# Patient Record
Sex: Male | Born: 1950 | Race: Black or African American | Hispanic: No | Marital: Single | Smoking: Current every day smoker
Health system: Southern US, Community
[De-identification: ages and names within clinical notes are randomized; demographics above are authoritative.]

## PROBLEM LIST (undated history)

## (undated) DIAGNOSIS — Z992 Dependence on renal dialysis: Secondary | ICD-10-CM

## (undated) DIAGNOSIS — C649 Malignant neoplasm of unspecified kidney, except renal pelvis: Secondary | ICD-10-CM

## (undated) DIAGNOSIS — D638 Anemia in other chronic diseases classified elsewhere: Secondary | ICD-10-CM

## (undated) DIAGNOSIS — N2581 Secondary hyperparathyroidism of renal origin: Secondary | ICD-10-CM

## (undated) DIAGNOSIS — N289 Disorder of kidney and ureter, unspecified: Secondary | ICD-10-CM

## (undated) DIAGNOSIS — C7951 Secondary malignant neoplasm of bone: Secondary | ICD-10-CM

## (undated) DIAGNOSIS — Z923 Personal history of irradiation: Secondary | ICD-10-CM

## (undated) DIAGNOSIS — S32599A Other specified fracture of unspecified pubis, initial encounter for closed fracture: Secondary | ICD-10-CM

## (undated) DIAGNOSIS — I10 Essential (primary) hypertension: Secondary | ICD-10-CM

## (undated) HISTORY — DX: Other specified fracture of unspecified pubis, initial encounter for closed fracture: S32.599A

## (undated) HISTORY — DX: Malignant neoplasm of unspecified kidney, except renal pelvis: C64.9

## (undated) HISTORY — PX: AV FISTULA PLACEMENT: SHX1204

## (undated) HISTORY — DX: Secondary malignant neoplasm of bone: C79.51

---

## 1998-01-01 ENCOUNTER — Other Ambulatory Visit: Admission: RE | Admit: 1998-01-01 | Discharge: 1998-01-01 | Payer: Self-pay | Admitting: *Deleted

## 1998-01-08 ENCOUNTER — Other Ambulatory Visit: Admission: RE | Admit: 1998-01-08 | Discharge: 1998-01-08 | Payer: Self-pay | Admitting: *Deleted

## 1998-01-15 ENCOUNTER — Other Ambulatory Visit: Admission: RE | Admit: 1998-01-15 | Discharge: 1998-01-15 | Payer: Self-pay | Admitting: *Deleted

## 1998-02-25 ENCOUNTER — Ambulatory Visit (HOSPITAL_COMMUNITY): Admission: RE | Admit: 1998-02-25 | Discharge: 1998-02-25 | Payer: Self-pay | Admitting: Nephrology

## 1998-03-24 ENCOUNTER — Other Ambulatory Visit: Admission: RE | Admit: 1998-03-24 | Discharge: 1998-03-24 | Payer: Self-pay | Admitting: *Deleted

## 1998-05-23 ENCOUNTER — Observation Stay (HOSPITAL_COMMUNITY): Admission: AD | Admit: 1998-05-23 | Discharge: 1998-05-23 | Payer: Self-pay | Admitting: *Deleted

## 1998-10-04 ENCOUNTER — Encounter: Payer: Self-pay | Admitting: *Deleted

## 1998-10-04 ENCOUNTER — Observation Stay (HOSPITAL_COMMUNITY): Admission: EM | Admit: 1998-10-04 | Discharge: 1998-10-04 | Payer: Self-pay | Admitting: Emergency Medicine

## 2002-04-13 ENCOUNTER — Emergency Department (HOSPITAL_COMMUNITY): Admission: EM | Admit: 2002-04-13 | Discharge: 2002-04-13 | Payer: Self-pay | Admitting: Emergency Medicine

## 2002-06-17 ENCOUNTER — Inpatient Hospital Stay (HOSPITAL_COMMUNITY): Admission: EM | Admit: 2002-06-17 | Discharge: 2002-06-22 | Payer: Self-pay | Admitting: Emergency Medicine

## 2002-06-17 ENCOUNTER — Encounter: Payer: Self-pay | Admitting: Emergency Medicine

## 2002-06-18 ENCOUNTER — Encounter: Payer: Self-pay | Admitting: Nephrology

## 2002-06-19 ENCOUNTER — Encounter: Payer: Self-pay | Admitting: Cardiology

## 2002-06-20 ENCOUNTER — Encounter: Payer: Self-pay | Admitting: Nephrology

## 2002-07-29 ENCOUNTER — Inpatient Hospital Stay (HOSPITAL_COMMUNITY): Admission: EM | Admit: 2002-07-29 | Discharge: 2002-08-01 | Payer: Self-pay | Admitting: *Deleted

## 2002-07-29 ENCOUNTER — Encounter: Payer: Self-pay | Admitting: *Deleted

## 2002-07-30 ENCOUNTER — Encounter: Payer: Self-pay | Admitting: Nephrology

## 2002-08-06 ENCOUNTER — Emergency Department (HOSPITAL_COMMUNITY): Admission: EM | Admit: 2002-08-06 | Discharge: 2002-08-06 | Payer: Self-pay | Admitting: *Deleted

## 2002-08-29 ENCOUNTER — Encounter: Payer: Self-pay | Admitting: Emergency Medicine

## 2002-08-29 ENCOUNTER — Emergency Department (HOSPITAL_COMMUNITY): Admission: EM | Admit: 2002-08-29 | Discharge: 2002-08-29 | Payer: Self-pay | Admitting: Emergency Medicine

## 2002-09-27 ENCOUNTER — Encounter: Admission: RE | Admit: 2002-09-27 | Discharge: 2002-09-27 | Payer: Self-pay | Admitting: Nephrology

## 2002-09-27 ENCOUNTER — Encounter: Payer: Self-pay | Admitting: Nephrology

## 2002-09-28 ENCOUNTER — Emergency Department (HOSPITAL_COMMUNITY): Admission: EM | Admit: 2002-09-28 | Discharge: 2002-09-28 | Payer: Self-pay | Admitting: Emergency Medicine

## 2002-09-28 ENCOUNTER — Encounter: Payer: Self-pay | Admitting: Emergency Medicine

## 2003-05-02 ENCOUNTER — Ambulatory Visit (HOSPITAL_COMMUNITY): Admission: RE | Admit: 2003-05-02 | Discharge: 2003-05-02 | Payer: Self-pay | Admitting: Vascular Surgery

## 2004-02-18 ENCOUNTER — Encounter (INDEPENDENT_AMBULATORY_CARE_PROVIDER_SITE_OTHER): Payer: Self-pay | Admitting: Specialist

## 2004-02-18 ENCOUNTER — Inpatient Hospital Stay (HOSPITAL_COMMUNITY): Admission: AD | Admit: 2004-02-18 | Discharge: 2004-02-22 | Payer: Self-pay | Admitting: General Surgery

## 2004-04-04 ENCOUNTER — Emergency Department (HOSPITAL_COMMUNITY): Admission: EM | Admit: 2004-04-04 | Discharge: 2004-04-05 | Payer: Self-pay | Admitting: Emergency Medicine

## 2004-07-19 ENCOUNTER — Inpatient Hospital Stay (HOSPITAL_COMMUNITY): Admission: EM | Admit: 2004-07-19 | Discharge: 2004-08-01 | Payer: Self-pay | Admitting: Emergency Medicine

## 2004-07-20 ENCOUNTER — Encounter: Payer: Self-pay | Admitting: Cardiology

## 2004-07-30 ENCOUNTER — Encounter (INDEPENDENT_AMBULATORY_CARE_PROVIDER_SITE_OTHER): Payer: Self-pay | Admitting: *Deleted

## 2004-08-23 ENCOUNTER — Encounter: Admission: RE | Admit: 2004-08-23 | Discharge: 2004-08-23 | Payer: Self-pay | Admitting: General Surgery

## 2005-01-24 IMAGING — CT CT ABDOMEN W/ CM
1 of 2 series · 14 of 32 positions shown, 18 images · IV contrast (GASTROGRAFIN & [ID] OMNI 300)
Comparison: none

CLINICAL DATA: Post cholecystectomy.  Episode of pancreatitis.  Evaluate for possible abdominal mass. 
 CT ABDOMEN WITH CONTRAST:
TECHNIQUE: Multidetector helical scans through the abdomen were performed after oral and IV contrast media were given.  100 cc of Omnipaque 300 were given as the contrast media.

[Series 2: a&p w/ · axial · 0.59mm/px · z∈[-405,-45]mm · 14 of 80 slices shown, 18 images]
[im 4/80  soft-tissue]
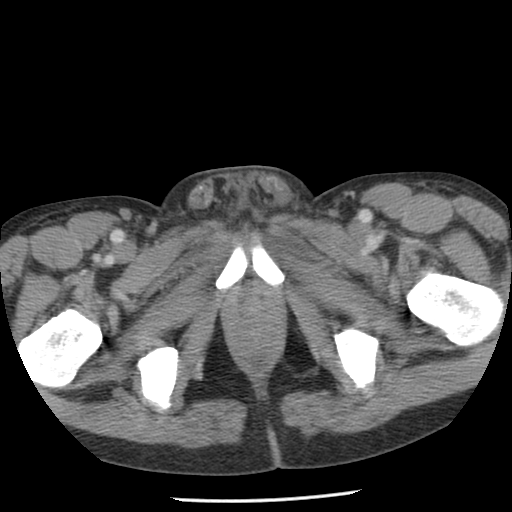
[im 4/80  bone]
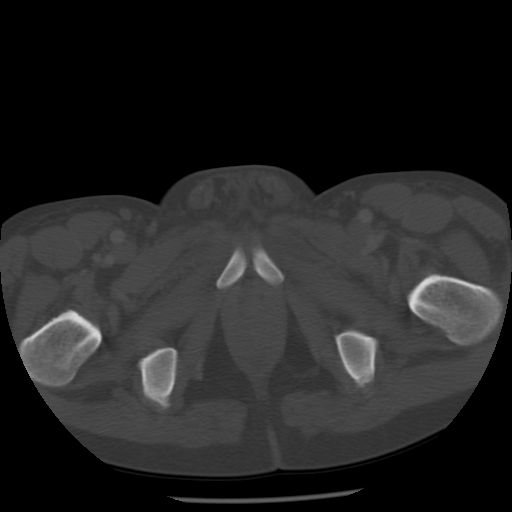
[im 11/80  soft-tissue]
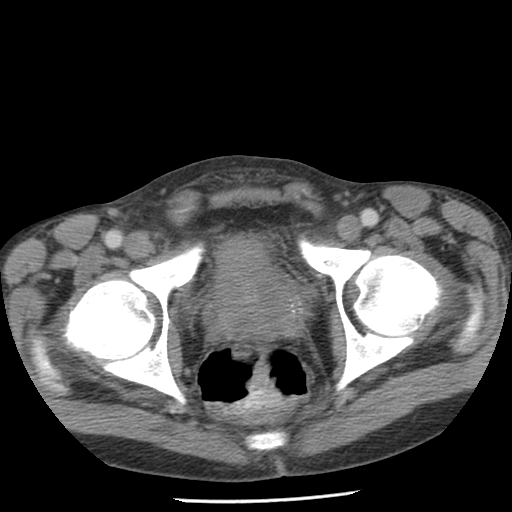
[im 18/80  soft-tissue]
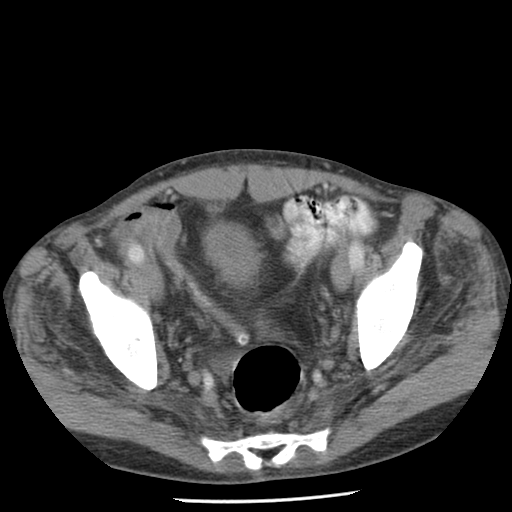
[im 25/80  soft-tissue]
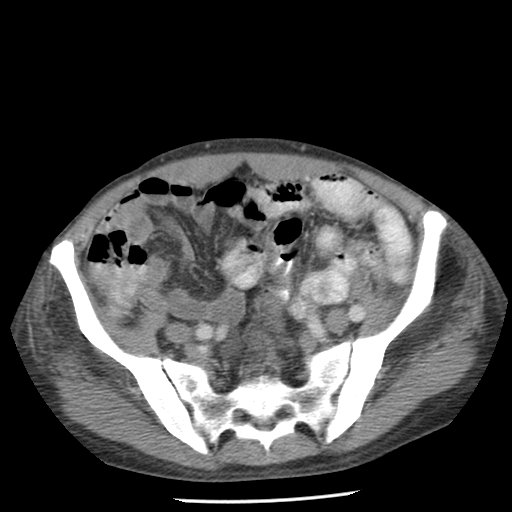
[im 31/80  soft-tissue]
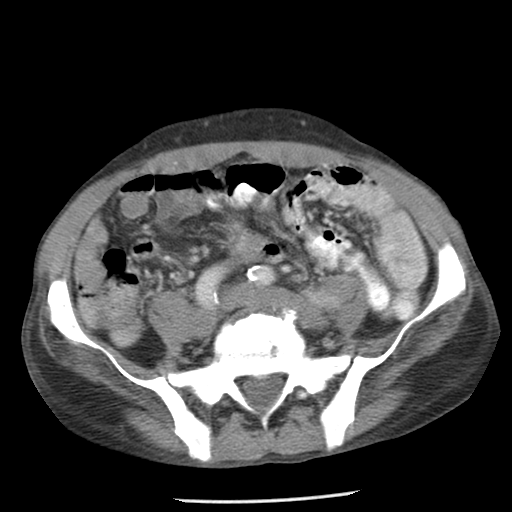
[im 38/80  soft-tissue]
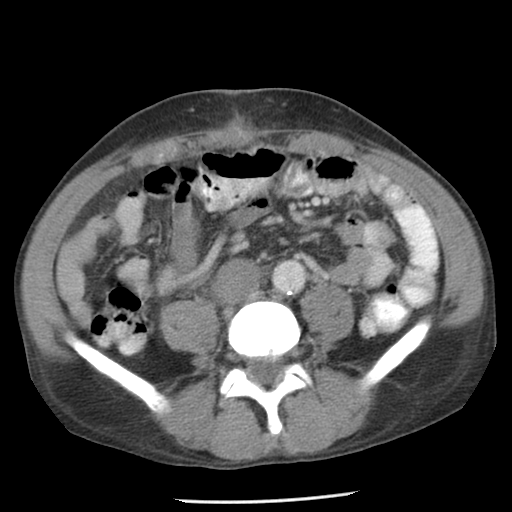
[im 42/80  soft-tissue]
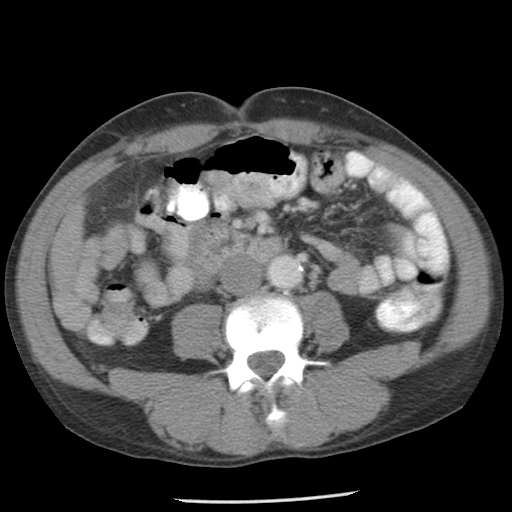
[im 49/80  soft-tissue]
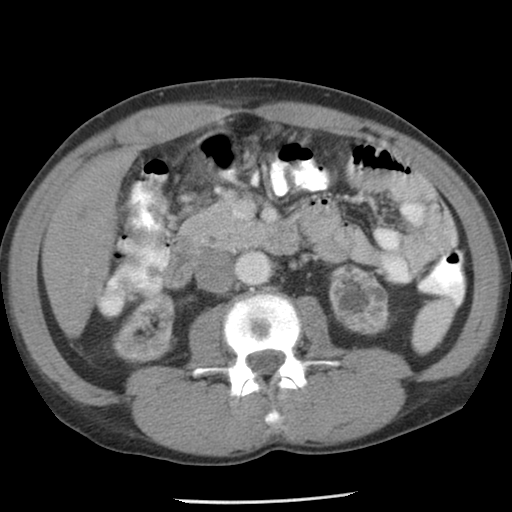
[im 55/80  soft-tissue]
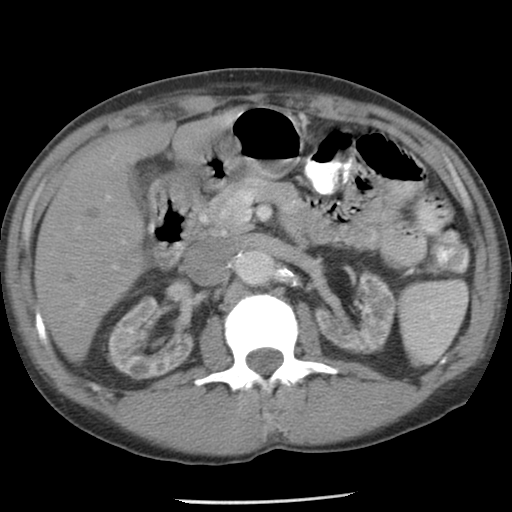
[im 55/80  bone]
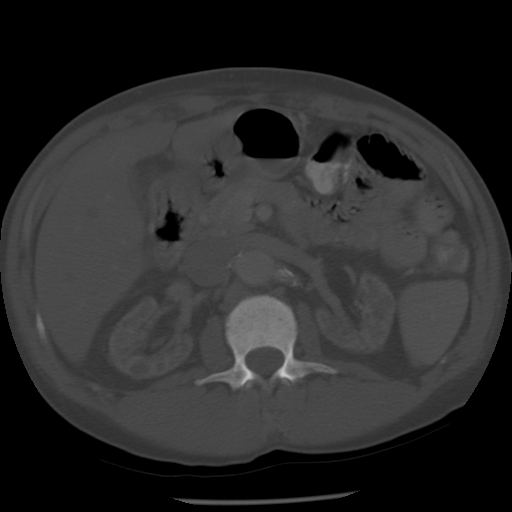
[im 62/80  soft-tissue]
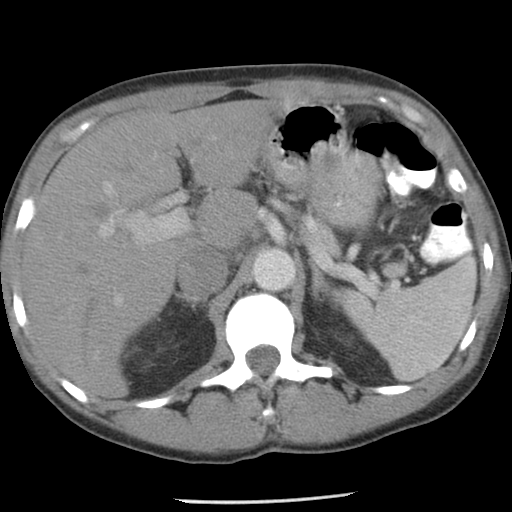
[im 66/80  lung]
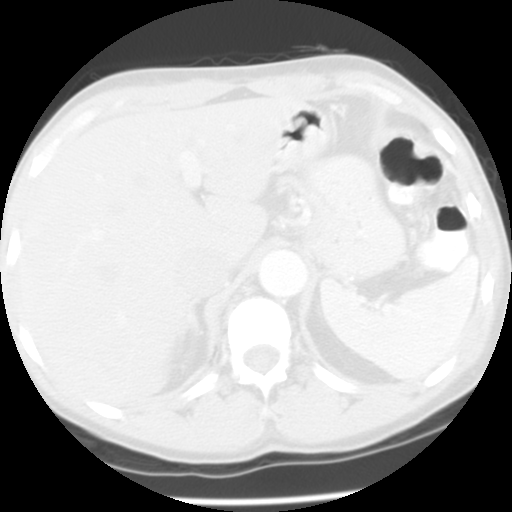
[im 69/80  soft-tissue]
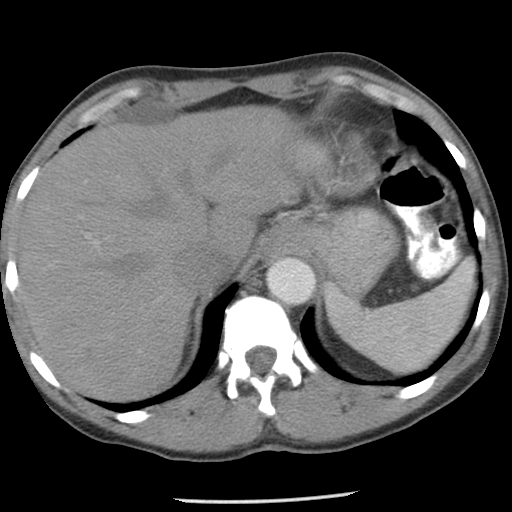
[im 69/80  lung]
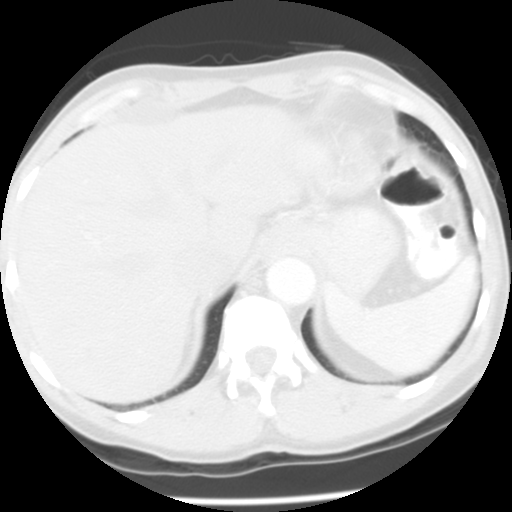
[im 73/80  lung]
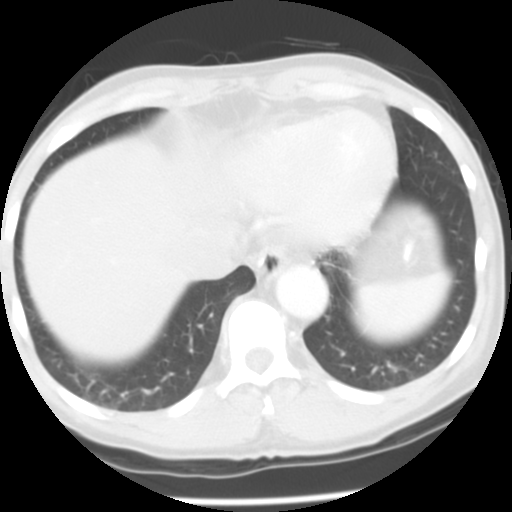
[im 76/80  soft-tissue]
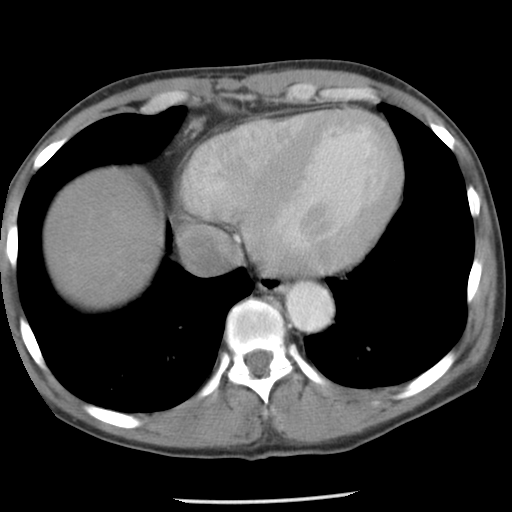
[im 76/80  lung]
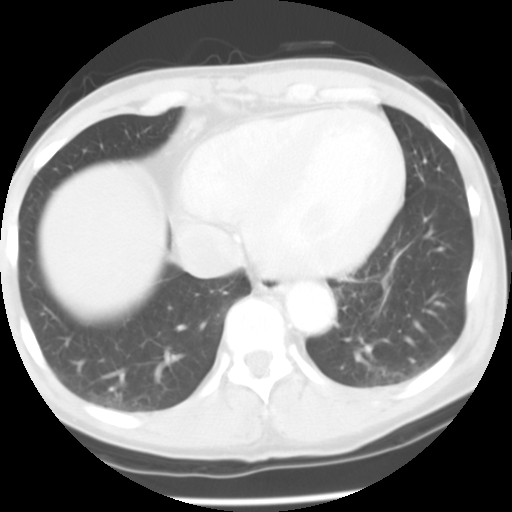

[14 of 32 positions shown; findings below may reference images not displayed]

The lung bases are clear.  There is a ununited old fracture involving a posterior left lower rib.  The heart is within the upper limits of normal.  The liver enhances normally with no focal abnormality and no ductal dilatation is seen.  There is a small fluid collection just anterior to the right lobe of liver of questionable significance.   No air is seen within this small amount of fluid which may be free within the peritoneal cavity. Surgical clips are present from prior cholecystectomy which was by history performed one month ago.  The pancreas is normal in size, and the pancreatic duct is not dilated.  The peripancreatic fat planes appear normal.  The adrenal glands are normal in size as is the spleen. The kidneys are small with parenchymal loss bilaterally as well as multiple renal cysts bilaterally.  No hydronephrosis is seen.  The abdominal aorta is normal in caliber.   There is a rounded area of higher attenuation within the right rectus abdominal muscle most consistent with a hematoma.  Clinical correlation is recommended.  No other evidence of abdominal mass is seen.
IMPRESSION: 1.  Rounded area of higher attenuation within the right rectus muscle may represent hematoma.  Correlate clinically.  No intra-abdominal mass is seen.
 2.    Small amount of fluid anterior to the liver of questionable significance.
 3.  Ununited old left posterior rib fracture.
 4.  Small kidneys with parenchymal loss and multiple cysts bilaterally.  

 CT PELVIS WITH CONTRAST:
 Scans were continued through the pelvis after oral and IV contrast media were given.  The iliac arteries are very tortuous.  There is a small amount of fluid within the pelvis.  The urinary bladder is nondistended in this renal failure patient, but does appear somewhat thick-walled.  A bladder lesion cannot be excluded with this exam.  There is some higher attenuation material within the bladder, and further evaluation of the urinary bladder may be warranted.
IMPRESSION: 1.  Nondistended and thick-walled urinary bladder.  I cannot exclude a bladder lesion.  With higher attenuation within the bladder, further evaluation may be warranted.
 2.  Small amount of fluid layering in the pelvis.

## 2005-01-25 ENCOUNTER — Inpatient Hospital Stay (HOSPITAL_COMMUNITY): Admission: EM | Admit: 2005-01-25 | Discharge: 2005-01-26 | Payer: Self-pay | Admitting: Emergency Medicine

## 2005-01-25 ENCOUNTER — Ambulatory Visit: Payer: Self-pay | Admitting: Internal Medicine

## 2005-06-13 ENCOUNTER — Inpatient Hospital Stay (HOSPITAL_COMMUNITY): Admission: EM | Admit: 2005-06-13 | Discharge: 2005-06-15 | Payer: Self-pay | Admitting: Emergency Medicine

## 2005-06-28 ENCOUNTER — Inpatient Hospital Stay (HOSPITAL_COMMUNITY): Admission: AD | Admit: 2005-06-28 | Discharge: 2005-07-01 | Payer: Self-pay | Admitting: Nephrology

## 2005-07-02 ENCOUNTER — Emergency Department (HOSPITAL_COMMUNITY): Admission: EM | Admit: 2005-07-02 | Discharge: 2005-07-02 | Payer: Self-pay | Admitting: Emergency Medicine

## 2005-07-04 ENCOUNTER — Ambulatory Visit (HOSPITAL_COMMUNITY): Admission: RE | Admit: 2005-07-04 | Discharge: 2005-07-04 | Payer: Self-pay | Admitting: Vascular Surgery

## 2005-07-11 ENCOUNTER — Encounter: Payer: Self-pay | Admitting: Emergency Medicine

## 2005-07-11 ENCOUNTER — Inpatient Hospital Stay (HOSPITAL_COMMUNITY): Admission: EM | Admit: 2005-07-11 | Discharge: 2005-07-14 | Payer: Self-pay | Admitting: Emergency Medicine

## 2005-08-02 ENCOUNTER — Inpatient Hospital Stay (HOSPITAL_COMMUNITY): Admission: EM | Admit: 2005-08-02 | Discharge: 2005-08-04 | Payer: Self-pay | Admitting: Emergency Medicine

## 2005-09-19 ENCOUNTER — Ambulatory Visit (HOSPITAL_COMMUNITY): Admission: RE | Admit: 2005-09-19 | Discharge: 2005-09-19 | Payer: Self-pay | Admitting: Vascular Surgery

## 2005-12-02 ENCOUNTER — Ambulatory Visit (HOSPITAL_COMMUNITY): Admission: RE | Admit: 2005-12-02 | Discharge: 2005-12-02 | Payer: Self-pay | Admitting: Nephrology

## 2006-03-01 ENCOUNTER — Ambulatory Visit (HOSPITAL_COMMUNITY): Admission: RE | Admit: 2006-03-01 | Discharge: 2006-03-01 | Payer: Self-pay | Admitting: Nephrology

## 2006-06-03 ENCOUNTER — Inpatient Hospital Stay (HOSPITAL_COMMUNITY): Admission: EM | Admit: 2006-06-03 | Discharge: 2006-06-04 | Payer: Self-pay | Admitting: Emergency Medicine

## 2006-06-03 ENCOUNTER — Ambulatory Visit: Payer: Self-pay | Admitting: *Deleted

## 2006-06-04 ENCOUNTER — Encounter: Payer: Self-pay | Admitting: Internal Medicine

## 2006-06-04 ENCOUNTER — Emergency Department (HOSPITAL_COMMUNITY): Admission: EM | Admit: 2006-06-04 | Discharge: 2006-06-04 | Payer: Self-pay | Admitting: Emergency Medicine

## 2006-06-14 ENCOUNTER — Ambulatory Visit: Payer: Self-pay

## 2006-06-26 ENCOUNTER — Emergency Department (HOSPITAL_COMMUNITY): Admission: EM | Admit: 2006-06-26 | Discharge: 2006-06-26 | Payer: Self-pay | Admitting: Emergency Medicine

## 2006-12-04 ENCOUNTER — Emergency Department (HOSPITAL_COMMUNITY): Admission: EM | Admit: 2006-12-04 | Discharge: 2006-12-04 | Payer: Self-pay | Admitting: Emergency Medicine

## 2008-08-11 ENCOUNTER — Ambulatory Visit: Payer: Self-pay | Admitting: Surgery

## 2008-08-14 ENCOUNTER — Ambulatory Visit: Payer: Self-pay | Admitting: Surgery

## 2008-08-14 ENCOUNTER — Ambulatory Visit (HOSPITAL_COMMUNITY): Admission: RE | Admit: 2008-08-14 | Discharge: 2008-08-14 | Payer: Self-pay | Admitting: Surgery

## 2008-08-22 ENCOUNTER — Ambulatory Visit (HOSPITAL_COMMUNITY): Admission: RE | Admit: 2008-08-22 | Discharge: 2008-08-22 | Payer: Self-pay | Admitting: Surgery

## 2008-11-11 ENCOUNTER — Ambulatory Visit (HOSPITAL_COMMUNITY): Admission: RE | Admit: 2008-11-11 | Discharge: 2008-11-11 | Payer: Self-pay | Admitting: Nephrology

## 2009-11-19 ENCOUNTER — Emergency Department (HOSPITAL_COMMUNITY): Admission: EM | Admit: 2009-11-19 | Discharge: 2009-11-19 | Payer: Self-pay | Admitting: Emergency Medicine

## 2009-11-19 ENCOUNTER — Ambulatory Visit: Payer: Self-pay | Admitting: Vascular Surgery

## 2009-12-02 ENCOUNTER — Encounter: Admission: RE | Admit: 2009-12-02 | Discharge: 2009-12-02 | Payer: Self-pay | Admitting: Nephrology

## 2010-10-20 ENCOUNTER — Ambulatory Visit
Admission: RE | Admit: 2010-10-20 | Discharge: 2010-10-20 | Payer: Self-pay | Source: Home / Self Care | Attending: Vascular Surgery | Admitting: Vascular Surgery

## 2010-10-22 ENCOUNTER — Ambulatory Visit (HOSPITAL_COMMUNITY)
Admission: RE | Admit: 2010-10-22 | Discharge: 2010-10-22 | Payer: Self-pay | Source: Home / Self Care | Attending: Vascular Surgery | Admitting: Vascular Surgery

## 2010-10-24 ENCOUNTER — Encounter: Payer: Self-pay | Admitting: Nephrology

## 2010-10-25 LAB — POCT I-STAT 4, (NA,K, GLUC, HGB,HCT)
Glucose, Bld: 95 mg/dL (ref 70–99)
Hemoglobin: 18 g/dL — ABNORMAL HIGH (ref 13.0–17.0)
Sodium: 138 mEq/L (ref 135–145)

## 2010-10-26 NOTE — Op Note (Signed)
NAME:  Lance, Waller               ACCOUNT NO.:  [id number]  MEDICAL RECORD NO.:  [id number]          PATIENT TYPE:  AMB  LOCATION:  SDS                          FACILITY:  [facility]  PHYSICIAN:  Di Kindle. 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  PROCEDURE:  Revision of left upper arm AV fistula.  SURGEON:  Di Kindle. Edilia Bo, MD  ASSISTANT:  Nurse.  ANESTHESIA:  Local with sedation.  INDICATIONS:  This is a 60 year old gentleman who had a left upper arm AV fistula for several years.  He had revision of the fistula with an interposition segment of PTFE graft approximately a year ago for an aneurysm.  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  This is a large aneurysm with an eschar with significant risk of bleeding.  He is therefore brought in for revision of the fistula with the plan to save enough fistula above the revision and enough access below the revision to allow continued access with this graft.  TECHNIQUE:  The patient was taken to the operating room and was sedated by anesthesia.  Left upper extremity was prepped and draped in the usual sterile fashion.  After the skin was infiltrated with 1% lidocaine, a transverse incision was made below the aneurysm where the previously revised graft was dissected free.  Again, after the skin was anesthetized with 1% lidocaine above the aneurysm, the AV fistula was dissected free.  Tunnel was then created between these two incisions and a 7-mm PTFE graft was tunneled between the two incisions and the patient was then heparinized.  The graft distally was clamped and divided.  It was slightly spatulated.  The 7-mm graft was slightly  spatulated the two were sewn end-to-end with continuous 6-0 Prolene suture.  Next, the Fogarty catheter was passed proximally multiple times and there was no clot retrieved.  This was then flushed with heparinized saline and clamped.  Next, at the venous end, the vein was patent.  I passed a Fogarty catheter multiple times, and there was no problem distally.  The new segment graft was cut to the appropriate length and sewn end-to-end to the vein using continuous 6-0 Prolene suture.  At the completion, there was a good thrill in the graft.  There was enough room to access the graft above the revision and also below the revision.  I then excised the large aneurysm with the eschar using an elliptical incision. This incision was closed with interrupted 3-0 nylons.  The incision for the revision were closed with a deep layer of 3-0 Vicryl and the skin closed with 4-0 Vicryl.  Sterile dressing was applied.  The patient tolerated the procedure well, was transferred to the recovery room in stable condition.  All needle and sponge counts were correct.     Di Kindle. Edilia Bo, M.D.     CSD/MEDQ  D:  10/22/2010  T:  10/23/2010  Job:  [id number]  Electronically Signed by Waverly Ferrari M.D. on 10/26/2010 03:26:05 PM

## 2010-11-07 ENCOUNTER — Inpatient Hospital Stay (INDEPENDENT_AMBULATORY_CARE_PROVIDER_SITE_OTHER)
Admission: RE | Admit: 2010-11-07 | Discharge: 2010-11-07 | Disposition: A | Discharge status: Home / Self Care | Payer: Medicare Other | Source: Ambulatory Visit | Attending: Family Medicine | Admitting: Family Medicine

## 2010-11-07 DIAGNOSIS — T82898A Other specified complication of vascular prosthetic devices, implants and grafts, initial encounter: Secondary | ICD-10-CM

## 2010-11-10 ENCOUNTER — Ambulatory Visit (INDEPENDENT_AMBULATORY_CARE_PROVIDER_SITE_OTHER): Payer: Medicare Other | Admitting: Vascular Surgery

## 2010-11-10 DIAGNOSIS — T82898A Other specified complication of vascular prosthetic devices, implants and grafts, initial encounter: Secondary | ICD-10-CM

## 2010-11-10 DIAGNOSIS — N186 End stage renal disease: Secondary | ICD-10-CM

## 2010-11-12 ENCOUNTER — Ambulatory Visit (INDEPENDENT_AMBULATORY_CARE_PROVIDER_SITE_OTHER): Payer: Medicare Other

## 2010-11-12 DIAGNOSIS — N186 End stage renal disease: Secondary | ICD-10-CM

## 2010-11-12 DIAGNOSIS — T82898A Other specified complication of vascular prosthetic devices, implants and grafts, initial encounter: Secondary | ICD-10-CM

## 2010-11-12 NOTE — Assessment & Plan Note (Signed)
OFFICE VISIT  SAJJAD, HONEA DOB:  11-13-1950                                       11/12/2010 EAVWU#:[id number]  The date of surgery was 10/22/2010.  The patient is a 59 year old African American male who was recently seen on 11/10/2010 by Dr. Edilia Bo in the office for evaluation of an open wound in the left upper arm. The patient has a left upper arm AV fistula that has been in place for several years.  He had previously undergone revision of the fistula with graft approximately a year ago.  He developed an aneurysm central to the previous revision with an eschar with significant risk for bleeding.  On January 20 Dr. Edilia Bo took him to the OR and bypassed around the aneurysm and excised the aneurysm eschar.  It is this area that the wound had opened and Dr. Edilia Bo evaluated in the office on 11/10/2010. The wound was irrigated and debrided and he was instructed to pack it daily with wet-to-dry dressings.  There was no evidence of infection and therefore the patient was instructed to follow up in one week.  The home health nursing was set up and the patient's wound has been packed according to home health.  The home health nurse did call the office today with concerns of possible infection and "foul odor" from the wound.  PHYSICAL EXAMINATION:  Vital signs:  Temperature 97.9, blood pressure 112/77, heart rate 75.  The wound is inspected and there is some granulation present.  The wound edges are clean.  There is no evidence of erythema or significant drainage.  There is tunneling present at the 10 o'clock and 12 o'clock positions in the wound.  There is no foul odor.  Of note, when the nurse took the dressing down to expose the wound she stated that it was packed with cotton balls.  We are not certain who did that.  The patient states when the home health is not there on the weekends that his niece packs the wound for him.  He is still going to dialysis  on Tuesday, Thursday, Saturday and the fistula is being cannulated on those days.  Secondary to the fistula being utilized on a regular basis and being cannulated on a regular basis I placed the patient on a 7 day course of Keflex.  I also changed the dressing to quarter inch iodoform gauze to be packed once a day with a dry dressing over the top.  This was changed secondary to the tunneling present and the ease of being able to pack the iodoform gauze into the tunnels.  The patient is to call with any new questions, issues or problems.  Otherwise he will keep his scheduled followup with Dr. Edilia Bo.  Pecola Leisure, PA  Di Kindle. Edilia Bo, M.D. Electronically Signed  AY/MEDQ  D:  11/12/2010  T:  11/12/2010  Job:  [id number]

## 2010-11-12 NOTE — Assessment & Plan Note (Signed)
OFFICE VISIT  GLENNON, KOPKO DOB:  12/09/1950                                       11/10/2010 EAVWU#:[id number]  I saw the patient in the office today with a wound in his left upper arm.  This is a 60 year old gentleman who had a left upper arm AV fistula for several years.  He had previously had a revision of the fistula with an interposition segment PTFE graft approximately a year ago.  He developed another aneurysm central to the previous revision with an eschar over this with significant risk for bleeding.  On 10/22/2010, I took him to the OR and bypassed around this large aneurysm with the eschar and then excised the aneurysm and the area with the eschar.  He comes in today because he has no open wound where the aneurysm was resected.  On examination, temperature is 98.2, blood pressure 127/80, heart rate is 81.  The fistula has an excellent bruit and thrill and is functioning nicely.  The area over the aneurysm excised, has separated some and this was washed out and then packed wet-to-dry.  He will need wet-to-dry dressing changes daily and I will plan on seeing him back in 1 week.  The graft is lateral to this area of the wound so hopefully we can get this area to heal and still salvage this graft.  Infected aneurysm.    Di Kindle. Edilia Bo, M.D. Electronically Signed  CSD/MEDQ  D:  11/10/2010  T:  11/11/2010  Job:  [id number]  cc:   [facility]

## 2010-11-17 ENCOUNTER — Ambulatory Visit: Payer: Medicare Other | Admitting: Vascular Surgery

## 2010-12-01 ENCOUNTER — Ambulatory Visit (INDEPENDENT_AMBULATORY_CARE_PROVIDER_SITE_OTHER): Payer: Medicare Other | Admitting: Vascular Surgery

## 2010-12-01 DIAGNOSIS — N186 End stage renal disease: Secondary | ICD-10-CM

## 2010-12-02 NOTE — Assessment & Plan Note (Signed)
OFFICE VISIT  DAMARIEN, NYMAN DOB:  September 09, 1951                                       12/01/2010 [id number]#:[id number]  I saw the patient for continued follow-up of his left arm wound.  He had a left upper arm fistula revised on 10/22/2010.  He had an aneurysm excised, then developed a wound at the site of his old aneurysm.  This has been packed and he comes in for a routine wound check.  This has continued to granulate in nicely.  There has been no drainage or evidence of infection.  The graft has been working well and currently it takes minimal packing.  We will have the home health nurse continue his dressing changes and I will see him back in 3 weeks and at that point I think it will have completely healed.    Di Kindle. Edilia Bo, M.D. Electronically Signed  CSD/MEDQ  D:  12/01/2010  T:  12/02/2010  Job:  [id number]

## 2010-12-06 ENCOUNTER — Ambulatory Visit (HOSPITAL_COMMUNITY)
Admission: RE | Admit: 2010-12-06 | Discharge: 2010-12-06 | Disposition: A | Discharge status: Home / Self Care | Payer: Medicare Other | Source: Ambulatory Visit | Attending: Nephrology | Admitting: Nephrology

## 2010-12-06 ENCOUNTER — Other Ambulatory Visit (HOSPITAL_COMMUNITY): Payer: Self-pay | Admitting: Nephrology

## 2010-12-06 DIAGNOSIS — N186 End stage renal disease: Secondary | ICD-10-CM

## 2010-12-06 DIAGNOSIS — Z452 Encounter for adjustment and management of vascular access device: Secondary | ICD-10-CM | POA: Insufficient documentation

## 2010-12-06 LAB — BASIC METABOLIC PANEL
BUN: 77 mg/dL — ABNORMAL HIGH (ref 6–23)
Creatinine, Ser: 14.61 mg/dL — ABNORMAL HIGH (ref 0.4–1.5)
GFR calc Af Amer: 4 mL/min — ABNORMAL LOW (ref >60)
GFR calc non Af Amer: 3 mL/min — ABNORMAL LOW (ref >60)
Potassium: 5.2 mEq/L — ABNORMAL HIGH (ref 3.5–5.1)

## 2010-12-07 ENCOUNTER — Ambulatory Visit: Payer: Medicare Other | Admitting: Vascular Surgery

## 2010-12-22 ENCOUNTER — Ambulatory Visit (INDEPENDENT_AMBULATORY_CARE_PROVIDER_SITE_OTHER): Payer: Medicare Other | Admitting: Vascular Surgery

## 2010-12-22 ENCOUNTER — Encounter (INDEPENDENT_AMBULATORY_CARE_PROVIDER_SITE_OTHER): Payer: Medicare Other

## 2010-12-22 DIAGNOSIS — N186 End stage renal disease: Secondary | ICD-10-CM

## 2010-12-22 DIAGNOSIS — Z01810 Encounter for preprocedural cardiovascular examination: Secondary | ICD-10-CM

## 2010-12-22 LAB — BASIC METABOLIC PANEL
Calcium: 8.5 mg/dL (ref 8.4–10.5)
Creatinine, Ser: 5.32 mg/dL — ABNORMAL HIGH (ref 0.4–1.5)
GFR calc Af Amer: 14 mL/min — ABNORMAL LOW (ref >60)
Sodium: 143 mEq/L (ref 135–145)

## 2010-12-22 LAB — DIFFERENTIAL
Lymphocytes Relative: 24 % (ref 12–46)
Lymphs Abs: 2 10*3/uL (ref 0.7–4.0)
Monocytes Relative: 12 % (ref 3–12)
Neutro Abs: 5.2 10*3/uL (ref 1.7–7.7)
Neutrophils Relative %: 61 % (ref 43–77)

## 2010-12-22 LAB — CBC
Hemoglobin: 11.9 g/dL — ABNORMAL LOW (ref 13.0–17.0)
RBC: 4.24 MIL/uL (ref 4.22–5.81)
WBC: 8.4 10*3/uL (ref 4.0–10.5)

## 2010-12-22 LAB — APTT: aPTT: 30 seconds (ref 24–37)

## 2010-12-22 LAB — PROTIME-INR: INR: 0.99 (ref 0.00–1.49)

## 2010-12-23 NOTE — Assessment & Plan Note (Signed)
OFFICE VISIT  GIBBS, NAUGLE DOB:  [birth year]                                       12/22/2010 [id number]#:[id number]  I saw the patient in the office today to follow up his left arm wound. He had a left upper arm AV fistula for several years.  He had undergone a revision previously for aneurysm with a segment interposition PTFE. He developed a subsequent aneurysm which eroded through the skin and required another revision with PTFE.  He developed a wound which had been gradually healing.  He returned to have his wound checked and tells me that his graft clotted, and he had a catheter placed by Interventional Radiology.  It was felt that there were no further options to salvage the left upper arm access.  He has had no fever or chills.  On physical examination, this is a pleasant 60 year old gentleman who appears his stated age.  Temperature is 98.2.  Blood pressure 116/85, heart rate is 80.  His upper arm access now is occluded.  He has a palpable brachial and radial pulse bilaterally.  We mapped his remaining veins in both arms.  The upper arm cephalic vein on the right is quite small with diameters ranging from 0.12 cm to 0.24 cm.  The right basilic vein has sizes ranging from 0.27 to 0.55 cm.  The upper arm cephalic vein is occluded.  The basilic vein on the left has diameters ranging from 0.3 to 0.4 cm.  It appears that his best option for a fistula would be a basilic vein transposition on the right if this vein was found be adequate.  If this is not adequate, we could also examine at least the upper arm cephalic vein on the right.  If neither are adequate, he would have to have an AV graft placement in the right arm.  He is a Tuesday, Thursday, Saturday dialysis and we would schedule this for December 24, 2010.    Di Kindle. Edilia Bo, M.D. Electronically Signed  CSD/MEDQ  D:  12/22/2010  T:  12/23/2010  Job:  [id number]  cc:   [facility]

## 2010-12-24 ENCOUNTER — Ambulatory Visit (HOSPITAL_COMMUNITY)
Admission: RE | Admit: 2010-12-24 | Discharge: 2010-12-24 | Disposition: A | Discharge status: Home / Self Care | Payer: Medicare Other | Source: Ambulatory Visit | Attending: Vascular Surgery | Admitting: Vascular Surgery

## 2010-12-24 ENCOUNTER — Ambulatory Visit (HOSPITAL_COMMUNITY): Payer: Medicare Other

## 2010-12-24 DIAGNOSIS — Z992 Dependence on renal dialysis: Secondary | ICD-10-CM | POA: Insufficient documentation

## 2010-12-24 DIAGNOSIS — N186 End stage renal disease: Secondary | ICD-10-CM

## 2010-12-24 DIAGNOSIS — F17200 Nicotine dependence, unspecified, uncomplicated: Secondary | ICD-10-CM | POA: Insufficient documentation

## 2010-12-24 DIAGNOSIS — Z01812 Encounter for preprocedural laboratory examination: Secondary | ICD-10-CM | POA: Insufficient documentation

## 2010-12-24 DIAGNOSIS — I120 Hypertensive chronic kidney disease with stage 5 chronic kidney disease or end stage renal disease: Secondary | ICD-10-CM | POA: Insufficient documentation

## 2010-12-24 DIAGNOSIS — Z01818 Encounter for other preprocedural examination: Secondary | ICD-10-CM | POA: Insufficient documentation

## 2010-12-24 LAB — SURGICAL PCR SCREEN
MRSA, PCR: NEGATIVE
Staphylococcus aureus: NEGATIVE

## 2010-12-24 LAB — POCT I-STAT 4, (NA,K, GLUC, HGB,HCT)
Hemoglobin: 13.6 g/dL (ref 13.0–17.0)
Sodium: 141 mEq/L (ref 135–145)

## 2011-01-01 NOTE — Procedures (Unsigned)
CEPHALIC VEIN MAPPING  INDICATION:  End-stage renal disease, dialysis access.  HISTORY: Current smoker, hypertension.  EXAM: The right cephalic vein is not compressible from proximal forearm to wrist.  The right cephalic vein is compressible from antecubital fossa to proximal upper arm.  Diameter measurements range from 0.12 to 0.29 cm.  The right basilic vein is compressible.  Diameter measurements range from 0.27 to 0.75 cm.  The left cephalic vein is not compressible.  No diameter measurements obtained.  The left basilic vein is compressible.  Diameter measurements range from 0.15 to 0.40 cm.  See attached worksheet for all measurements.  IMPRESSION: 1. Patent bilateral basilic veins with diameter measurements as     described above. 2. Right cephalic vein thrombosed from proximal forearm to wrist. 3. The left cephalic vein is thrombosed from proximal to distal upper     arm segment.  ___________________________________________ Di Kindle. Edilia Bo, M.D.  SH/MEDQ  D:  12/22/2010  T:  12/22/2010  Job:  [id number]

## 2011-01-04 NOTE — Op Note (Signed)
  NAME:  [name], [place]FF[place] NO.:  [id number]  MEDICAL RECORD NO.:  [id number]           PATIENT TYPE:  O  LOCATION:  SDSC                         FACILITY:  [facility]  PHYSICIAN:  Di Kindle. Edilia Bo, M.D.DATE OF BIRTH:  1951-09-20  DATE OF PROCEDURE:  12/24/2010 DATE OF DISCHARGE:  12/24/2010                              OPERATIVE REPORT   PREOPERATIVE DIAGNOSIS:  Chronic kidney disease.  POSTOPERATIVE DIAGNOSIS:  Chronic kidney disease.  PROCEDURE:  Right basilic vein transposition.  SURGEON:  Di Kindle. Edilia Bo, MD  ASSISTANT:  Pecola Leisure, PA  ANESTHESIA:  General.  TECHNIQUE:  The patient was taken to the operating room and the right upper extremity was prepped and draped in usual sterile fashion.  The vein had been marked with the duplex scanner.  Using four incisions along the medial aspect of the right upper arm, the basilic vein was harvested from beyond the antecubital space up to the axilla.  Branches were brought between clips and 3-0 silk ties.  The vein was fully mobilized.  Through the distal incision, the brachial artery was exposed and had a good pulse.  A tunnel was then created from the distal incision to the axillary incision and the vein was tunneled between the two incisions.  The patient was then heparinized.  The brachial artery was clamped proximally and distally and longitudinal arteriotomy was made.  The vein was spatulated, cut to appropriate length, and sewn end- to-side to the artery using continuous 6-0 Prolene suture.  At completion, there was an excellent thrill in the fistula and a radial signal with a Doppler.  Hemostasis was obtained in the wounds.  The wounds were closed with deep layer of 3-0 Vicryl and skin closed with 4- 0 Vicryl.  Sterile dressing was applied.  The patient tolerated the procedure well, was transferred to the recovery room in stable condition.  All needle and sponge counts were  correct.     Di Kindle. Edilia Bo, M.D.     CSD/MEDQ  D:  12/24/2010  T:  12/25/2010  Job:  [id number]  Electronically Signed by Waverly Ferrari M.D. on 01/04/2011 07:42:04 AM

## 2011-01-06 ENCOUNTER — Ambulatory Visit: Payer: Self-pay

## 2011-02-04 ENCOUNTER — Ambulatory Visit: Payer: Medicare Other

## 2011-02-04 ENCOUNTER — Ambulatory Visit (INDEPENDENT_AMBULATORY_CARE_PROVIDER_SITE_OTHER): Payer: Medicare Other

## 2011-02-04 DIAGNOSIS — N186 End stage renal disease: Secondary | ICD-10-CM

## 2011-02-04 NOTE — Assessment & Plan Note (Signed)
OFFICE VISIT  JAKOLBY, SEDIVY DOB:  1951/05/12                                       02/04/2011 ZOXWR#:[id number]  Dr. Imogene Burn is the attending physician in the office today on 02/04/2011.  CHIEF COMPLAINT:  Postop check right basilic vein transposition done on 12/24/2010.  The patient is a 60 year old gentleman who has been on hemodialysis and has multiple accesses in the left and right arms.  He had a right basilic vein transposition 12/24/2010.  He has been doing well with no signs of steal.  He denies any numbness, tingling or inability to use his right hand.  He states he has a good grip on that side.  PHYSICAL EXAM:  This is a well-developed, well-nourished gentleman in no acute distress.  His O2 sats were 100%.  His heart rate was 71.  His respiratory rate was 15.  He had a good thrill and bruit in the right upper arm basilic vein transposition.  It was easily palpable and he had good and equal strength in bilateral upper extremities.  The patient has a nicely maturing basilic vein transposition which is easily palpable with a good thrill and bruit and no signs of steal. They would be able to access this fistula at the end of June at the 3 month mark and he will follow up with [place] as needed.  Della Goo, PA-C  Fransisco Hertz, MD Electronically Signed  RR/MEDQ  D:  02/04/2011  T:  02/04/2011  Job:  [id number]

## 2011-02-15 NOTE — Op Note (Signed)
NAME:  [name], [name]               ACCOUNT NO.:  [id number]   MEDICAL RECORD NO.:  [id number]          PATIENT TYPE:  AMB   LOCATION:  SDS                          FACILITY:  [facility]   PHYSICIAN:  Juleen China IV, MDDATE OF BIRTH:  Mar 12, 1951   DATE OF PROCEDURE:  08/14/2008  DATE OF DISCHARGE:                               OPERATIVE REPORT   PREOPERATIVE DIAGNOSIS:  Ulceration, left arm fistula.   POSTOPERATIVE DIAGNOSIS:  Ulceration, left arm fistula.   PROCEDURE PERFORMED:  1. Ultrasound-guided access, left cephalic vein.  2. Fistulogram.   COMPLICATIONS:  None.   FINDINGS:  No central stenosis.   PROCEDURE:  The patient was identified in the holding area and taken to  room 8.  He was placed supine on the table.  The left arm was prepped  and draped in standard sterile fashion.  A time-out was called.  Lidocaine 1% was used for local anesthesia.  The left cephalic fistula  was evaluated with ultrasound and found to be easily compressible and  widely patent.  It was accessed under ultrasound guidance with a  micropuncture needle.  An 0.018 mandrel wire was advanced into the  fistula under fluoroscopic visualization.  A 5-French stiff  micropuncture sheath was placed.  Contrast was then injected through the  sheath.   FINDINGS:  The cephalic vein fistula was widely patent.  There was no  evidence of central stenosis.   With these findings, decision was made to terminate the procedure.  Sheath was pulled.  The patient was taken to holding area in stable  condition.   IMPRESSION:  No evidence of central venous stenosis.           ______________________________  V. Charlena Cross, MD  Electronically Signed     VWB/MEDQ  D:  08/14/2008  T:  08/14/2008  Job:  [id number]

## 2011-02-15 NOTE — Assessment & Plan Note (Signed)
OFFICE VISIT   [name], [name]  DOB:  03-Mar-1951                                       08/11/2008  JWJXB#:[id number]   REASON FOR VISIT:  Evaluate pseudoaneurysm.   HISTORY:  This is a 60 year old gentleman whom I am seeing for  evaluation of left upper arm pseudoaneurysm with ulceration, the patient  had a left upper arm AV fistula placed by Dr. Arbie Cookey several years ago.  He now states that he has had a sore with scabbing over a dilated  portion of the graft for several weeks.  He denies having any fevers or  chills.  He is not having significant problems with bleeding at  dialysis.   PHYSICAL EXAMINATION:  Blood pressure 148/78 temperature is 97.  He is  in no acute distress.  The left upper arm fistula has several dilated  areas, the medial one has a ulcerated area of scabbing.  He has a good  thrill within the fistula and a palpable radial pulse.   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   PLAN:  The patient will be scheduled to undergo left upper arm  fistulogram to evaluate for central stenosis as the etiology of his  pseudoaneurysm and ulceration.  If at that time I find a lesion, it will  be treated with balloon venoplasty.  Again, this has been scheduled for  Thursday, November 12.  We will do this as the first case so that he can  make dialysis later that afternoon.   Once we have evaluated and possibly treated his central stenosis, he  will need to have his graft revised.  I am scheduling him for a revision  of his left arm fistula on Friday, November 20.  I plan on potentially  evaluating and putting in an interposition vein graft.  However, most  likely this will require an interposition Gore-Tex graft.  Details of  the procedure discussed with the patient.   Jorge Ny, MD  Electronically Signed   VWB/MEDQ  D:  08/11/2008  T:  08/12/2008  Job:  [id number]   cc:   [facility]

## 2011-02-15 NOTE — Op Note (Signed)
NAME:  [name], [name]               ACCOUNT NO.:  [id number]   MEDICAL RECORD NO.:  [id number]          PATIENT TYPE:  AMB   LOCATION:  SDS                          FACILITY:  [facility]   PHYSICIAN:  Juleen China IV, MDDATE OF BIRTH:  1951/10/03   DATE OF PROCEDURE:  08/22/2008  DATE OF DISCHARGE:  08/22/2008                               OPERATIVE REPORT   PREOPERATIVE DIAGNOSIS:  End-stage renal disease.   POSTOPERATIVE DIAGNOSIS:  End-stage renal disease.   PROCEDURE PERFORMED:  Revision of left upper arm AV fistula with 6-mm  interposition Gore-Tex graft.   ANESTHESIA:  General.   BLOOD LOSS:  Minimal.   FINDINGS:  Ulcerated pseudoaneurysm.   COMPLICATIONS:  None.   INDICATIONS:  This is a 60 year old gentleman who has undergone left  upper arm fistula that has been working well; however, he has developed  ulceration on top of a pseudoaneurysm.  He has undergone a fistulogram,  which shows no evidence of central stenosis.  He comes in today for  resection of this portion of his fistula and placement of interposition  graft.   PROCEDURE:  The patient was identified in the holding area and taken to  room eight.  He was placed supine on the table.  The left arm was  prepped and draped in standard sterile fashion.  A time-out was called,  antibiotics were given.  Transverse incisions were made above the  fistula proximal and distal to the ulcerated pseudoaneurysm.  The vein  was dissected free within each incision and then mobilized proximally  and distally.  A Kelly clamp was used to create a tunnel between the 2  incisions.  The patient was given systemic heparinization.  A 6-mm Gore-  Tex graft was brought through the tunnel.  The fistula was occluded.  The distal anastomosis was done first.  The fistula was  transected.  The excluded portion of the fistula was oversewn with a 5-0 Prolene.  This was done proximally and distally.  Next, an end-to-end anastomosis  was  created between the fistula and the Gore-Tex graft with a 6-0  Prolene.  After completion, 1 repair stitch was required.  It was  hemostatic.  Next, the proximal anastomosis was done.  Again, the  excluded portion of the fistula was oversewn with 5-0 Prolene.  An end-  to-end anastomosis was created between the proximal fistula and the  graft.  Prior to completion, the vein was flushed in antegrade and  retrograde.  The anastomosis was then completed.  There was an excellent  thrill within the fistula as well as within  the interposition graft.  A 25 mg of protamine was given.  Hemostasis  was achieved.  Skin was closed in 2 layers with 3-0 and 4-0 Vicryl.  Dermabond was placed.  The patient tolerated the procedure well.  There  were no complications.           ______________________________  V. Charlena Cross, MD  Electronically Signed     VWB/MEDQ  D:  08/22/2008  T:  08/22/2008  Job:  [id number]

## 2011-02-15 NOTE — Assessment & Plan Note (Signed)
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  He had a left upper arm AV fistula  placed in October 2006.  He later developed an ulceration over the  fistula.  He underwent a fistulogram by Dr. Myra Gianotti on 08/14/2008  which did not demonstrate any central venous stenosis.  In November  2009, he had revision of the fistula by bypassing around the aneurysmal  segment which was ulcerated with a 6 mm interposition Gore-Tex graft.  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  He was sent to have this  evaluated.   PHYSICAL EXAMINATION:  The fistula has a palpable thrill and audible  bruit.  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  He said he has had no problems with bleeding  from it at this point.   I have explained the options are either to place an entirely new graft,  ligate his current graft, and place a Diatek catheter versus trying to  bypass around the aneurysm and still preserved enough of the vein and  graft to allow it to be still cannulate for access.  He would like to  try this approach before electing to place new access.  The new segment  graft that was placed previously can be cannulated and apparently they  have not been cannulating this. I will try to save enough of that for  cannulation here and also possibly some vein centrally where he could be  cannulated.  Will arrange to have this done on Friday.  He dialyzes  Tuesdays, Thursdays and Saturdays.     Di Kindle. Edilia Bo, M.D.  Electronically Signed   CSD/MEDQ  D:  10/20/2010  T:  10/21/2010  Job:  [id number]   cc:   Cecille Aver, M.D.

## 2011-02-18 NOTE — Op Note (Signed)
NAME:  [name], [name] NO.:  [id number]   MEDICAL RECORD NO.:  [id number]                   PATIENT TYPE:  OIB   LOCATION:  2550                                 FACILITY:  [facility]   PHYSICIAN:  Anselm Pancoast. Zachery Dakins, M.D.          DATE OF BIRTH:  [birth year]   DATE OF PROCEDURE:  02/18/2004  DATE OF DISCHARGE:                                 OPERATIVE REPORT   PREOPERATIVE DIAGNOSIS:  Secondary hyperparathyroidism, chronic renal  failure on hemodialysis.   POSTOPERATIVE DIAGNOSIS:  Secondary hyperparathyroidism, chronic renal  failure on hemodialysis.   OPERATION PERFORMED:  Removal of four parathyroid glands and transplant,  left forearm.   SURGEON:  Anselm Pancoast. Zachery Dakins, M.D.   ASSISTANT:  Nurse; Lorne Skeens. Hoxworth, M.D. scrubbed in briefly.   HISTORY:  [name] is a 60 year old male referred to me by Britt Bottom C.  Lowell Guitar, M.D. for secondary hyperparathyroidism.  His parathyroid hormone has  got up to 1000 and he is dialyzed Tuesday, Thursday and Saturday and has  been on hemodialysis approximately I think two or three years.  The patient  is dialyzed through a fistula in the right forearm and we discussed about  removing the parathyroid glands from the neck and then transplanting them to  the left forearm.  He was given a gram of Kefzol.  His potassium was normal  this morning and he was hemodialyzed yesterday.  His calcium levels were not  elevated preoperatively.  It was 8.3.   DESCRIPTION OF PROCEDURE:  The patient was taken to the operative suite.  Induction of general anesthesia.  Endotracheal tube.  He was positioned with  a roll up under his back to protecting his arms so that the fistula is not  being compromised and then a roll up under the back and his neck prepped  with Betadine surgical solution and draped in a sterile manner.  A small  transverse incision about two fingerbreadths above the manubrium was made.  I had marked to it  was probably about a 6 cm incision total and then the  superior and inferior flaps were elevated along the platysma and then the  strap muscles were separated at the midline, identifying the anterior  surface of the left lobe of the thyroid.  I had placed a __________  retractor, retracting the skin and then working from the right side first,  examined the left side first.  Noticed a little area that I thought was a  little thyroid nodule anterior to the gland, not in the retrothyroid area at  all and dissected posterior looking could identify the groove between the  trachea and the esophagus.  Identified the recurrent laryngeal nerve, could  not actually identify anything that I was definitely sure was a parathyroid  gland on the left initially.  I then switched to the opposite side and did a  similar middle vein was divided and ligated with 4-0  Vicryl.  Did not divide  the superior or inferior thyroid arteries and then identified a kind of a  normal sized parathyroid gland that I thought was probably superior in the  posterior lobe area, with rotating it medially and excised this area, sent  about half for a pathology exam and this was a parathyroid gland.  The gland  itself measured about 6 x 6 x about 10 mm in greatest dimensions.  Next, I  switched back to the left side again and the little area that we had noted  all along but anterior and thought it was probably thyroid.  I went ahead  and excised that and sent the whole thing since I was concerned that this  was thought it was very unlikely to be parathyroid and it was parathyroid  gland.  Next, there was another little similar area on the left that looked  more like thyroid and it was in the position for an inferior parathyroid and  this was a little thyroid nodule and not parathyroid gland.  Next I  dissected up into the posterior area identifying a normal-sized superior  left parathyroid.  This was excised.  Little hemoclips were  used for  hemostasis and the gland about  half of it sent, half saved and it was the  superior left parathyroid.  Next, I switched back to the left side and this  area that was on the anterior surface or maybe lateral surface of the  thyroid that I thought was a little thyroid tongue all along and sent that  and it was definitely a parathyroid gland and it was probably the largest,  being probably about 12 or 13 x about 6 x 6 mm but still smaller than most  of the hyperplasia patients that I have worked with. Next of course we now  with these two coming back, did have four glands.  I was impressed that they  weren't any larger.  Dr. Johna Sheriff scrubbed in at this point and kind of  looked in all areas and could not see anything that we thought was a fifth  parathyroid gland and then when good hemostasis had been obtained and we  closed the strap muscles with 4-0 Vicryl, closed the platysma layer with 4-0  Vicryl and then 4-0 Monocryl subcuticular and a few skin staples plus Steri-  Strips in the neck.  Next, I prepped the left arm and in the brachioradialis  just below the flexor crease, made a little vertical incision.  I opened the  fascia and then the underlying muscle.  Put 10 or 11 little pieces each were  less than 1 mm in size that had been taken from the two of the more normal-  appearing parathyroid glands.  Placed them in a stab wound.  The little  piece held in place, then a little 4-0 Prolene suture to help in  identification if it needed to be removed later and then a little fascia  closed with 4-0  Vicryl and staples on the skin.  The patient tolerated the  procedure nicely.  Operating room time was about four hours and he was sent  to the recovery room extubated.  We will check calcium levels plus  electrolytes this evening.  Hopefully his calcium level will not bottom out  too far and whether he will be in two to three days to determine what his calcium levels are in the next  few days.  Anselm Pancoast. Zachery Dakins, M.D.    WJW/MEDQ  D:  02/18/2004  T:  02/19/2004  Job:  [id number]   cc:   Mindi Slicker. Lowell Guitar, M.D.  [address]  [place]  [place] [address]  Fax: [phone]

## 2011-02-18 NOTE — Consult Note (Signed)
NAMETAYO, MAUTE NO.:  [id number]   MEDICAL RECORD NO.:  [id number]          PATIENT TYPE:  INP   LOCATION:  2918                         FACILITY:  [facility]   PHYSICIAN:  Rod Holler, MD     DATE OF BIRTH:  02-27-1951   DATE OF CONSULTATION:  DATE OF DISCHARGE:  06/04/2006                                   CONSULTATION   REFERRING PHYSICIAN:  Hilario Quarry, M.D.   CHIEF COMPLAINT:  Shortness of breath.   HISTORY OF PRESENT ILLNESS:  Mr. Shatzer is a 60 year old male with a history  of end-stage renal disease on hemodialysis, hypertension, and medical  noncompliance.  He missed his hemodialysis today.  The patient presented to  the emergency department with complaints of shortness of breath that is been  going on all day long.  He also complains of a cough with productive sputum  that is clear.  He also complains of abdominal swelling.  He reported chest  pressure to the nephrologist, which he denied to me.  He has no complaints  of diaphoresis or nausea.  He has no complaints of syncope or presyncope, no  palpitations.  In the emergency department, the patient was given 250 mL of  normal saline along with Zofran.   PAST MEDICAL HISTORY:  1. Hypertension.  2. End-stage renal disease on hemodialysis.  3. Hepatitis C.  4. History of polysubstance use.  5. Anemia.  6. Left  7. Left ventricular ejection fraction of 35-40% in October 2005.  8. History of nonobstructive coronary artery disease by cardiac      catheterization in 2003 and 2005 according to the chart.   MEDICATIONS:  1. Norvasc.  2. Tums.  3. Hydrochlorothiazide.   ALLERGIES:  No known drug allergies.   SOCIAL HISTORY:  The patient smokes one pack per day and occasionally drinks  alcohol.   FAMILY HISTORY:  No known history of premature coronary artery disease.   REVIEW OF SYSTEMS:  All systems are reviewed in detail and are negative  except as noted in the history of present  illness.   PHYSICAL EXAMINATION:  VITAL SIGNS:  His blood pressure of 153/91, heart  rate 87, respiratory rate 20, oxygen saturation 96% on room air, temperature  97.4.  GENERAL:  Well-developed, well-nourished male, alert and oriented x3, in no  apparent distress, chronically ill-appearing.  HEENT:  Atraumatic, normocephalic, pupils equal, round and reactive to  light, extraocular movements intact, oropharynx clear.  NECK:  Supple.  No adenopathy.  No JVD.  No carotid bruits.  CHEST:  Lungs clear to auscultation bilaterally with equal bilateral breath  sounds.  CORONARY:  Regular rhythm, normal rate, normal S2, S2, no murmurs, rubs,  gallops, 2+ peripheral pulses, no femoral bruits.  ABDOMEN:  Soft, nontender, nondistended.  Active bowel sounds.  No  hepatosplenomegaly.  EXTREMITIES:  No clubbing, cyanosis or edema.  Left upper extremity AV  fistula.  NEUROLOGIC:  No focal deficits.   LABORATORY DATA:  Troponin less than 0.05, CK-MB of 18, myoglobin of greater  than 500.  Sodium 137, potassium 3.9,  chloride 105, bicarb 25, BUN 10,  creatinine 1, glucose 95.  Hematocrit 35.  D-dimer 0.33.  EKG initially  showed a normal sinus rhythm, LVH with repolarization abnormalities,  nonspecific ST changes, left axis deviation.  Repeat EKG shows LVH,  repolarization abnormalities, left axis deviation.   IMPRESSION AND PLAN:  A 60 year old male with end-stage renal disease on  hemodialysis, who missed his dialysis today and presented with shortness of  breath.  The patient has no chest pain at current and no EKG changes on his  recent EKG.   PLAN:  1. Agree with heparin until rules out for myocardial infarction.  2. Aspirin 325 mg p.o. daily.  3. Lipitor 40 mg p.o. daily.  4. Agree with nitroglycerin drip for improved blood pressure control.  5. Serial cardiac enzymes x3, add a BNP to the lab work.  6. Given prior EF of 35-40%, would repeat a transthoracic echocardiogram.      Rod Holler, MD  Electronically Signed     TRK/MEDQ  D:  06/03/2006  T:  06/05/2006  Job:  [id number]

## 2011-02-18 NOTE — H&P (Signed)
NAME:  BOLTON, CANUPP NO.:  [id number]   MEDICAL RECORD NO.:  [id number]          PATIENT TYPE:  INP   LOCATION:  1823                         FACILITY:  [facility]   PHYSICIAN:  Terrial Rhodes, M.D.DATE OF BIRTH:  Dec 08, 1950   DATE OF ADMISSION:  08/02/2005  DATE OF DISCHARGE:                                HISTORY & PHYSICAL   CHIEF COMPLAINT:  Shortness of breath, headache, chest pain, cough.   HISTORY OF PRESENT ILLNESS:  Mr. Hellstrom is a 60 year old African-American  male with multiple medical problems most notable for end-stage renal  disease, malignant hypertension, noncompliance, substance abuse, and  hepatitis C who presents with a one-day history of increasing shortness of  breath that has been associated with productive cough of a pink-yellow  sputum, headache, chest pain, generalized malaise.  Patient reports that he  did go to dialysis on Saturday, but has been out of his blood pressure  medications over the last week.  He also has a history of a non-ischemic  cardiomyopathy with an EF of 30% and has had some intermittent chest and  abdominal pain over the weekend.  He denies any nausea, vomiting,  hematochezia, melena, or bright red blood per rectum.   He has no known drug allergies.   PAST MEDICAL HISTORY:  1.  End-stage renal disease.  2.  Hypertension.  3.  Hepatitis C.  4.  Anemia.  5.  Secondary hypoparathyroidism.  6.  Cardiomyopathy with an EF of 30%.  7.  Non-occlusive coronary artery disease.  8.  History of cocaine abuse.  9.  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  10. Noncompliance.  11. Status post laparoscopic cholecystectomy.  12. Status post placement of left upper AV fistula July 04, 2005.   MEDICATIONS:  1.  Lisinopril 40 mg b.i.d.  2.  Labetalol 100 mg b.i.d.  3.  Sular 20 mg a day.  4.  Protonix 40 mg at bedtime.  5.  Colace 100 mg b.i.d.  6.  Renagel 800 mg two at each meal.   FAMILY HISTORY:   Noncontributory.  No family history of kidney disease.   SOCIAL HISTORY:  He has a caretaker that lives with him at home.  He also  lives with his brother.  He smokes one pack of cigarettes a day.  He also  has a history of crack cocaine use, but reports he has not had any since he  has moved.   REVIEW OF SYSTEMS:  As per HPI.  GENERAL:  He has had some malaise and  fatigue.  INFECTIOUS:  Has had no fever or chills, but does have a  productive cough.  PULMONARY:  Shortness of breath, dyspnea on exertion,  productive cough with pink-yellow sputum associated with chest tightness.  CARDIAC:  He has had some chest pain with cough.  No orthopnea or PND.  GI:  No nausea, vomiting, hematochezia, melena, or bright red blood per rectum.  GU:  No dysuria, pyuria, hematuria, urgency, frequency, or retention.  RHEUMATOLOGIC:  He denies any arthralgias, but does have generalized  myalgias.  All other systems  negative.   PHYSICAL EXAMINATION:  GENERAL:  Well-developed, well-nourished man in no  apparent distress.  VITAL SIGNS:  Temperature 98.7, pulse 89, blood pressure 194/126,  respiratory rate 20.  HEENT:  Head normocephalic, atraumatic.  Pupils are equal, round, and  reactive to light.  Extraocular muscles intact.  No icterus.  Oropharynx  without lesions.  LUNGS:  Has scattered rhonchi and rales.  CARDIAC:  Regular rate and rhythm.  No rub.  ABDOMEN:  Normoactive bowel sounds, soft, mildly tender to deep palpation.  EXTREMITIES:  No clubbing, cyanosis, edema.  NEUROLOGIC:  Intact.   LABORATORIES:  His B-type natriuretic peptide was greater than 3200.  Potassium of 5.7.  Hemoglobin 11.9.  Myoglobin was greater than 500, MB 6.9,  troponin I was less than 0.05.  Chest x-ray shows a new right lower lobe  infiltrate.   ASSESSMENT/PLAN:  1.  Shortness of breath, hypoxia.  The patient has evidence of pulmonary      edema and volume overload, but also has a new right lower lobe      infiltrate  questionable for pneumonia.  Plan to admit for urgent      hypertension for ultrafiltration and recheck chest x-ray after dialysis.  2.  Productive cough with a pinkish-yellow sputum.  Was started empirically      in azithromycin and follow chest x-ray.  3.  Urgent hypertension.  This is due to patient's noncompliance with      medications.  Will ultrafilter and start patient back on his blood      pressure regimen and follow.  4.  Hyperkalemia, as above.  5.  History of cocaine abuse.  We will check urine tox screen and given the      questionable history of cocaine with chest pain, will also check CPK-MBs      and rule out for myocardial infarction.  6.  Anemia.  Continue with EPO 2500 units.  7.  Secondary hypoparathyroidism.  Continue with Renagel.  8.  History of VRE bacteremia.  He was due to his last dose of vancomycin      today with gentamicin.  We will give it to him here in the dialysis      center.  He was due for surveillance blood cultures on November 11, can      follow up as an outpatient as he will hopefully be discharged prior to      that date.           ______________________________  Terrial Rhodes, M.D.     JC/MEDQ  D:  08/02/2005  T:  08/02/2005  Job:  [id number]

## 2011-02-18 NOTE — Consult Note (Signed)
NAME:  [name], [name] NO.:  [id number]   MEDICAL RECORD NO.:  [id number]          PATIENT TYPE:  INP   LOCATION:  5502                         FACILITY:  [facility]   PHYSICIAN:  Petra Kuba, M.D.    DATE OF BIRTH:  Jan 16, 1951   DATE OF CONSULTATION:  DATE OF DISCHARGE:                                   CONSULTATION   HISTORY:  The patient is seen at the request of Dr. Gerrit Friends for evaluation of  elevated liver tests, abdominal pain, and questionable pancreatitis.  The  patient has been in the hospital about 10 days, has known gallstones and  hepatitis C.  He has had intermittent increasing pain with a rare bout of  vomiting and amylase and lipase are elevated.  His liver tests did seem to  bump up acutely.  All seem to be decreasing significantly although the  amylase and lipase have not quite returned to normal.  Hepatobiliary scan  was negative for any duct obstruction.  MRI of the abdomen did not really  reveal any significant abnormalities.  Based on the report, I do not think  an MRCP was done.  A CT scan earlier in the hospitalization did not show any  pancreas or liver obvious problems, or any dilated duct, although there was  possibly a small amount of fluid in the wall of the gallbladder.  Currently,  the patient is not having any pain, sitting comfortably in the bed, watching  TV.   PAST MEDICAL HISTORY:  Pertinent for end-stage renal disease secondary to  high blood pressure.  He has anemia of chronic disease secondary to  hyperparathyroid, status post parathyroidectomy.  History of hepatitis C.  Ejection fraction of 30 to 35%.   SOCIAL HISTORY:  He does drink alcohol and smokes.  He had done drugs  earlier but supposedly has quit.   FAMILY HISTORY:  Negative for any obvious GI problems or gallbladder  problems, although his dad supposedly had cirrhosis.   REVIEW OF SYSTEMS:  Pertinent for currently feeling better.   ALLERGIES:  None.   CURRENT  MEDICATIONS:  1.  Lisinopril.  2.  Renal vitamins.  3.  Clonidine.  4.  Sular.  5.  Procrit.  6.  Protonix.  7.  Zosyn and metoprolol as an outpatient.   PHYSICAL EXAMINATION:  GENERAL:  In no acute distress.  VITAL SIGNS:  He did have a T max yesterday of 100.2, currently afebrile,  vital signs stable.  ABDOMEN:  Exam is pertinent for his abdomen being soft, nontender, good  bowel sounds.   LABORATORY DATA:  Extensive.  Please see hospital chart.  Last amylase was  383, lipase 66.  Bilirubin normal.  Alkaline phosphatase normal.  Last SGPT  265, SGOT 45 during the hospitalization.  His bilirubin has always been  normal.  His amylase was as high as 477 on the 24th with lipase as high as  158.  AST 1452, ALT 1564 with alkaline phosphatase only minimally elevated  at the highest 138.   ASSESSMENT:  1.  Multiple medical problems.  2.  Gallstones  episodic abdominal pain, nausea and vomiting, increased      amylase and lipase, questionably compatible with gallstone pancreatitis,      although sometimes in renal failure, amylase and lipase can be difficult      to ascertain.  3.  Increased liver tests questionably secondary to #2 and CBD stone.  Also      could be due to his hepatitis C.  Possibly a bout of hypotension or      medicines etc, could have caused his acute increase in liver tests which      now seem to decrease.   PLAN:  The patient seems to be okay right now, and as long as that does not  change, and laboratories do not significantly change, it is difficult to  guess whether there is CBD stones present or not.  Therefore, based on the  risk of ERCP, would prefer the surgeons to proceed with a laparoscopic  cholecystectomy with intraoperative cholangiogram, and then proceed with an  ERCP pending those findings.  I have discussed all of the above with him,  including the risks, benefits, methods of ERCP, but I do believe holding off  for now, is how we should proceed. We  will follow with you and get a  quantitative MRNA for hepatitic C just to see if it is active.   Thank you very much for the consultation.       MEM/MEDQ  D:  07/28/2004  T:  07/28/2004  Job:  [id number]   cc:   Mindi Slicker. Lowell Guitar, M.D.  [address]  [place]  [place] [address]  Fax: [phone]   Velora Heckler, MD  [phone] [address]  [place]  [place] [address]  Fax: [phone]   Olga Millers, M.D. [facility]

## 2011-02-18 NOTE — Consult Note (Signed)
NAME:  [name], Lance NO.:  Waller   MEDICAL RECORD NO.:  Waller                   PATIENT TYPE:  INP   LOCATION:  5511                                 FACILITY:  [facility]   PHYSICIAN:  Cecil Cranker, M.D. [facility]         DATE OF BIRTH:  1950/10/25   DATE OF CONSULTATION:  06/18/2002  DATE OF DISCHARGE:                                   CONSULTATION   HISTORY OF PRESENT ILLNESS:  This is a 60 year old male admitted to [facility] on 06/17/2002 with acute onset of pulmonary edema.  The patient  has a history of end-stage renal disease, hypertension, and history of  noncompliance.  He is also a smoker.   The patient initially denied any recent chest pain; however, later the  patient did remember having approximately 10 to 15 minutes of chest pain on  the day prior to admission.  He states he suddenly became short of breath  last night around 6 p.m. while walking around in his house.  He laid down on  his bed for awhile; however, the dyspnea got worse.  The patient became  concerned and called 911.  He was brought to the [facility]  Emergency Room at around 7 p.m.  On further questioning, the patient stated  he had been somewhat short of breath for approximately two weeks.  He  thought he had pneumonia.  He also reported hemoptysis.  He was dialyzed  after admission, and his symptoms improved.  Today his cardiac enzymes were  found to be elevated, and cardiology was consulted.   PAST MEDICAL HISTORY:  1. End-stage renal disease.  He has dialysis Tuesday, Thursday, and     Saturday.  2. History of hypertension, and his blood pressure has been markedly     elevated during this admission.  3. History of noncompliance.   ALLERGIES:  No known drug allergies   MEDICATIONS:  The patient does not remember what medication is he is on.  Currently he is on Norvasc 10 mg daily, Tums 2 tablets t.i.d., and Nephro-  Vite daily.   SOCIAL  HISTORY:  The patient lives in [place].  He is single.  He is  disabled.  He drinks alcohol occasionally.  He smokes one pack per day and  has done so for at least 30 years.  He denies IV drug abuse.  He does admit  to using cocaine in the past; however, he denies any recent cocaine use.   FAMILY HISTORY:  The patient's father died in his 72s from cirrhosis.  He  believes his father had some sort of heart problem as well.  His mother is  alive.  She has hypertension.  She works at [employer] in  housekeeping.  He has a brother and sister, both of whom have no coronary  artery disease.   REVIEW OF SYSTEMS:  Essentially negative except for  the following. He has  had recent sweats, recent headaches.  He states his vision seems to be  getting poor.  He recently had chest pain, shortness of breath, lower  extremity edema, palpitations, and cough with hemoptysis.  He has a history  of dysuria.  He denies any CVA or seizure history.  As noted, the remainder  of the Review of Systems is negative.   PHYSICAL EXAMINATION:  GENERAL:  A 60 year old black male in no acute  distress.  VITAL SIGNS:  Blood pressure 186/123, pulse in the 80s.  HEENT:  Unremarkable.  NECK:  No bruits, no obvious jugular venous distention.  HEART:  Regular rate and rhythm without murmur.  LUNGS:  Rales at the bases.  SKIN:  Warm and dry.  ABDOMEN:  Soft, nontender, with positive bowel sounds.  EXTREMITIES:  Lower extremities reveal pulses to be intact without edema.   LABORATORY DATA:  Chest x-ray at time of admission showed pulmonary edema by  report, have not seen the official reading.   EKG showed sinus tachycardia, rate 108, with LVH and nonspecific T wave  changes.   Chemistry profile revealed BUN 17, creatinine 5.7, potassium3.6.  CK-MB  enzymes initially revealed CK 433, MB 11.7, index 2.7.  Followup revealed CK  358, MB 10.6, with an index of 3.  Troponin I was 0.35 and then later 0.3.  CBC  revealed hemoglobin 10.9, hematocrit 34, WBC 9.7, platelets 364,000.   IMPRESSION:  1. Pulmonary edema.  2. Mildly elevated cardiac enzymes, question non-Q-wave myocardial     infarction.  3. Hypertension.  4. End-stage renal disease.  5. History of noncompliance.  6. Anemia.  7. Tobacco history.   PLAN:  The patient was seen by Dr. Graceann Congress.  We started aspirin as  well as heparin.  The EKG will be repeated.  We will continue to monitor his  CK-MB enzymes. We will check and echocardiogram today as well as repeat his  chest x-ray.  We will control his hypertension.  Lopressor and an ACE  inhibitor as well as IV nitroglycerin have been added.  Ultimately the  patient will need a cardiac catheterization within the next day or two.  He  may also need a chest CT to further evaluate his hemoptysis and dyspnea.     Delton See, P.A. [facility]                  E. Graceann Congress, M.D. [facility]    DR/MEDQ  D:  06/18/2002  T:  06/18/2002  Job:  [id number]   cc:   Garnetta Buddy, M.D.   Dyke Maes, M.D.  [address] [place]  [place] [address]  Fax: [phone]

## 2011-02-18 NOTE — Cardiovascular Report (Signed)
   NAME:  [name], [name] NO.:  [id number]   MEDICAL RECORD NO.:  [id number]                   PATIENT TYPE:  INP   LOCATION:  2927                                 FACILITY:  [facility]   PHYSICIAN:  Salvadore Farber, M.D. [facility]         DATE OF BIRTH:  01/19/1951   DATE OF PROCEDURE:  06/19/2002  DATE OF DISCHARGE:                              CARDIAC CATHETERIZATION   PROCEDURES:  Left heart catheterization, left ventriculogram, coronary  angiography.   INDICATIONS:  The patient is a 60 year old gentleman with end-stage renal  disease on hemodialysis. He presents with acute onset of shortness of breath  with severe hypertension.  CK-MB rose to 11.7.  He is referred for  diagnostic angiography.   COMPLICATIONS:  None.   DIAGNOSTIC TECHNIQUE:  Informed consent was obtained. Under 2% lidocaine  local anesthesia, a 6 French sheath was placed in the right femoral artery  using the modified Seldinger technique. The JL4 and JR4 catheters were used.  A 6 French pigtail catheter was advanced into the left ventricle.  Ventriculography was performed by power injection. The sheaths will be  removed in the holding room.   FINDINGS:  1. Left main:  Angiographically normal.  2. LAD:  The LAD is a large vessel giving rise to a single large diagonal     branch. It is angiographically normal.  3. Circumflex:  The circumflex is a large vessel giving rise to two obtuse     marginal branches. It is angiographically normal.  4. RCA:  The RCA is a moderate sized vessel which is dominant. It is     angiographically normal.   LEFT VENTRICULOGRAM:  Ejection fraction 45% without regional wall motion  abnormality.  LV pressures:  159/10/30.  No mitral regurgitation. No aortic stenosis on  pullback.   IMPRESSION/RECOMMENDATIONS:  The patient has angiographically normal  coronary arteries. His left ventricular end-diastolic pressure is markedly  elevated in the setting of  hypertension and mildly impaired left ventricular  systolic function. 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 Would continue aspirin and optimize his blood pressure  control.                                                 Salvadore Farber, M.D. [facility]    WED/MEDQ  D:  06/19/2002  T:  06/19/2002  Job:  [id number]

## 2011-02-18 NOTE — Op Note (Signed)
NAME:  [name], [name] NO.:  [id number]   MEDICAL RECORD NO.:  [id number]          PATIENT TYPE:  INP   LOCATION:  5504                         FACILITY:  [facility]   PHYSICIAN:  Janetta Hora. Fields, MD  DATE OF BIRTH:  07/10/1951   DATE OF PROCEDURE:  06/29/2005  DATE OF DISCHARGE:  07/01/2005                                 OPERATIVE REPORT   PROCEDURES:  1.  Neck ultrasound.  2.  Insertion of right internal jugular vein Diatek catheter.   PREOPERATIVE DIAGNOSIS:  End-stage renal disease.   POSTOPERATIVE DIAGNOSIS:  End-stage renal disease.   ANESTHESIA:  Local with IV sedation.   ASSISTANT:  Nurse.   OPERATIVE FINDINGS:  1.  A 23 cm Diatek catheter right internal jugular vein.   OPERATIVE DETAILS:  After obtaining informed consent, the patient was taken  to the operating.  The patient was placed in supine position on the  operating table.  After adequate sedation, the patient's right neck was  inspected with a Site-Rite ultrasound.  Right internal jugular vein was  found be widely patent with normal compressibility and respiratory aeration.  Next, the patient's entire neck and chest were prepped and draped in the  usual sterile fashion.  Local anesthesia was infiltrated over the right  internal jugular vein.  A small finder needle was used to localize the right  internal jugular vein.  A larger introducer needle was then used to  cannulate the right internal jugular vein and a 0.035 J-tip guide wire  threaded through the right internal jugular vein down the right atrium under  fluoroscopic guidance.  Next, sequential 12, 14 and 16-French dilators with  peel-away sheath were placed over the guidewire and the right atrium.  Guidewire and dilator was then removed and a 23 cm Diatek catheter threaded  through the peel-away sheath and the right atrium.  Peel-away sheath was  then removed.  Catheter was tunneled subcutaneously, cut to length and the  hub attached.   Catheter sutured to the skin with nylon sutures.  Neck  insertion site closed with Vicryl stitch.  Catheter was inspected under  fluoroscopy and found without kinks throughout its course and tip to be in  the right atrium.  The patient tolerated the procedure well and there were  no complications.  Sponge, needle and instrument counts were correct at the  end of the case.  The patient was taken to the recovery room in stable  condition.           ______________________________  Janetta Hora Fields, MD     CEF/MEDQ  D:  12/29/2005  T:  12/30/2005  Job:  [id number]

## 2011-02-18 NOTE — Discharge Summary (Signed)
NAME:  Lance, Waller NO.:  [id number]   MEDICAL RECORD NO.:  [id number]                   PATIENT TYPE:  INP   LOCATION:  4712                                 FACILITY:  [facility]   PHYSICIAN:  Anselm Pancoast. Zachery Dakins, M.D.          DATE OF BIRTH:  November 19, 1950   DATE OF ADMISSION:  02/18/2004  DATE OF DISCHARGE:  02/22/2004                                 DISCHARGE SUMMARY   DISCHARGE DIAGNOSES:  1. 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  2. Chronic renal failure.   OPERATION:  Total parathyroidectomy with transplant left forearm, general  anesthesia.   HISTORY:  [name] is a 60 year old male with end-stage renal disease,  who is followed by Dr. Lowell Guitar and associates, and has had a progressive  problem with elevated parathyroid hormone level.  He was referred to our  office, and his parathyroid hormone level was greater than 1000.  We  scheduled him for a total parathyroidectomy with transplant.   HOSPITAL COURSE:  On the patient's admission for this planned surgery, his  calcium was not elevated, 8.3 on the morning of admission, but we proceeded  on with surgery and identified all four glands, transplanted 10 little  fragments to the left forearm, and he was followed by the nephrologist after  surgery.   His first serum calcium did drop.  He was dialyzed the day after surgery,  and then the following day his serum calcium was actually down a little  further.  Then on the third day after his next hemodialysis, his calcium was  at the 5.2 level, and he was treated with 2 amps of sodium bicarbonate.  This is in addition to the full Os-Cal that he is taking three times a day  and at bedtime.   This morning, his calcium level is 8.  He is asymptomatic.  His incisions  are healing nicely, and I think that he can be discharged.  Dr. Briant Cedar  feels that, with the low serum calcium he had on admission, he is probably  not  taking the four Os-Cal, and we will discuss with him again the  importance of the Os-Cal plus his other medications before being discharged.   He will see me in the office for wound check towards the end of the week and  removal of staples in the left forearm.  He has Steri-Strips on his neck  incision, and it is healing without evidence of inflammation.  The gland  size at the time of surgery were not really as large as most of the patients  with secondary hyperparathyroidism, but four glands were identified and  removed, and then pieces of two transplanted to the left forearm.   His next hemodialysis will be Tuesday, and they will check a serum calcium  at that time.  He has Vicodin for pain plus his usual blood pressure and  dialysis medications plus, as instructed, the  importance of the four Os-Cal  with meals and at bedtime.                                                Anselm Pancoast. Zachery Dakins, M.D.    WJW/MEDQ  D:  02/22/2004  T:  02/23/2004  Job:  [id number]   cc:   Mindi Slicker. Lowell Guitar, M.D.  [address]  [place]  [place] [address]  Fax: [phone]

## 2011-02-18 NOTE — Discharge Summary (Signed)
NAME:  [name], [name] NO.:  [id number]   MEDICAL RECORD NO.:  [id number]                    PATIENT TYPE:  INP   LOCATION:                                       FACILITY:  [facility]   PHYSICIAN:  Garnetta Buddy, M.D.                DATE OF BIRTH:  1951-08-13   DATE OF ADMISSION:  06/17/2002  DATE OF DISCHARGE:  06/22/2002                                 DISCHARGE SUMMARY   ADMISSION DIAGNOSES:  1. Pulmonary edema.  2. Hemoptysis.  3. Hypertensive crisis.  4. End-stage renal disease on chronic hemodialysis.  5. Noncompliance.  6. Anemia of chronic disease.  7. Secondary hyperparathyroidism.   DISCHARGE DIAGNOSES:  1. Hypertensive crisis resolved.  2. Pulmonary edema corrected with hemodialysis.  3. Hemoptysis resolved probably secondary to #2.  4. End-stage renal disease on chronic hemodialysis.  5. Nonischemic cardiomyopathy ejection fraction 48%.  6. Nonsustained ventricular tachycardia.  7. Noncompliance.  8. Anemia of chronic disease.  9. Secondary hyperparathyroidism.   HISTORY OF PRESENT ILLNESS:  A 60 year old black male with end-stage renal  disease secondary to hypertension on chronic hemodialysis every Tuesday,  Thursday, Saturday at the [facility] who is severely  noncompliant, left hemodialysis 4 kg above his dry weight on his last  treatment.  He has been noncompliant with his medications.  He presents now  with hemoptysis and dyspnea with a chest x-ray showing pulmonary edema.   LABORATORY DATA ON ADMISSION:  White count 10,300, hemoglobin 10.1,  hematocrit 30.6, platelets 342,000.  Sodium 136, potassium 4.6, chloride  104, glucose 100, BUN 70, creatinine 12.6, albumin 2.7, calcium 8.5.  CK  433, MB 11.7, troponin 0.35.   HOSPITAL COURSE:  Initially the patient underwent emergent hemodialysis in  which 3.2 liters of ultrafiltrate were removed with lowest recorded blood  pressure at 172/113.  Post dialysis  weight was 59.9 kg.  The patient was  heparinized.  Serial cardiac enzymes were elevated consistent with a non-Q-  wave MI.  Cardiac enzymes were peaked on initial blood tests as were  mentioned previously. By the third round of enzymes, CK was down to 355, MB  to 9.0, troponin 0.23.  EKG was consistent with left ventricular  hypertrophy.  This was followed up with an echocardiogram  which showed left  ventricular ejection fraction estimated between 30 and 35% by echo.  The  wall thickness was moderately increased.  There was moderate to severe  mitral valve regurgitation, left atrial dilatation, right ventricular  systolic function mildly reduced, moderate right ventricular hypertrophy,  significant left ventricular dysfunction with apex moving better than the  other walls.  This was then followed up by cardiac catheterization findings  showing normal coronary arteries with the RCA being moderate size and  dominant.  The EF by cardiac catheterization was estimated at 45% with  regional wall motion abnormalities.  No mitral regurgitation was seen and no  aortic stenosis on pullback.  His left ventricular end diastolic pressure  was markedly elevated in the setting of hypertension.  This area was most  consistent with severe hypertensive heart disease and recommendations were  that to continue aspirin and optimize blood pressure control.  An ACE  inhibitor was resumed using Lisinopril at 20 mg at bedtime.  After  catheterization, heparin was stopped.  He dialyzed a total of three dialysis  treatments with the lowest obtained weight 57.5 kg.  Blood pressures were  still not well controlled at the time of discharge and we will continue to  follow this as an outpatient, adjusting his ACE inhibitor upward as  necessary.  Blood pressures at the time of discharge were approximately  160/100.   DISCHARGE MEDICATIONS:  1. Lisinopril 20 mg at bedtime.  2. Norvasc 10 mg at bedtime.  3. Lopressor  50 mg b.i.d.  4. Enteric coated aspirin 325 mg daily.  5. Nephro-Vite vitamin one daily.  6. Calcium 500 mg two with each meal.  7. EPO 5000 units IV in dialysis.  8. Calcijex 3.5 mcg IV each dialysis.   ESTIMATED DRY WEIGHT:  57.0 kg.     Zenovia Jordan, P.A.                      Garnetta Buddy, M.D.    RRK/MEDQ  D:  11/29/2002  T:  11/29/2002  Job:  [id number]   cc:   [facility]

## 2011-02-18 NOTE — H&P (Signed)
NAME:  [name], [name] NO.:  [id number]   MEDICAL RECORD NO.:  [id number]          PATIENT TYPE:  INP   LOCATION:  5506                         FACILITY:  [facility]   PHYSICIAN:  Cecille Aver, M.D.DATE OF BIRTH:  1950-11-25   DATE OF ADMISSION:  07/11/2005  DATE OF DISCHARGE:  07/14/2005                                HISTORY & PHYSICAL   DISCHARGE DIAGNOSES:  1.  Enterococcus bacteremia presumed secondary to hemodialysis catheter.  2.  End-stage renal disease with hyperkalemia, resolved.  3.  Anemia of chronic disease.   PROCEDURES:  1.  July 11, 2005:  Abdominal CT with contrast.  Impression:  Small      kidneys with multiple cysts consistent with chronic renal failure,      status post cholecystectomy, no acute abdominal findings.  2.  July 11, 2005:  Pelvis CT with contrast.  Impression:  No acute pelvic      findings, thick walled bladder.  It may be due to chronic inflammation      or bladder outlet obstruction.  3.  Hemodialysis.   HISTORY OF PRESENT ILLNESS:  [name] is a 60 year old black male with end-  stage renal disease who dialyzes at [place] on Tuesdays, Thursdays, and  Saturdays.  He has a history of noncompliance, hypertension, and history of  cocaine use with hepatitis C.  Patient is a poor historian with most of the  history obtained from e-chart and no info from [facility].  Patient has had access difficulty lately and right arm AV graft  clotting.  This was felt not to be reversible so Perm-Cath placed on  September 27 and now AVA catheter was placed left upper arm July 04, 2005.  Patient did not recall any difficulty with hemodialysis and said he has been  compliant.  Patient does not recall fever or antibiotic therapy.  Last night  patient went to the ER, complained of achy all over, given Tylenol and  discontinued, but returned to ER on October 9.  He told the physician that  he needed dialysis.  At time  of ER visit patient had an increase in blood  pressure with a temperature and now complained of abdominal pain.   ADMISSION LABORATORIES:  Potassium 5.8, BUN 55, creatinine 10.3, WBC 19.2,  platelets 251.  Admission chest x-ray was negative.  Admission abdominal  films negative.  Admission abdominal and pelvic CT results above.   HOSPITAL COURSE:  #1 - ENTEROCOCCUS BACTEREMIA PRESUMED SECONDARY TO  HEMODIALYSIS CATHETER:  Patient was immediately placed on empiric  vancomycin.  His Perm-Catheter did remain in place secondary to a maturing  fistula and no history.  A serum drug screen was checked secondary to  patient's medical history which was positive for cocaine and negative for  opiates.  Patient received antibiotics through this treatment and Perm-Cath  was not removed.   #2 - END-STAGE RENAL DISEASE WITH HYPERKALEMIA, NOW RESOLVED:  Patient's  potassium was 5.5 on admission.  He did receive consecutive hemodialysis  secondary to volume overload and high potassium.  Potassium did normalize  post discharge to 4.4.   #3 - ANEMIA OF CHRONIC DISEASE:  Patient's hemoglobin remained stable at 9.4  at the lowest.  He did not require transfusions.  He remained on Aranesp.   DISCHARGE MEDICATIONS:  Patient was instructed to continue with home  medications as previous.   HEMODIALYSIS DISCHARGE:  Patient is to receive vancomycin 500 mg IV  q.hemodialysis treatment through August 01, 2005 for a total of three weeks  secondary to Enterococcus bacteremia.  Surveillance blood cultures should be  drawn on November 11 secondary to bacteremia.  His hemodialysis orders will  be resumed.  However, any dry weight will be established at 55.5 kg.   DISCHARGE INSTRUCTIONS:  Patient is instructed to not take any showers while  the Perm-Cath is in place.  He is also instructed to take all his  medications to [facility] and let the charge R.N. review  the medications and update our  records.      Azucena Fallen, PA    ______________________________  Cecille Aver, M.D.    MY/MEDQ  D:  08/30/2005  T:  08/30/2005  Job:  [id number]   cc:   [facility]   [facility]

## 2011-02-18 NOTE — Op Note (Signed)
NAME:  [name], [name] NO.:  [id number]   MEDICAL RECORD NO.:  [id number]          PATIENT TYPE:  AMB   LOCATION:  SDS                          FACILITY:  [facility]   PHYSICIAN:  Larina Earthly, M.D.    DATE OF BIRTH:  23-Jun-1951   DATE OF PROCEDURE:  07/04/2005  DATE OF DISCHARGE:                                 OPERATIVE REPORT   PREOPERATIVE DIAGNOSIS:  End-stage renal disease with a poorly functioning  right forearm arteriovenous fistula.   POSTOPERATIVE DIAGNOSIS:  Same.   PROCEDURE:  Placement of left upper arm arteriovenous fistula.   SURGEON:  Larina Earthly, M.D.   ASSISTANT:  Coral Ceo, PA-C   ANESTHESIA:  MAC.   COMPLICATIONS:  None.   DISPOSITION:  To recovery room, stable.   PROCEDURE IN DETAIL:  The patient was taken to the operating room and placed  in supine position where the area of the left arm was prepped and draped in  the usual sterile fashion.  An incision was made over the antecubital space  and carried down to isolate the cephalic vein, which was of good caliber,  and the brachial artery, which was also of good caliber.  The cephalic vein  was mobilized and it was ligated distally and divided.  The vein immobilized  to the level of the brachial artery.  The brachial artery was occluded  proximally and distally and was opened with an 11 blade and extended  longitudinally with Potts scissors.  The vein was spatulated and sewn end to  side to the artery with a running 6-0 Prolene suture.  Clamps were removed  and excellent thrill was noted.  The wound was irrigated with saline and  hemostased with electrocautery.  The wounds were closed with closed with 3-0  Vicryl in the subcutaneous and subcuticular tissues and Benzoin and Steri-  Strips were applied.      Larina Earthly, M.D.  Electronically Signed     TFE/MEDQ  D:  07/04/2005  T:  07/05/2005  Job:  [id number]

## 2011-02-18 NOTE — Op Note (Signed)
   NAME:  [name], [name] NO.:  [id number]   MEDICAL RECORD NO.:  [id number]                   PATIENT TYPE:  OIB   LOCATION:  2899                                 FACILITY:  [facility]   PHYSICIAN:  Di Kindle. Edilia Bo, M.D.        DATE OF BIRTH:  1951/09/12   DATE OF PROCEDURE:  04/22/2003  DATE OF DISCHARGE:                                 OPERATIVE REPORT   PREOPERATIVE DIAGNOSIS:  Aneurysm of right forearm arteriovenous fistula.   POSTOPERATIVE DIAGNOSIS:  Aneurysm of right forearm arteriovenous fistula.   PROCEDURE:  Revision of right forearm arteriovenous fistula; resection of  aneurysm and replacement with interposition 6-mm PTFE graft.   SURGEON:  Di Kindle. Edilia Bo, M.D.   ASSISTANT:  Emerson Monte, RNFA.   ANESTHESIA:  Local.   DESCRIPTION OF PROCEDURE:  The patient was taken to the operating room and  sedated by anesthesia. The right upper extremity was prepped and draped in  the usual sterile fashion.   After the skin was anesthetized with 1% Lidocaine and elliptical incision  was made over this large aneurysm  and the aneurysm was dissected free  completely. The patient was then heparinized with 6000 units of IV heparin.  The vein was clamped proximally  and distally and then the aneurysm was  excised.   A segment of 6-mm PTFE graft was brought to the field and sewn end-to-end at  both ends using continuous 6-0 Prolene suture. Hemostasis was then obtained  within the wound and the wound was closed with a deep layer of 3-0 Vicryl.  The skin was closed with 4-0 Vicryl. A sterile dressing was applied.   The patient tolerated the procedure well. He was transferred to the recovery  room in satisfactory condition. All sponge, instrument and needle counts  were correct.                                               Di Kindle. Edilia Bo, M.D.    CSD/MEDQ  D:  05/02/2003  T:  05/02/2003  Job:  [id number]

## 2011-02-18 NOTE — H&P (Signed)
NAME:  [name], [name] NO.:  [id number]   MEDICAL RECORD NO.:  [id number]          PATIENT TYPE:  INP   LOCATION:  1823                         FACILITY:  [facility]   PHYSICIAN:  Caprice Beaver, MD       DATE OF BIRTH:  1951-08-24   DATE OF ADMISSION:  01/25/2005  DATE OF DISCHARGE:                                HISTORY & PHYSICAL   CHIEF COMPLAINT:  Increasing dyspnea and missed hemodialysis on Saturday,  January 22, 2005.   HISTORY OF PRESENT ILLNESS:  [name] is a 60 year old male with a past  medical history of end-stage renal disease dialyzed on Tuesday, Thursday,  and Saturday via the right arm AV graft at the [facility] in [place]. His  end-stage renal disease is secondary to hypertension, which is under  control. He also has a history of crack cocaine abuse, hepatitis C  nonadherence, nonischemic CHF with an EF of 30% to 40% as of October 2005,  nonobstructive coronary artery disease. He comes to the emergency department  tonight with a chief complaint of increasing dyspnea after missing dialysis  three days ago. He was doing okay until he tried to help his brother with  some housework today, became short of breath, and progressed to dyspnea at  rest that required him to call EMS to be brought to the emergency department  at [facility]. He says that he missed dialysis secondary to  transportation difficulties on Saturday. He also complains of minor cough  that has been increasing over the same period of time as the dyspnea. He  denies chest pain, palpitations, nausea, vomiting, abdominal pain, diarrhea,  or hematemesis. He does report minimal hemoptysis in the setting of using  crack cocaine yesterday. He notes that he regularly uses crack and drinks  three to four beers daily.   ALLERGIES:  No known drug allergies.   MEDICATIONS:  1.  Nephro-Vite one p.o. daily.  2.  Tums ES four with meals.  3.  Renagel 800 mg two with meals.  4.  Lisinopril  40 mg p.o. q.h.s.  5.  Metoprolol 50 mg p.o. b.i.d.  6.  Sular 20 mg p.o. q.h.s.  7.  Protonix 40 mg p.o. q.h.s.  8.  Colace 100 mg p.o. b.i.d.  9.  Epogen 5000 units with hemodialysis.   PAST MEDICAL HISTORY:  1.  End-stage renal disease, dialyzes Tuesday, Thursday, and Saturday at      [facility].  2.  Hypertension.  3.  Cocaine abuse.  4.  Hepatitis C.  5.  Anemia of chronic disease.  6.  Ethanol abuse.  7.  Hyperkalemia.  8.  Nonischemic CHF with an EF of 30% to 40% per 2-D echo in October 2005.  9.  Nonobstructive coronary artery disease.  10. Status post laparoscopic cholecystectomy.   SOCIAL HISTORY:  Positive for crack cocaine weekly several times as well as  3-4 beers daily. He lives alone, is single, and has no children.   FAMILY HISTORY:  Positive for diabetes mellitus and hypertension in several  first-degree relatives.   PHYSICAL  EXAMINATION:  VITAL SIGNS: Temperature 98.9, pulse 100, blood  pressure 222/140, respirations 24, 98% on four liters nasal cannula.  GENERAL: He is sitting up, slightly increased work of breathing, but alert  and oriented times three, and able to converse without difficulty.  HEENT: Oropharynx is clear. No exudates. Minimal JVD. There is a midline  neck scar. There is no thyromegaly. His pupils are icteric, but they are  equal, round, and reactive to light and accommodation. Extraocular movements  are intact.  LUNGS: Crackles bilaterally up to 3/4s of the lungs. There are no wheezes.  HEART: Tachycardic, but distant heart sounds, but no appreciable murmurs,  rubs, or gallops.  ABDOMEN: Positive bowel sounds, nondistended, nontender. Soft. No rebound  tenderness.  EXTREMITIES: Trace edema bilaterally.  No erythema. No skin breakdown.  NEUROLOGIC: Cranial nerves II-XII grossly intact. No grossly neurologic  deficits. Strength is 5/5 bilaterally.   LABORATORY DATA:  Sodium 134, potassium 7.0, chloride 109, bicarbonate 17.5,   BUN 116, creatinine 18.9, glucose 91. I-STAT hemoglobin 11.2, pH 7.37, PCO2  25, PO2 70, and bicarbonate 15.   EKG showed peak T-waves, T-wave inversions in the precordial leads.   Point of care markers reveal CK greater than 500, MB 28, and troponin 0.14.   ASSESSMENT AND PLAN:  This is a 60 year old man with end-stage renal disease  secondary to hypertension, dialyzed on Tuesday, Thursday, and Saturday at  the [facility], history of cocaine and ethanol abuse, who missed  hemodialysis on January 22, 2005, and now presents to fluid overload relating  to dyspnea, and also has hyperkalemia.  1.  End-stage renal disease with missed hemodialysis, now hyperkalemic and      dyspneic. [name] has an EDW of 54 to 55 kg. He will be dialyzed      urgently with a low potassium bath for an hour and a half.  A renal      panel will be assessed after dialysis as well as a chest x-ray to      evaluate for resolution of fluid overload. After this time we will      resume his previous schedule of Tuesday, Thursday, and Saturday as      tolerated.  2.  Congestive heart failure secondary to fluid overload. This is associated      with problem #1 and will be re-evaluated after hemodialysis and fluid      removal.  3.  Hypertension, chronic in nature as well as exacerbated by fluid      overload. He will be dialyzed and re-started on all of his      antihypertensives except for metoprolol at this time secondary to his      cocaine abuse.  4.  Cocaine/ethanol abuse. Hold his beta blocker, thiamine, and folic acid,      and counsel on substance abuse cessation.  5.  Hepatitis C. Will check a CMET to assess his LFTs. This is chronic in      nature obviously.  6.  Hyperkalemia secondary to #1. Recheck renal panel after dialysis and      again tomorrow morning.   DISPOSITION:  Urgent hemodialysis. Follow electrolytes and fluid status. Substance abuse counseling.      BM/MEDQ  D:  01/25/2005   T:  01/25/2005  Job:  [id number]

## 2011-02-18 NOTE — Consult Note (Signed)
NAME:  DJ, SENTENO NO.:  [id number]   MEDICAL RECORD NO.:  [id number]          PATIENT TYPE:  INP   LOCATION:  4733                         FACILITY:  [facility]   PHYSICIAN:  Olga Millers, M.D. LHCDATE OF BIRTH:  04-29-1951   DATE OF CONSULTATION:  07/20/2004  DATE OF DISCHARGE:                                   CONSULTATION   CARDIOLOGY CONSULTATION:   PRIMARY CARE PHYSICIAN:  Dr. __________.   PRIMARY CARDIOLOGIST:  Dr. Corinda Gubler.   CONSULTING PHYSICIAN:  Dr. Jens Som.   SUMMARY OF HISTORY:  Mr. Mckellips is a 60 year old African-American male who  was admitted by the renal service through [facility] on 10/17  secondary to generalized aching and noncompliance.  We were asked to consult  secondary to bradycardia and hypotension during hemodialysis today.  The  patient states that he overslept on Saturday and he missed his hemodialysis.  His last hemodialysis was on Thursday, 10/13, without difficulty.  The  patient stated he was also not taking his blood pressures as prescribed over  the weekend, but he cannot be specific as to which ones he did take or did  not take, or specifically why he did not take them.  On Sunday he developed  a aching all over and it became much worse on Monday, so he called EMS for  transportation to the emergency room.  EMS report shows a blood pressure of  210/150 and a pulse of 100.  The patient denied any associated chest  discomfort, shortness of breath, edema, palpitations or syncope.  He  underwent hemodialysis yesterday pulling over 5000 cc off.  Today he  underwent dialysis again, pulling approximately 250 cc off.  During  hemodialysis today it was noted that his heart rate dropped to 37 with a  corresponding blood pressure 78/38.  The patient was asymptomatic.  His  heart rate and blood pressure increased with atropine.  It is also noted  that he did receive 0.2 of clonidine at 2200 and 0.1 of clonidine at 0215  as  well as lisinopril and Lopressor night last at 2200.   ALLERGIES:  No known drug allergies.   MEDICATIONS PRIOR TO ADMISSION:  1.  Sular 20 mg every day.  2.  Lisinopril 40 mg q.h.s.  3.  Metoprolol 50 mg b.i.d.  4.  Clonidine 0.2 b.i.d.  5.  Tums.  6.  Renagel.  7.  Nephro-Vite.   PAST MEDICAL HISTORY:  1.  History of end-stage renal disease secondary to hypertension with      hemodialysis on Tuesday, Thursday and Saturdays.  2.  Hypertension, noncompliance issues.  3.  Anemia of chronic disease.  4.  Secondary hyperparathyroidism status post parathyroidectomy with      transplantation to the left forearm on 5/05.  5.  Hospitalizations in 9/03, 10/03 and currently, this hospitalization with      pulmonary edema secondary to noncompliance.  6.  In 9/03 he underwent cardiac catheterization which showed an ejection      fraction of 45%, no wall motion abnormalities and no evidence of  coronary artery disease.  An echocardiogram done at that time also      showed an ejection fraction of 30-35%, moderate severe MR, decreased RV      function, moderate RVH and LVH.  7.  History of hepatitis C.   SOCIAL HISTORY:  He resides in [place] alone and is currently disabled.  He is single and continues to smoke one pack per day for 30 years.  He  admits to drinking alcohol and he has not used any illicit drugs in a number  of years.  Prior use included cocaine.   FAMILY HISTORY:  Mother with hypertension.  His father died in his 26s of  cirrhosis and questionable heart problems.  His brother and sister seem to  be alive and well.   REVIEW OF SYSTEMS:  The patient was unwilling to really go over review of  systems.   PHYSICAL EXAMINATION:  GENERAL:  African-American male in no apparent  distress, appears older than his stated age.  VITAL SIGNS:  Temperature is 98.5, pulse at this time is 71, respirations  18, blood pressure 126/90.  Admission weight was 117.3, today 118.4.   Saturations 96% on 2 L.  HEENT:  Unremarkable except for poor dentition.  NECK:  Supple without thyromegaly, adenopathy, JVD or carotid bruits.  CHEST:  Symmetrical excursion.  LUNGS:  Clear to auscultation.  HEART:  Regular rate and rhythm, normal S1, S2.  No S3 or S4 were present.  He does have a 1/6 systolic murmur best appreciated at the left lower  sternal border and apex.  All pulses were symmetrical and intact without  evidence of abdominal or femoral bruits.  SKIN/INTEGUMENT:  Intact.  ABDOMEN:  Bowel sounds present without organomegaly, masses or tenderness.  EXTREMITIES:  Negative cyanosis, clubbing or edema.  MUSCULOSKELETAL/NEUROLOGICAL:  Essentially unremarkable.   ADMISSION DATA:  Admission chest x-ray showed moderate cardiomegaly with  mild perihilar edema.  EKG showed sinus rhythm, left axis deviation, LVH  with repolarization and left atrial enlargement.  Old EKGs were not  available for comparison.  It is noted that there is an EKG on the chart  from the emergency room; however, I do not believe that his EKG belongs to  this patient as there is no LVH repolarization changes.  This EKG does not  have left axis deviation.  The T wave inversion pattern inferiorly as well  as the Q wave in lead III are not appearing on today's EKG.  The room  location from the emergency room EKG is different from the room that the  patient was in.  Hemoglobin and hematocrit 11.2 and 33.0.  Sodium 139,  potassium 6.4, BUN 85, creatinine 13.4.  Alkaline phosphatase 125, AST 144,  ALT 91.  Lipase 50 on admission.   CURRENT MEDICATIONS:  1.  Epogen with hemodialysis.  2.  Tums t.i.d.  3.  Clonidine 0.2 b.i.d.  4.  Lisinopril 40 q.h.s.  5.  Lopressor 50 b.i.d.  6.  Nephro-Vite.  7.  He did receive Kayexalate x1 dose.   IMPRESSION:  1.  Pulmonary edema and hyperkalemia secondary to noncompliance with      hemodialysis and prescriptions, which has improved. 2.  Hypertension secondary to  noncompliance with hemodialysis and      prescriptions.  3.  Hypotension and bradycardia associated with hemodialysis, probably      related to aggressive hemodialysis yesterday and receiving his blood      pressure medications.  4.  EKG, CK total  MB and troponin were performed stat.  Troponin was 0.74.      CK total MB was 1156 and 16.  5.  Normocytic anemia.  6.  Elevated LFTs which are worsening.  Today's labs show alkaline      phosphatase of 138, AST of 1285, ALT is 759.  7.  Hyperkalemia, which has resolved today is 3.6.  8.  History as previously described.   DISPOSITION:  Dr. Jens Som reviewed the patient's history, spoke with and  examined the patient and agrees with the above.  He will hold the Lopressor  for today and wean clonidine and follow on telemetry for further bradycardia  off his medications.  If he needs antihypertensive medications, would avoid  AV nodal blocking agents.  We will continue to cycle enzymes to rule out  myocardial infarction.  If there is no clear trend, we will perform an  adenosine Cardiolite as an inpatient for risk stratification.  We will also  in the meantime check an echocardiogram for further LV and RV dysfunction.  His elevated troponins are likely secondary to his congestive heart failure  and end-stage renal disease.  Also consider addressing his tobacco use and  noncompliance issues.       EW/MEDQ  D:  07/20/2004  T:  07/20/2004  Job:  [id number]   cc:   Cecil Cranker, M.D.

## 2011-02-18 NOTE — Discharge Summary (Signed)
NAME:  [name], [name] NO.:  [id number]   MEDICAL RECORD NO.:  [id number]          PATIENT TYPE:  INP   LOCATION:  5504                         FACILITY:  [facility]   PHYSICIAN:  Aram Beecham B. Eliott Nine, M.D.DATE OF BIRTH:  1951-03-23   DATE OF ADMISSION:  06/28/2005  DATE OF DISCHARGE:  07/01/2005                                 DISCHARGE SUMMARY   ADMITTING DIAGNOSES:  1.  Clotted right arm AV fistula.  2.  Hyperkalemia.  3.  Hypocalcemia.  4.  Hypertension  5.  Anemia of chronic disease.   DISCHARGE DIAGNOSES:  1.  Status post placement right femoral temporary catheter for emergency      hemodialysis removed prior to discharge home.  2.  Hyperkalemia, corrected with emergency dialysis.  3.  Hypocalcemia improved with dialysis and supplementation.  4.  Status post placement of right internal jugular Diatek catheter      June 29, 2005, Dr. Gretta Began.  5.  Scheduled left cephalic vein AV fistula creation July 04, 2005, Dr.      Gretta Began.  6.  End-stage renal disease.  7.  Hypertension.  8.  Anemia of chronic disease.   BRIEF HISTORY:  A 60 year old black male with end-stage renal disease on  dialysis every Tuesday, Thursday, Saturday in the [facility] presents with a clotted right arm AV fistula. His potassium was found  to be 7.1, and he is being admitted to the hospital for emergency dialysis  to correct his hyperkalemia.   LABS ON ADMISSION:  White count 11,400, hemoglobin 12.3, platelets 274,000.  Sodium 137, potassium 7.1, chloride 102, CO2 20, glucose 98, BUN 99,  creatinine 11.8, calcium 5.7, albumin 3.3, phosphorus 3.9.   HOSPITAL COURSE:  On admission, Dr. Darrick Penna placed a right femoral  temporary dialysis catheter. Afterward, the patient underwent emergency  dialysis using zero potassium bath. His potassium was corrected to within  normal range. The following day, the patient had a right internal jugular  Diatek catheter  in place with Dr. Arbie Cookey. The surgeons determined that the  right forearm AV fistula had large thrombosed pseudoaneurysms and was  unsalvageable. Vein mappings were done of his upper extremities. The last  cephalic vein was found to be adequate for a future fistula. This was  scheduled as an outpatient for July 04, 2005, with Dr. Arbie Cookey to create a  new left forearm AV fistula. Meanwhile, while hospitalized, the patient  dialyzed on September 26, 27 and 28, 2006.  His hemoglobins remained stable  between 12-13. He was discharged home in stable condition. His lowest  obtained post dialysis weight was 59.4 kg and [facility] was [facility] to 59 kg. He is to dialyze every Tuesday,  Thursday, Saturday at [facility]. He is to return for  outpatient left cephalic vein fistula July 04, 2005.   DISCHARGE MEDICATIONS:  1.  Aspirin 81 mg daily.  2.  Lisinopril 40 mg at bedtime.  3.  Sular 20 mg at bedtime.  4.  Metoprolol 50 mg b.i.d.  5.  Nephro-Vite vitamin one daily.  6.  Calcium carbonate 500 mg three with meals.  7.  EPO 1000 units IV each dialysis.   NEW DRY WEIGHT:  59 kg.      [name], P.A.    ______________________________  [name], M.D.    RRK/MEDQ  D:  10/03/2005  T:  10/03/2005  Job:  [id number]   cc:   Larina Earthly, M.D.  [address]  [place]  [place] [address]   [facility]

## 2011-02-18 NOTE — Discharge Summary (Signed)
NAME:  [name], [name] NO.:  [id number]   MEDICAL RECORD NO.:  [id number]          PATIENT TYPE:  INP   LOCATION:  5504                         FACILITY:  [facility]   PHYSICIAN:  Wilber Bihari. Caryn Section, M.D.   DATE OF BIRTH:  1951-01-24   DATE OF ADMISSION:  08/02/2005  DATE OF DISCHARGE:  08/04/2005                                 DISCHARGE SUMMARY   DISCHARGE DIAGNOSIS:  1.  Fluid overload secondary to missed hemodialysis.  2.  Malignant hypertension.  3.  End stage renal disease hemodialysis dependent.  4.  Hepatitis C.  5.  Anemia.  6.  Secondary hypoparathyroidism.  7.  Cardiomyopathy with ejection fraction of 30%.  8.  Nonocclusive coronary artery disease.  9.  History of noncompliance.  10. History of cocaine abuse.  11. 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   DISCHARGE MEDICATIONS:  1.  Lisinopril 40 mg 1 tab p.o. daily.  2.  Labetalol 100 mg 1 tab p.o. b.i.d.  3.  Sular 20 mg 1 tab p.o. daily.  4.  Protonix 40 mg 1 tab p.o. q.h.s.  5.  Colace 100 mg 1 tab p.o. b.i.d.  6.  Renagel 800 mg 2 tabs with meals.  7.  Nephrovite 1 tab p.o. daily.  8.  Aranesp 25 mcg weekly injected at dialysis or equivalent dose of Epogen.  9.  Stop Metoprolol.   CONDITION ON DISCHARGE:  The patient is much improved at the time of  discharge.   DISCHARGE INSTRUCTIONS:  The patient will follow up at the [facility] on Saturday, August 06, 2005, where resumed dialysis on a  schedule Tuesday, Thursday and Saturday, his estimated dry weight in the  hospital was 58.5 kilograms.  He is instructed to stop taking his Metoprolol  and prescriptions were given for all the medications listed above.  Compliance was emphasized.  The patient achieved a new dry weight of 58.5.   PROCEDURES:  1.  Chest x-ray done on August 02, 2005, showed stable cardiomegaly and      increased pulmonary vascular congestion with a new right lower lobe      infiltrate  which may be due to pneumonia or asymmetric edema.  2.  Chest x-ray done on August 03, 2005, showed interval resolution of      pulmonary edema, no acute findings.   CONSULTATIONS:  There were no consultations.   BRIEF HISTORY AND PHYSICAL:  Mr. Saket Hellstrom is a 60 year old African  American male with multiple medical problems most notable for end stage  renal disease, malignant hypertension, noncompliance, substance abuse, and  hepatitis C, who presents with a one day history of increasing shortness of  breath associated with a productive cough.  He reports that he did not go to  dialysis on Saturday and has been out of his blood pressure medications over  the last week.  He also complains of chest pain and generalized malaise, as  well.   The patient is well developed in no acute distress.  Vital signs with  temperature 98.7, pulse 89, blood pressure 194/126,  respirations 20.  HEENT:  Normocephalic, atraumatic.  Pupils equal, round, reactive to light.  Lungs  have scattered rhonchi and rales.  Cardiac:  Regular rate and rhythm, no  rub.  Abdomen:  Soft, mildly tender to deep palpation.  Extremities:  No  cyanosis, clubbing, and edema.   ADMISSION LABORATORY DATA:  BNP greater than 3200, potassium 5.7, hemoglobin  11.9, myoglobin greater than 500, MB 6.9, troponin I less than 0.05.   HOSPITAL COURSE:  Problem 1:  Shortness of breath secondary to volume overload.  The patient  came in short of breath with productive sputum and a chest x-ray with  evidence of pulmonary edema and volume overload, but also with a new right  lower lobe infiltrate questionable for pneumonia.  The patient had missed  dialysis.  He was dialyzed and also started on empiric Azithromycin.  A  follow up chest x-ray after dialysis showed complete resolution of pulmonary  edema as well as resolution of questionable right lower lobe infiltrate.  The patient symptomatically improved after dialysis.  He was  continued on  Azithromycin for three days and this was discontinued at the time of his  discharge from the hospital.  Overall impression is that the shortness of  breath was due to volume overload having missed dialysis and compliance was  emphasized.   Problem 2:  Urgent hypertension.  This is due to the patient's noncompliance  with his medications.  He was dialyzed and started back on his blood  pressure regimen as listed in the discharge medications.  He tolerated these  medications fine.  Of note, at some time in the past, the patient was on  Metoprolol, he was told to stop taking this and he will continue on his anti-  hypertensives as listed in the discharge medications.   Problem 3:  History of cocaine abuse.  The patient has a past history of  cocaine abuse, although he denies at the time of this hospitalization.   Problem 4:  Chest pain.  The patient came in with chest pain.  His cardiac  enzymes were elevated with a CK of 506, CK MB 6.8, and troponin 0.14.  His  elevated cardiac enzymes were felt to be likely secondary to cocaine abuse  as well as poorly controlled hypertension.  He has been cathed within the  last year with an EF of 40%.  Cardiac enzymes were felt likely to be due to  heart strain from volume overload.   Problem 5:  Hyperkalemia.  The patient came in with a potassium of 5.7, this  resolved with dialysis and was stable at the time of discharge.   Problem 6:  Anemia.  He was continued on the equivalent of Epogen 2500 units  given at dialysis with an equivalent dose of Aranesp and will resume his  previous dose of Epogen when he continues dialysis as an outpatient.   Problem 7:  Secondary hyperparathyroidism.  The patient was continued on  Renagel during the hospitalization.   Problem 8:  History of VRE bacteremia.  He was due to have his last dose of  vancomycin on the day of admission.  He was administered this dose during dialysis and is due for  surveillance blood cultures on November 11 which  will be done as an outpatient.   PENDING LABS:  There are no pending labs at the time of this discharge  summary.   DISCHARGE LABS AND VITAL SIGNS:  Temperature 97.5, pulse 83, blood pressure  105/75, respirations 20, 95% on room air.  The last labs include white blood  cell count 9, hemoglobin 10.4, hematocrit 31.6, platelets 267.  Sodium 138,  potassium 4, chloride 99, bicarb 26, BUN 41, creatinine 7.1, glucose 95.  Calcium 6.8, phosphorous 4.8.      Inis Sizer, M.D.    ______________________________  Wilber Bihari. Caryn Section, M.D.    DC/MEDQ  D:  08/04/2005  T:  08/04/2005  Job:  [id number]   cc:   [facility]   [facility]

## 2011-02-18 NOTE — Discharge Summary (Signed)
NAME:  [name], [name] NO.:  [id number]   MEDICAL RECORD NO.:  [id number]          PATIENT TYPE:  INP   LOCATION:  5502                         FACILITY:  [facility]   PHYSICIAN:  Mindi Slicker. Lowell Guitar, M.D.  DATE OF BIRTH:  Mar 01, 1951   DATE OF ADMISSION:  07/18/2004  DATE OF DISCHARGE:  08/01/2004                                 DISCHARGE SUMMARY   ADMISSION DIAGNOSES:  1.  Hyperkalemia.  2.  Congestive heart failure secondary to volume overload.  3.  End stage renal disease on chronic hemodialysis.  4.  Hypertension, uncontrolled.  5.  Elevated liver function studies.  6.  Anemia of chronic disease.  7.  Alcohol abuse.   DISCHARGE DIAGNOSES:  1.  Biliary pancreatitis and cholecystitis status post laparoscopic      cholecystectomy July 30, 2004, Dr. Johna Sheriff.  2.  Hyperkalemia and congestive heart failure corrected with dialysis.  3.  End stage renal disease on chronic hemodialysis.  4.  Alcohol abuse ongoing.  5.  Hepatitis C positive with high quantitative RNA.  6.  Anemia of chronic disease.  7.  Hypertension, controlled.  8.  Nonischemic cardiomyopathy with ejection fraction of 30% to 40%.  9.  Non-obstructive coronary artery disease.   HISTORY OF PRESENT ILLNESS:  A 60 year old black male with end stage renal  disease secondary to hypertension, on chronic dialysis Tuesday, Thursday and  Saturday at the [facility] who presents to the emergency  room complaining of aching all over. His last dialysis was July 15, 2004.  He has a history of non-compliance. His potassium was 6.4 on admission. He  was brought in for emergency dialysis.   LABORATORY DATA:  On admission potassium 6.4, BUN 85, creatinine 13.4,  sodium 139, chloride 98, CO2 18, glucose 93, calcium 6.6, albumin 3.9,  lipase 50, SGOT 144, SGPT 91, alkaline phosphatase 125, hemoglobin 10.9,  platelets 333,000. White count 5,600.   Chest x-ray:  Pulmonary vascular congestion and  mild perihilar edema with  moderate cardiomegaly.   HOSPITAL COURSE:  1.  End stage renal disease and hyperkalemia. The patient underwent      emergency dialysis on the evening of admission. His potassium and fluid      overload were corrected with dialysis. His potassium normalized and      through aggressive ultra filtration on dialysis, his weight was brought      down to approximately 54 kg.  2.  Biliary cholecystitis and pancreatitis. The patient complained of      abdominal pain shortly after admission. His liver function studies were      noted to be elevated. This prompted a workup which included CT of the      abdomen, which only showed a small lesion in the upper pole of the right      kidney, which enhanced with contrast. This was felt to be a renal      aneurysm or some hyper-vascular mass. Left kidneys were atrophied with      renal cysts present. Amylase was elevated at 329 and lipase at 47.  Followup 2 days later showed an amylase of 477 and lipase of 158.      Gallstones were seen on ultrasound but a HIDA scan was negative for      obstruction. On July 30, 2004, he underwent laparoscopic      cholecystectomy by Dr. Johna Sheriff. Postoperatively, he was stable and his      liver function studies which had peaked with an SGOT of 1,452 an SGPT of      1,564, and alkaline phosphatase of 138 with normal bilirubin's declined      to SGOT of 45 at time of discharge, along with an SGOT of 117, alkaline      phosphatase of 106, and normal bilirubin's.  3.  Hepatitis C positive. Part of the workup for elevated liver function      studies was a hepatitis panel. His hepatitis B surface antigen was      negative. However, his hepatitis C was positive with a very high      quantitative RNA test of 1,490,000. His liver function studies will be      followed up at the kidney center on an outpatient basis but most likely,      they remain somewhat elevated, even at baseline.  4.   Hypertension. The patient was admitted and placed on his medications      that have been described as an outpatient. Once these were being dosed      on an inpatient basis, he quickly became hypotensive, probably      reflecting regular dosing of these drugs. Ultimately, Clonidine and      Metoprolol were stopped. At the time of discharge, he was sent home only      on Lisinopril 40 mg daily. His dry weight was down to 54 kg and he was      no longer hypertensive. At the time of discharge, his blood pressures      were approximately 110/80.  5.  Nonischemic cardiomyopathy. Cardiology consultation was obtained at the      time of admission due to bradycardia and hypotensive. In retrospect,      this was probably due to his hyperkalemia and dosing of      antihypertensives. Echocardiogram showed left ventricular ejection      fraction estimated to be 35% to 40%. There was mild mitral annular      calcification. Left atrial size was at the upper limits of normal and      there was global hypokinesis with some segmental variation in motion.      Cardiolite done showed stress induced ischemia involving the anterior      wall and inferior lateral walls. This was followed up with a cardiac      catheterization, which showed no significant non-obstructive coronary      artery disease. Ultimately, he was discharged on Lisinopril, Metoprolol,      and Sular. He has a new dry weight of 54 kg.   DISCHARGE MEDICATIONS:  1.  Nephro-Vit vitamin 1 daily.  2.  Tums ES 4 with meals.  3.  Renagel 800 mg 2 with meals.  4.  Lisinopril 40 mg at bedtime.  5.  Metoprolol 50 mg b.i.d.  6.  Sular 20 mg q.h.s.  7.  Protonix 40 mg q.h.s.  8.  Colace 100 mg b.i.d.  9.  EPO 5,000 units intravenously each dialysis.   Dialysis Tuesday, Thursday, and Saturday at [facility].  Dry weight estimated  at 54 kg.      RRK/MEDQ  D:  10/29/2004  T:  10/30/2004  Job:  [id number]  cc:   Lorne Skeens.  Hoxworth, M.D.  [address]  [place]  [place] [address]   [facility]   [facility]   Petra Kuba, M.D.  [address]  [place]  [place] [address]  Fax: [phone]

## 2011-02-18 NOTE — H&P (Signed)
NAME:  [name], [name] NO.:  [id number]   MEDICAL RECORD NO.:  [id number]          PATIENT TYPE:  INP   LOCATION:  2918                         FACILITY:  [facility]   PHYSICIAN:  Maree Krabbe, M.D.DATE OF BIRTH:  May 06, 1951   DATE OF ADMISSION:  06/03/2006  DATE OF DISCHARGE:                                HISTORY & PHYSICAL   REASON FOR ADMISSION:  Shortness of breath and chest heaviness.   HISTORY:  The patient is a 60 year old African-American male complaining  shortness of breath, acute onset this morning.  He is a dialysis patient  with hypertension and hypertensive heart disease.  He came to the emergency  room at 4:50 P.M. complaining of shortness of breath.  I was called by the  emergency room P. A. to see the patient for shortness of breath.  His chest  x-ray was negative for CHF and he positive CK point of care marker with  negative troponin, and possibly abnormal EKG.  When I was talking to the  patient he described a heaviness in his chest area, which had been present  all day since about mid morning and it had a relatively acute onset, and he  had to sit down after the onset.  He did miss dialysis today and he thinks  he has extra fluid.  The pain has been steady with no waxing or waning.  He  has shortness of breath, but no nausea, vomiting or diaphoresis.   PAST MEDICAL HISTORY:  1. Hypertension.  2. Hypertensive heart disease with an ejection fraction of 45% and      diastolic dysfunction on previous echo.  3. History of negative heart cath in 2003 (I am not sure of the reason it      was done) and he has negative coronaries.  4. The patient had another heart catheterization in October 2005 with      normal coronaries.  5. Anemia.  6. Secondary hyperparathyroidism.   SOCIAL HISTORY:  The patient lives alone.  He has a remote history of  cocaine use, but none in quite some time.  He does drink alcohol on the  weekends only according to him,  and he does smoke cigarettes.   MEDICATIONS:  1. Norvasc; uncertain dose.  2. TUMS 1 with meals.   ALLERGIES:  None.   REVIEW OF SYSTEMS:  CONSTITUTIONAL:  In general no fever, chills, sweats or  weight loss.  HEENT:  No hearing loss or vision change, sore throat or  difficulty swallowing.  CARDIORESPIRATORY:  As above.  No productive cough  or hemoptysis.  GASTROINTESTINAL:  No nausea, vomiting, diarrhea or al pain.  GENITOURINARY:  No difficulty voiding.  MUSCULOSKELETAL:  No myalgia,  arthralgia or ankle swelling.  NEUROLOGIC:  No focal numbness or weakness.   PHYSICAL EXAMINATION:  VITAL SIGNS:  Blood pressure 150/90, pulse 80,  respirations 20 and temp 97.4.  GENERAL APPEARANCE:  The patient is alert and in no distress.  SKIN:  The skin is without rash.  HEENT:  Pupils equal, round and react to light and accommodation.  Extraocular muscles  are intact.  Throat is clear.  NECK:  The neck is supple.  JVP up to 10 cm.  CHEST:  The chest is clear throughout.  No rales or wheezing.  HEART:  Cardiac exam shows regular rate without rub, gallop or murmur.  ABDOMEN:  The abdomen is soft and nontender with active bowel sounds.  EXTREMITIES:  The left arm has an AV fistula with a good thrill and bruit.  No peripheral edema.  NEUROLOGIC EXAMINATION:  Neurologically he is alert and oriented times  three.   LABORATORY DATA:  I-STAT 8.  Sodium 122, potassium 4,  chloride 96 and BUN  5.  White blood count 11, hemoglobin 15 and hematocrit 45%.  Point of care  markers; CK/MB first set 13 second set 18, which is abnormally high.  Troponin I point of care less that 0.05 times two.  The first EKG showed 1  mm ST elevation in 3 and aVF compared to old EKG, and 1 mm ST depression in  V4 through V6.  Repeat EKG at 9:05 P.M. showed resolution of acute changes,  normal sinus rhythm and LVH with repolarization changes.  The chest x-ray  shows no CHF or infiltrate.   IMPRESSION:  1. Chest pain, rule  out myocardial infarction, with abnormal      electrocardiographic changes, questionable enzymes changes and negative      heart catheterization in 2003 and 2005.  2. Shortness of breath, uncertain cause.  No evidence of overt congestive      heart failure, though he certainly could have volume overload having      missed dialysis today.  3. Hypertension on Norvasc.  4. Hypertensive heart disease with ejection fraction 45%.  5. Secondary hyperparathyroidism.   PLAN:  1. Admit.  2. Cardiology consult.  3. Rule out MI.  4. IV heparin.  5. We will give nitroglycerin to see if he responds.      Maree Krabbe, M.D.  Electronically Signed     RDS/MEDQ  D:  06/03/2006  T:  06/04/2006  Job:  [id number]

## 2011-02-18 NOTE — Cardiovascular Report (Signed)
NAME:  KRISTOPHER, ATTWOOD NO.:  [id number]   MEDICAL RECORD NO.:  [id number]          PATIENT TYPE:  INP   LOCATION:  5502                         FACILITY:  [facility]   PHYSICIAN:  Charlton Haws, M.D.     DATE OF BIRTH:  [birth year]   DATE OF PROCEDURE:  07/23/2004  DATE OF DISCHARGE:  08/01/2004                              CARDIAC CATHETERIZATION   PROCEDURE:  Standard catheterization was done from the right femoral artery.   The left main coronary artery was normal.   The left anterior descending artery was normal.   Circumflex coronary artery was left dominant and normal.   The right coronary artery had somewhat of a high anterior takeoff.  It was  small and nondominant.  It was normal.   Aortic pressure was 140/90.  LV pressure was 145/13.   IMPRESSION:  The patient has no significant coronary artery disease.  He  will have followup CT scan for a question of abdominal mass.  He will be  continued to be followed by renal for his renal insufficiency.       PN/MEDQ  D:  08/06/2004  T:  08/06/2004  Job:  [id number]   cc:   Terrial Rhodes, M.D.  [address]  [place]  [place] [address]  Fax: [phone]

## 2011-02-18 NOTE — Discharge Summary (Signed)
NAME:  [name], [name] NO.:  [id number]   MEDICAL RECORD NO.:  [id number]          PATIENT TYPE:  INP   LOCATION:  6731                         FACILITY:  [facility]   PHYSICIAN:  Aram Beecham B. Eliott Nine, M.D.DATE OF BIRTH:  Feb 28, 1951   DATE OF ADMISSION:  01/25/2005  DATE OF DISCHARGE:  01/26/2005                                 DISCHARGE SUMMARY   ATTENDING PHYSICIAN:  Aram Beecham B. Eliott Nine, M.D.   DISCHARGE DIAGNOSES:  1.  Fluid overload, hyperkalemia, secondary to missing Saturday hemodialysis      session on January 22, 2005.  2.  End-stage renal disease, hemodialysis on Tuesdays, Thursdays and      Saturdays at the [facility].  3.  Hypertension.  4.  Cocaine abuse, last used on January 24, 2005, (crack cocaine).  5.  Hepatitis-C.  6.  Anemia of chronic disease.  7.  Ethanol abuse.  8.  Hyperkalemia.  9.  Non-ischemic congestive heart failure with an ejection fraction of 30%-      40% in October 2005.  10. Non-obstructive coronary artery disease.  11. Status post laparoscopic cholecystectomy.   DISCHARGE MEDICATIONS:  1.  Nephro-Vite one p.o. daily.  2.  Tums ES four with meals.  3.  Renagel 800 mg, two with meals.  4.  Lisinopril 40 mg p.o. q.h.s.  5.  Labetalol 100 mg p.o. b.i.d.  6.  Sular 20 mg p.o. q.h.s.  7.  Protonix 40 mg p.o. q.h.s.  8.  Colace 100 mg b.i.d. p.r.n.  9.  EPO 5000 units with dialysis.   FOLLOWUP:  [name] will follow up at the [facility] on  Thursday, January 27, 2005.  To resume his Tuesday, Thursday, Saturday  hemodialysis schedule.   HISTORY OF PRESENT ILLNESS:  [name] is a 60 year old male with a past  medical history mentioned above, who presented on January 25, 2005 with  increasing dyspnea after missing dialysis on January 22, 2005.  He said he  missed dialysis because his ride showed up and left.  He also reports  smoking crack cocaine on the day that he missed dialysis, and January 24, 2005.  His dyspnea  began increasing on the day prior to admission as he was  attempting to help his brother with some housework.  He also notes  increasing cough that corresponded with the increasing dyspnea.  He denies  chest pain, palpitations, nausea, vomiting, abdominal pain, diarrhea,  hematemesis.  He did report minimal hemoptysis after using crack cocaine on  the day before admission.   PHYSICAL EXAMINATION:  VITAL SIGNS:  On admission temperature 98.9 degrees,  pulse 100, blood pressure 222/140, respirations 24, O2 saturation 98% on 4  liters nasal cannula.  GENERAL:  He was sitting up with slightly increased work of breathing, but  alert and oriented x3.  HEENT:  Oropharynx clear.  Pupils were icteric but equal, round, reactive to  light and accommodation.  Extraocular movements intact.  NECK:  Minimal jugular venous distention.  A midline neck scar.  No  thyromegaly.  LUNGS:  Had crackles up to his  mid-lungs without wheezes.  HEART:  Tachycardic with distant heart sounds.  No murmurs, rubs, or  gallops.  ABDOMEN:  Positive bowel sounds, nontender, nondistended.  EXTREMITIES:  Trace edema but no tenderness or erythema.  NEUROLOGIC:  Cranial nerves II-XII  grossly intact.  No focal neurologic  deficits.   LABORATORY DATA:  Sodium 134, potassium 7.0, chloride 109, bicarbonate 17.5,  BUN .16, creatinine 18.9, glucose 91.  WBC's 7.7, hemoglobin 11.2, platelets  221.  CK greater than 500, MB 28, troponin 0.14.   Electrocardiogram showed peak T-waves, T-wave inversion in the precordial  leads.   HOSPITAL COURSE:  #1 - FLUID OVERLOAD AND HYPERKALEMIA, SECONDARY TO MISSING  HEMODIALYSIS:  [name] was admitted and dialyzed urgently.  His potassium  returned to 3.5 after hemodialysis.  His dyspnea improved dramatically.  On  hospital day two a chest x-ray showed almost complete resolution of the  pulmonary edema and his lung exam was entirely without crackles.  He  tolerated dialysis without any  difficulty.  He will be discharged, to resume  hemodialysis on his Tuesday, Thursday, Saturday schedule at the [facility].   #2 - HYPERTENSION:  [name] was given Labetalol, as well as clonidine in  the emergency room.  His blood pressure did drop into a more acceptable  range.  His hypertension was probably secondary to chronic hypertension  exacerbated by cocaine abuse as well as fluid overload, secondary to end-  stage renal disease.  He also admits that he has not been taking any of his  hypertensive medications.  At the time of discharge he will be restarted on  Sular, Lisinopril and labetalol will be added for its alpha and beta  antagonist properties.  He is instructed not to take metoprolol even though  he has not been taking this anyway.   #3 - COCAINE AND ETHANOL ABUSE:  [name] was counseled regarding the  risks of continued cocaine abuse.  He said he will look into Narcotics  [facility].   #4 - HEPATITIS-C:  A CMET was checked to evaluate his liver functions, given  his icteric pupils on presentation.  AST was 63, ALT 65, albumin 3.1, total  bilirubin 0.8, alkaline phosphatase 76.   #5 - ELEVATED CARDIAC MARKERS:  In the setting of end-stage renal disease,  fluid overload and hypertension, it was not surprisingly to see his cardiac  enzymes elevated.  A cardiology consultation was obtained.  They agreed that  in this setting without any evidence of chest pain or angina, they would not  recommend any further cardiac workup.  His troponin did trend up to 0.36, CK  went to 500 and 1005.   #6 - SECONDARY HYPERPARATHYROIDISM:  He was continued on Renagel 800 mg, two  tab with meals daily and Tums ES four tab with meals daily.  He will  continue this as an outpatient.   DISCHARGE LABORATORY DATA:  CBC showed a white blood cell count of 8.2, hemoglobin 10.5, platelets 199.  Sodium 140, potassium 3.6, chloride 100,  bicarb 29, glucose 97, BUN 37, creatinine 9.0.   Albumin 2.9, calcium 7.4,  phosphorus 3.8.      BM/MEDQ  D:  01/26/2005  T:  01/26/2005  Job:  [id number]

## 2011-02-18 NOTE — Consult Note (Signed)
NAME:  [name], [name] NO.:  [id number]   MEDICAL RECORD NO.:  [id number]          PATIENT TYPE:  INP   LOCATION:  6731                         FACILITY:  [facility]   PHYSICIAN:  Arvilla Meres, M.D. LHCDATE OF BIRTH:  March 13, 1951   DATE OF CONSULTATION:  01/25/2005  DATE OF DISCHARGE:                                   CONSULTATION   CONSULTING PHYSICIAN:  Arvilla Meres, M.D. [facility].   REQUESTING PHYSICIAN:  Garnetta Buddy, M.D.   PRIMARY CARE PHYSICIAN:  [facility].   CARDIOLOGIST:  Salvadore Farber, M.D. [facility].   PATIENT IDENTIFICATION:  Mr. 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  Cardiology consult called secondary to  increased cardiac markers.   Mr. Teagle reports a two-day history of increasing shortness of breath and  cough after missing hemodialysis on January 22, 2005.  He denies chest pain,  palpitations, nausea, vomiting.  He remains quite functional at home and  continues to work in odd jobs.  However, he does continue to drink alcohol,  smoke cigarettes, and use cocaine on a regular basis.  His last cocaine use  was one to two days ago.  In the emergency room today, his chest x-ray  showed florid pulmonary edema, and he had a potassium of 7.0.  He was taken  to emergent hemodialysis and now feels much better.  During the workup it  was noted that his CK was elevated at 1,005 with an MB of 19 and a troponin  of 0.14 which trended to 0.36.   REVIEW OF SYSTEMS:  He denies any chest pain, palpitations, nausea,  vomiting, abdominal pain, diarrhea, hematemesis, melena, night sweats,  weight loss, also no dysuria.   He does have occasional lower extremity  edema.  The rest of the systems are negative.   PAST MEDICAL HISTORY:  1.  CHF secondary to end stage renal disease and hypertensive      cardiomyopathy.      1.  Cardiac catheterization, September 2003, with no epicardial coronary          disease.  EF of 45%.      2.  Echocardiogram, October 2005, EF of 35-45% with mild TR.  2.  End-stage renal disease on hemodialysis since 1998.  3.  Severe hypertension.  4.  Polysubstance abuse with alcohol, cocaine, and tobacco use, all ongoing.  5.  Hepatitis C positive with a high viral load and history of elevated      liver enzymes.  6.  Biliary pancreatitis with cholecystitis on October 2005, status post      laparoscopic cholecystectomy.  7.  Noncompliance.  8.  Anemia.  9.  Secondary hyperparathyroidism.   MEDICATIONS:  1.  Nephro-Vite one tab a day.  2.  Tums Extra Strength four tablets with meals.  3.  Renagel 800  mg two tablets with meals.  4.  Lisinopril 40 mg q.h.s.  5.  Metoprolol 50 mg b.i.d.  6.  Sular 20 mg q.h.s.  7.  Protonix 40 mg q.h.s.  8.  Colace 100 mg b.i.d.  9.  Epogen 5,000 units with hemodialysis.   He has no known drug allergies.   SOCIAL HISTORY:  He is on disability.  He lives alone in [place].  He has  a history of tobacco use one pack per day ongoing.  He also has history of  ongoing alcohol use, 40 ounce beer every day or every other day.  Ongoing  crack cocaine use.   FAMILY HISTORY:  Significant for diabetes and hypertension in multiple  family members.   PHYSICAL EXAMINATION:  GENERAL:  He is lying flat in bed in no acute  distress.  Respirations are unlabored.  HEENT:  Sclerae are anicteric.  EOMI.  Mucous membranes are moist.  NECK:  Supple.  JVP is elevated to the level of the jaw.  Carotids are 2+  bilaterally without any bruits.  There is no lymphadenopathy, thyromegaly.  CARDIAC:  Regular rate and rhythm with a normal S1 and S2, plus S4.  There   are no murmurs.  LUNGS:  Clear to auscultation.  ABDOMEN:  Soft, nontender, nondistended.  There are no bruits or masses.  There is a question if his liver edge is down several finger breadths.  I  can not ascertain this clearly.  EXTREMITIES:  Warm.  There is no cyanosis, clubbing, or edema.  His DP  pulses are strong bilaterally.  NEUROLOGIC:  He is alert and oriented x 3 and otherwise nonfocal.  His  affect is appropriate though he is a bit sleepy.   LABORATORY:  Show initially was a sodium of 134, with a potassium of 7, a  BUN of 16, and a creatinine of 18.9.  He underwent hemodialysis and now his  sodium is 141 with a potassium of 3.5, chloride 99, bicarb 32, BUN 21, and  creatinine 6.7.  Glucose is 100.  White count 7.7, hematocrit is 27.3%.  Platelets are 221.  CK is 1,005 with an MB of 19.  This is a fraction of  1.9%.  Troponin initially was 0.14.  It went up to 0.36.  EKG has a sinus  tach with a rate of 100.  There is a left anterior fascicular block and LVH  with repolarization changes.  There is a slightly prolonged QT interval at  about 480 msec.   ASSESSMENT/PLAN:  Elevated cardiac markers in the setting of end-stage renal  disease and fluid overload due to noncompliance with dialysis.  There is  currently no evidence of acute coronary syndrome.  These markers are  certainly nonspecific and are likely related to his elevated filling  pressures.  The abnormal CK is far out of proportion to the MB and this  likely represents skeletal muscle breakdown and not myocardial injury, thus  at this  time I would not pursue any further cardiac workup.  I would watch closely  for rhabdomyolysis, though he is already on dialysis.  I recommend checking  an ionized calcium given his prolonged QT.  We will sign off.  Please do not  hesitate to call us with questions.  Thank you for the consult.     DB/MEDQ  D:  01/25/2005  T:  01/26/2005  Job:  [id number]

## 2011-02-18 NOTE — H&P (Signed)
NAME:  [name], [name] NO.:  [id number]   MEDICAL RECORD NO.:  [id number]          PATIENT TYPE:  INP   LOCATION:  5502                         FACILITY:  [facility]   PHYSICIAN:  Mindi Slicker. Lowell Guitar, M.D.  DATE OF BIRTH:  [birth year]   DATE OF ADMISSION:  07/18/2004  DATE OF DISCHARGE:                                HISTORY & PHYSICAL   HISTORY OF PRESENT ILLNESS:  This is a 60 year old black male with end-stage  renal disease secondary to hypertension disease on chronic hemodialysis at  [facility] on a Tuesday, Thursday and Saturday schedule  and presented to the ER early this a.m. complaining of aching all over,  and then EMS was called to his house.  He missed hemodialysis Saturday,  July 17, 2004, and does have a history of noncompliance with his blood  pressure medications and attending hemodialysis on his scheduled time,  sometime.  Now on hemodialysis, he is somewhat lethargic and nonverbal, nods  his head to yes and no about feeling better.  Will not give any other  history.  His noted potassium in the ER was 6.4.  He was brought to the  hemodialysis unit for emergent hemodialysis.   MEDICATIONS:  Listed in [facility] are:  1.  Tums Ultra six h.s.  2.  Renagel 800 mg two a.c.  3.  Nephro-Vite one every day.  4.  Clonidine 0.2 mg b.i.d.  5.  Vasotec 20 mg q.h.s.  6.  Sular 20 mg q.h.s.  7.  Lisinopril 40 mg q.h.s.  8.  Metoprolol 50 mg b.i.d.   Hemodialysis prescription is 3.5 hours, dry weight is 56 kilograms, Epogen  1,000 units every dialysis.  He is not on any vitamin D.   PAST MEDICAL HISTORY:  1.  End-stage renal disease secondary to hypertensive disease.  Hemodialysis      started December 1998.  2.  Long history of hypertension with noncompliance with blood pressure      medications.  3.  Known cardiac history, [facility] evaluation, September 2003,      with cardiac catheterization showing normal  coronary arteries and      ejection fraction of 45% with regional wall abnormalities.  This cardiac      cath was done to evaluate congestive heart failure with a 2D      echocardiogram showing EF of 30-35%.  4.  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  His last PTH was 21.2, September 2005.  5.  Anemia of chronic disease.  6.  History of epistaxis with admission, December 1998, which was also the      admission when he started hemodialysis.   SOCIAL HISTORY:  The patient lives alone.  He is disabled.  He reports  smoking one pack of cigarettes per day.  He reports alcohol use where he  drank several beers a couple of weeks ago.  Denies any illegal drugs.   FAMILY HISTORY:  Noncontributory.   REVIEW OF SYSTEMS:  The patient does not verbalize any  other problems except  hurting all over.  Otherwise he does not give me any more voiced history.   PHYSICAL EXAMINATION:  GENERAL:  In the hemodialysis unit, a thin, black  male, chronically ill, lethargic, rousing to voice and touch stimuli only.  VITAL SIGNS:  Temperature was 97 degrees, blood pressure 155/123, pulse 92,  respirations 18.  HEENT:  Head is normocephalic.  He has a right temporal fleshy skin lesion  consistent with a probable furuncle.  Pupils PERRLA.  Ear, nose, and throat  with no gross abnormalities appreciated.  NECK:  Supple.  Elevated JVD appreciated.  LUNGS:  Reveal scattered rales.  CARDIAC:  Regular rate and rhythm,  S1, S2, S4.  A 2/6 systolic ejection  murmur at the left sternal border.  No rub is heard.  ABDOMEN:  Bowel sounds are positive.  Thin.  Normal limits.  Abdomen is  nontender.  RECTAL:  Deferred.  EXTREMITIES:  No pedal edema.  Right arm AV fistula patent with hemodialysis  needles in.  NEUROLOGIC:  The patient is nonverbal, shaking his head yes to questions  only.  He does move extremities to request.   LABORATORY DATA:  Potassium 6.4, BUN  was 85, creatinine 13.4, sodium 139,  chloride was 98, CO2 was 18, glucose 93.  Calcium is 6.6 with an albumin of  3.9.  Lipase was 50.  SGOT was elevated at 144, SGPT elevated at 91,  alkaline phosphatase 125.  Hemoglobin was 10.9, platelets 333,000, white  count was 5.6.   EKG showed sinus tach, right bundle branch block, left fascicular block, old  inferior infarct.   His chest x-ray showed pulmonary vascular congestion, mild perihilar edema,  and moderate cardiomegaly that is stable.   ADMITTING ASSESSMENT AND PLAN:  1.  Hyperkalemia.  Place the patient on emergent hemodialysis on a 0      potassium bath, followup with labs.  This hyperkalemia is thought to be      due to missing hemodialysis treatments.  2.  Congestive heart failure/volume overload.  This is thought also to be      due to hemodialysis noncompliance with missing his treatment.  He is      placed on hemodialysis now for volume removal and followup cardiac      enzymes.  3.  Hypertension.  This is also again probably secondary to noncompliance      and also volume overload.  Follow up volume status after dialysis,      continue blood pressure medications carefully, probably able to taper      off at the hospital.  4.  Elevated liver function tests.  Question if this is volume, versus      alcohol use, versus liver disease.  Follow up labs, check hepatitis B      and C.  5.  Hypocalcemia probably secondary to binder noncompliance, missing      hemodialysis.  Continue binders at this time.  6.  Decreased mental status, unknown etiology.  Check a CAT scan of his head      after hemodialysis.  7.  Anemia.  Continue EPO and hemodialysis to keep him on schedule.  Follow      up hemoglobin.  8.  End-stage renal disease.  Continue hemodialysis as scheduled and then as      needed.   ADDENDUM:  The patient started waking up on hemodialysis, his normal self, was able to answer questions appropriately; therefore, a CT  scan of his head  is not being ordered as discussed with Dr. Elvis Coil, thought that his  decreased mental status may be due to the narcotics given in the ER.      Markus.Osmond   DWZ/MEDQ  D:  07/27/2004  T:  07/27/2004  Job:  [id number]   cc:   [facility]

## 2011-02-18 NOTE — Consult Note (Signed)
NAME:  [name], [name] NO.:  [id number]   MEDICAL RECORD NO.:  [id number]          PATIENT TYPE:  INP   LOCATION:  5502                         FACILITY:  [facility]   PHYSICIAN:  Velora Heckler, MD      DATE OF BIRTH:  November 30, 1950   DATE OF CONSULTATION:  07/27/2004  DATE OF DISCHARGE:                                   CONSULTATION   REASON FOR CONSULTATION:  Cholelithiasis, pancreatitis.   HISTORY OF PRESENT ILLNESS:  The patient is a 60 year old black male well  known to the renal medicine service.  He was admitted October 17 with pain,  altered mental status, noncompliance with hemodialysis regimen.  He was  admitted on the renal medicine service with an elevated potassium of 6.4.  The patient was also noted to be in congestive heart failure and fluid  overload.  Admission laboratories also showed abnormal liver function tests.  The patient was admitted on the renal service.  He was seen in consultation  by [facility].  The patient underwent dialysis.  CT scan of the  abdomen and pelvis was obtained due to abdominal pain.  This showed  cholelithiasis.  Further laboratory studies demonstrated elevated liver  function tests as well as elevated pancreatic enzymes.  The patient has  slowly improved.  Cardiology assessment is ongoing.  General surgery is  consulted for treatment of cholelithiasis and biliary pancreatitis   PAST MEDICAL HISTORY:  History of end stage renal disease secondary to  hypertension, hemodialysis Tuesday, Thursday and Saturday, history of  hypertension, history of anemia, history of secondary hyperparathyroidism  status post total parathyroidectomy with autotransplantation in May 2005,  history of congestive heart failure, history of hepatitis C.   MEDICATIONS PRIOR TO ADMISSION:  Sular, Lisinopril, Metoprolol, Clonidine,  Tums, Renagel, Nephro-Vite.   ALLERGIES:  No known drug allergies.   SOCIAL HISTORY:  The patient lives in a  rented room in [place].  I have  spoken with his mother by telephone.  The patient has a 30 pack year smoking  history.  He has moderate alcohol use.  He has used cocaine in the past.  He  is single.   FAMILY HISTORY:  Notable for hypertension in the patient's mother who is an  emergency room  nurse.  Father died of cirrhosis and cardiac disease.   REVIEW OF SYMPTOMS:  15 system review documented in the medical record  without significant other positives except as noted above.   PHYSICAL EXAMINATION:  GENERAL:  60 year old black male in no acute distress.  VITAL SIGNS:  Temperature 97.5, pulse 76, respirations 17, blood pressure  136/95, O2 saturation 98% on room air.  HEENT:  Normocephalic.  Sclerae injected.  NECK:  Supple without mass or tenderness.  There is a well healed Kocher  incision.  LUNGS:  Clear to auscultation bilaterally.  HEART:  Regular rate and rhythm.  ABDOMEN:  Soft without distention.  There is no tenderness.  There are bowel  sounds present.  There are no palpable masses.  There is no  hepatosplenomegaly.  There is no sign of hernia.  There are no surgical  wounds.  EXTREMITIES:  Nontender without edema.  There is an AV graft in the right  forearm.  There is a well healed surgical wound on the left forearm.  NEUROLOGICAL:  The patient is alert and oriented without focal deficit.   LABORATORY DATA:  White count 9.8, hemoglobin 10.6, platelet count 239,000.  Amylase decreased to 383, lipase decreased to 66, SGOT decreased to 45, SGPT  decreased to 265, alkaline phosphatase normal at 92, total bilirubin normal  at 0.7.   RADIOGRAPHIC STUDIES:  MRI of the abdomen and CT scan of the abdomen from  earlier this week both demonstrate cholelithiasis.  Nuclear medicine  hepatobiliary scan performed today is a normal study without evidence of  cystic duct obstruction or common bile duct obstruction.   IMPRESSION:  1.  Biliary pancreatitis.  2.  Cholelithiasis.   3.  End stage renal disease.  4.  Hypertension.   PLAN:  The patient will require cholecystectomy during this admission for  treatment of cholelithiasis and biliary pancreatitis.  At this point, his  hepatic enzymes and pancreatic enzymes are normalizing.  We will repeat his  laboratory studies in the morning of October 26.  I would maintain him on  his renal diet with a low fat component.  The patient has likely passed a  common bile duct stone resulting in his liver function tests and pancreatic  enzyme  abnormalities.  Laparoscopic cholecystectomy is discussed with the patient.  The procedure is discussed.  Risks are discussed.  Hospital stay following  the procedure is discussed.  The patient understands and wishes to proceed.  We will monitor his laboratory results and when appropriate, proceed to  cholecystectomy.      Todd   TMG/MEDQ  D:  07/27/2004  T:  07/27/2004  Job:  [id number]   cc:   Fayrene Fearing L. Deterding, M.D.  [address]  [place]  [place] [address]  Fax: [phone]   Mindi Slicker. Lowell Guitar, M.D.  [address]  [place]  [place] [address]  Fax: [phone]   Garnetta Buddy, M.D.  [address]  [place]  [place] [address]  Fax: [phone]

## 2011-02-18 NOTE — Op Note (Signed)
NAME:  [name], [name] NO.:  [id number]   MEDICAL RECORD NO.:  [id number]          PATIENT TYPE:  INP   LOCATION:  5502                         FACILITY:  [facility]   PHYSICIAN:  Sharlet Salina T. Hoxworth, M.D.DATE OF BIRTH:  Feb 18, 1951   DATE OF PROCEDURE:  07/30/2004  DATE OF DISCHARGE:                                 OPERATIVE REPORT   PREOPERATIVE DIAGNOSIS:  Gallstone pancreatitis.   POSTOPERATIVE DIAGNOSIS:  Gallstone pancreatitis.   PROCEDURE:  Laparoscopic cholecystectomy with interoperative cholangiogram.   SURGEON:  Sharlet Salina T. Hoxworth, M.D.   ANESTHESIA:  General.   BRIEF HISTORY:  Mr. Alleva is a 60 year old male who presented with a  typical episode of gallstone pancreatitis and has gallstones confirmed on  ultrasound.  This has clinically and chemically resolved and laparoscopic  cholecystectomy with cholangiogram has been recommended and accepted.  The  nature of the procedure, indications, risks of bleeding, infection, bile  leak, bile duct injury, were discussed and understood.  He is now brought to  the operating room for this procedure.   DESCRIPTION OF PROCEDURE:  The patient was brought to the operating room and  placed in supine position on the operating table and general endotracheal  anesthesia was induced.  He received preoperative antibiotics.  The abdomen  was widely sterilely prepped and draped.  Local anesthesia was used to  infiltrate the trocar sites prior to the incision.  A 1 cm incision was  placed at the umbilicus and dissection was carried down to the midline  fascia which was sharply incised for 1 cm and the peritoneum entered under  direct vision.  Through a mattress suture of 0 Vicryl, the Hasson trocar was  placed and pneumoperitoneum established.  Under direct vision, a 10 mm  trocar was placed in the subxiphoid area and two 5 mm trocars on the right  subcostal margin.  The gallbladder was visualized and was somewhat  distended  and edematous.  There was some chronic omental adhesions which were taken  down carefully with cautery.  The fundus was grasped and elevated up over  the liver and the infundibulum retracted inferolaterally.  Fibrofatty tissue  was stripped off the neck of the gallbladder towards the porta hepatis.  The  distal gallbladder and Calot's triangle was thoroughly dissected.  The  cystic duct was dissected out over about a cm and the cystic duct  gallbladder junction dissected 360 degrees.  The cystic artery was  identified coursing up onto the gallbladder wall.  The cystic duct was then  clipped at the gallbladder junction and operative cholangiogram obtained  through the cystic duct.  This showed good filling of a nondilated common  bile duct and intrahepatic ducts with free flow into the duodenum and no  filling defects.  Following this, the cholangiogram catheter was removed and  the cystic duct was triply clipped proximally and divided.  The cystic  artery was doubly clipped proximally, clipped distally, and divided.  The  gallbladder was then dissected free from its bed using hook cautery and  removed intact through the umbilicus.  Complete hemostasis was assured  in  the gallbladder bed and the right upper quadrant was thoroughly irrigated  and suctioned until clear.  The trocars were removed under direct vision.  All CO2 was  evacuated.  The mattress sutures were secured at the umbilicus.  The skin  incision was closed with interrupted subcuticular 5-0 Monocryl and Steri-  Strips.  Sponge, needle, and instrument counts were correct.  Dry, sterile  dressings were applied.  The patient was taken to the recovery room in good  condition.       BTH/MEDQ  D:  07/30/2004  T:  07/30/2004  Job:  [id number]

## 2011-02-18 NOTE — Discharge Summary (Signed)
NAME:  [name], SKELLY NO.:  [id number]   MEDICAL RECORD NO.:  [id number]                   PATIENT TYPE:  INP   LOCATION:  5504                                 FACILITY:  [facility]   PHYSICIAN:  Zenovia Jordan, P.A.                DATE OF BIRTH:  30-Jul-1951   DATE OF ADMISSION:  07/29/2002  DATE OF DISCHARGE:  08/01/2002                                 DISCHARGE SUMMARY   ADMITTING DIAGNOSES:  1. Perihilar infiltrates.  2. Fever.  3. Congestive heart failure secondary to volume overload.  4. End-stage renal disease on chronic hemodialysis.  5. Uncontrolled hypertension.  6. Hyperkalemia.   DISCHARGE DIAGNOSES:  1. Fever resolved, felt secondary to #2.  2. Perihilar infiltrates, improved on Tequin.  3. Pulmonary edema, improved with fluid removal on dialysis.  4. Uncontrolled hypertension, improved with fluid removal.  5. End-stage renal disease on chronic hemodialysis.  6. Hyperkalemia, corrected with dialysis.   BRIEF HISTORY:  This is a 60 year old black male with end-stage renal  disease secondary to hypertension on chronic hemodialysis every Tuesday,  Thursday, Saturday at the [facility], who has missed the  last couple of dialysis treatments due to fever and chills, cough and  dyspnea.  He had an admission in September of 2003 for pulmonary edema.  At  that time, he underwent cardiac catheterization, which showed unremarkable  coronary artery disease.  DPD placed September 15 was negative.  He  complains of hemoptysis.   LABORATORY DATA ON ADMISSION:  Chest x-ray shows perihilar infiltrates,  right greater than left.  White count is 12,700.  Hemoglobin 10.7, platelets  368,000.  Sodium 137, potassium 6.5, chloride 109, CO2 19, BUN 103,  creatinine 15.5.   HOSPITAL COURSE:  The patient was admitted, placed on IV Tequin, and  underwent hemodialysis.  Blood cultures were obtained, and these remained  negative.   Hemodialyzed on 0-potassium bath to correct his hyperkalemia.  Aggressive ultrafiltration was done.  He dialyzed an extra treatment, and  the lowest obtained weight was 53.6 kg with a blood pressure of 114/65.  Bi-  lobe chest x-ray showed continued improvement in his pulmonary edema.  The  radiographic improvements were significant, consistent with pulmonary edema  versus infiltrate.  Tequin was switched to oral.  His fevers defervesced  over the first 48 hours.  His symptoms resolved, and he was discharged much  improved.  Complete a 10-day course of Tequin.  His dialysis time was also  increased with 3.5 hours, and his dry weight was lowered to 53 kg.   DISCHARGE MEDICATIONS:  1. Calcium 500 mg 3 with each meal.  2. Nephro-Vite vitamin 1 daily.  3. Metoprolol 25 mg b.i.d.  4. Lisinopril 20 mg at bedtime.  5. Norvasc 10 mg at bedtime.  6. Tequin 200 mg at bedtime for 7 more days.  7. ETO 5000 units IV  each dialysis.  8. Calcijex 3.5 mcg IV each dialysis.                                               Zenovia Jordan, P.A.    RRK/MEDQ  D:  09/13/2002  T:  09/14/2002  Job:  [id number]   cc:   [facility]

## 2011-05-24 ENCOUNTER — Other Ambulatory Visit (HOSPITAL_COMMUNITY): Payer: Self-pay | Admitting: Nephrology

## 2011-05-24 DIAGNOSIS — N186 End stage renal disease: Secondary | ICD-10-CM

## 2011-05-27 ENCOUNTER — Ambulatory Visit (HOSPITAL_COMMUNITY)
Admission: RE | Admit: 2011-05-27 | Discharge: 2011-05-27 | Disposition: A | Discharge status: Home / Self Care | Payer: Medicare Other | Source: Ambulatory Visit | Attending: Nephrology | Admitting: Nephrology

## 2011-05-27 DIAGNOSIS — T82898A Other specified complication of vascular prosthetic devices, implants and grafts, initial encounter: Secondary | ICD-10-CM | POA: Insufficient documentation

## 2011-05-27 DIAGNOSIS — M7989 Other specified soft tissue disorders: Secondary | ICD-10-CM | POA: Insufficient documentation

## 2011-05-27 DIAGNOSIS — Y849 Medical procedure, unspecified as the cause of abnormal reaction of the patient, or of later complication, without mention of misadventure at the time of the procedure: Secondary | ICD-10-CM | POA: Insufficient documentation

## 2011-05-27 DIAGNOSIS — N186 End stage renal disease: Secondary | ICD-10-CM

## 2011-05-27 MED ORDER — IOHEXOL 300 MG/ML  SOLN
100.0000 mL | Freq: Once | INTRAMUSCULAR | Status: AC | PRN
  Administered 2011-05-27: 50 mL via INTRAVENOUS

## 2011-06-22 ENCOUNTER — Other Ambulatory Visit (HOSPITAL_COMMUNITY): Payer: Self-pay | Admitting: Nephrology

## 2011-06-22 DIAGNOSIS — N186 End stage renal disease: Secondary | ICD-10-CM

## 2011-06-30 ENCOUNTER — Ambulatory Visit (HOSPITAL_COMMUNITY)
Admission: RE | Admit: 2011-06-30 | Discharge: 2011-06-30 | Disposition: A | Discharge status: Home / Self Care | Payer: Medicare Other | Source: Ambulatory Visit | Attending: Nephrology | Admitting: Nephrology

## 2011-06-30 DIAGNOSIS — I120 Hypertensive chronic kidney disease with stage 5 chronic kidney disease or end stage renal disease: Secondary | ICD-10-CM | POA: Insufficient documentation

## 2011-06-30 DIAGNOSIS — N186 End stage renal disease: Secondary | ICD-10-CM | POA: Insufficient documentation

## 2011-06-30 DIAGNOSIS — Z452 Encounter for adjustment and management of vascular access device: Secondary | ICD-10-CM | POA: Insufficient documentation

## 2011-06-30 DIAGNOSIS — B1920 Unspecified viral hepatitis C without hepatic coma: Secondary | ICD-10-CM | POA: Insufficient documentation

## 2011-07-05 LAB — POCT I-STAT, CHEM 8
BUN: 69 — ABNORMAL HIGH
Calcium, Ion: 0.7 — ABNORMAL LOW
Chloride: 103
Creatinine, Ser: 11.5 — ABNORMAL HIGH
Glucose, Bld: 81

## 2011-07-05 LAB — POCT I-STAT 4, (NA,K, GLUC, HGB,HCT)
HCT: 41
Hemoglobin: 13.9
Sodium: 139

## 2011-12-15 ENCOUNTER — Other Ambulatory Visit: Payer: Self-pay | Admitting: Otolaryngology

## 2011-12-15 DIAGNOSIS — H905 Unspecified sensorineural hearing loss: Secondary | ICD-10-CM

## 2011-12-21 ENCOUNTER — Inpatient Hospital Stay: Admission: RE | Admit: 2011-12-21 | Payer: Medicare Other | Source: Ambulatory Visit

## 2011-12-25 ENCOUNTER — Inpatient Hospital Stay: Admission: RE | Admit: 2011-12-25 | Payer: Medicare Other | Source: Ambulatory Visit

## 2011-12-29 ENCOUNTER — Other Ambulatory Visit: Payer: Medicare Other

## 2012-08-24 ENCOUNTER — Emergency Department (HOSPITAL_COMMUNITY)
Admission: EM | Admit: 2012-08-24 | Discharge: 2012-08-24 | Disposition: A | Discharge status: Home / Self Care | Payer: Medicare Other | Attending: Emergency Medicine | Admitting: Emergency Medicine

## 2012-08-24 ENCOUNTER — Encounter (HOSPITAL_COMMUNITY): Payer: Self-pay

## 2012-08-24 DIAGNOSIS — I10 Essential (primary) hypertension: Secondary | ICD-10-CM | POA: Insufficient documentation

## 2012-08-24 DIAGNOSIS — Z87448 Personal history of other diseases of urinary system: Secondary | ICD-10-CM | POA: Insufficient documentation

## 2012-08-24 DIAGNOSIS — Z79899 Other long term (current) drug therapy: Secondary | ICD-10-CM | POA: Insufficient documentation

## 2012-08-24 DIAGNOSIS — M7989 Other specified soft tissue disorders: Secondary | ICD-10-CM

## 2012-08-24 HISTORY — DX: Essential (primary) hypertension: I10

## 2012-08-24 HISTORY — DX: Disorder of kidney and ureter, unspecified: N28.9

## 2012-08-24 LAB — CBC WITH DIFFERENTIAL/PLATELET
Basophils Absolute: 0.1 10*3/uL (ref 0.0–0.1)
Basophils Relative: 0 % (ref 0–1)
Hemoglobin: 10.8 g/dL — ABNORMAL LOW (ref 13.0–17.0)
MCHC: 34.3 g/dL (ref 30.0–36.0)
Monocytes Relative: 11 % (ref 3–12)
Neutro Abs: 7.6 10*3/uL (ref 1.7–7.7)
Neutrophils Relative %: 67 % (ref 43–77)
WBC: 11.4 10*3/uL — ABNORMAL HIGH (ref 4.0–10.5)

## 2012-08-24 LAB — POCT I-STAT, CHEM 8
Chloride: 104 mEq/L (ref 96–112)
Glucose, Bld: 84 mg/dL (ref 70–99)
HCT: 34 % — ABNORMAL LOW (ref 39.0–52.0)
Hemoglobin: 11.6 g/dL — ABNORMAL LOW (ref 13.0–17.0)
Potassium: 4.5 mEq/L (ref 3.5–5.1)
Sodium: 143 mEq/L (ref 135–145)

## 2012-08-24 NOTE — ED Notes (Signed)
Pt ready for vascular study. Vascular notified.

## 2012-08-24 NOTE — ED Notes (Signed)
Pt sister, Mrs. Hyman Hopes, left phone number, wants to be called to pick up pt later when d/c. [phone]

## 2012-08-24 NOTE — ED Notes (Signed)
Per patient his AVF in his right upper arm has been swollen x5 days. Fistula has positive palpated thrill and auscultated bruit. Pt mentioned it to his Doctor at dialysis and he told him to "keep an eye on it". The swelling has now spread to right periorbital area and has not decreased in his arm. Fistula was used at dialysis Thursday. Pt was taking antibiotic last tuesday for a "cold" previous to arm swelling. Pt thinks he may be having allergic reaction. Denies itching or pain to area.

## 2012-08-24 NOTE — Progress Notes (Signed)
*  PRELIMINARY RESULTS* Vascular Ultrasound Right upper extremity venous duplex has been completed.  Preliminary findings: Right= no evidence of deep or superficial thrombosis.  Farrel Demark, RDMS, RVT  08/24/2012, 4:16 PM

## 2012-08-24 NOTE — ED Notes (Signed)
Pt transported to vascular.  °

## 2012-08-24 NOTE — ED Notes (Addendum)
Pt presents with onset of R arm swelling x 5 days.  Pt has AVF to R upper arm, reports receiving dialysis on Tuesday and Thursday, denies any pain to arm, denies any difficulty accessing fistula.  Pt now reports he was putting on his dog's collar to dog and was cut to R 4th finger by collar x 2 weeks ago.

## 2012-08-24 NOTE — ED Provider Notes (Signed)
History     CSN: [id number]  Arrival date & time 08/24/12  1240   First MD Initiated Contact with Patient 08/24/12 1500      Chief Complaint  Patient presents with  . Arm Pain    (Consider location/radiation/quality/duration/timing/severity/associated sxs/prior treatment) Patient is a 61 y.o. male presenting with arm pain. The history is provided by the patient.  Arm Pain This is a new problem. Episode onset: 5 days ago. The problem occurs constantly. The problem has been gradually improving. Associated symptoms comments: Swelling started in the right arm and initially in the hand and that has resolved but still swelling around the graft. . Nothing aggravates the symptoms. Nothing relieves the symptoms. Treatments tried: did try to elevate and hand swelling improved. The treatment provided no relief.    Past Medical History  Diagnosis Date  . Renal disorder   . Hypertension     Past Surgical History  Procedure Date  . Av fistula placement     No family history on file.  History  Substance Use Topics  . Smoking status: Not on file  . Smokeless tobacco: Not on file  . Alcohol Use:       Review of Systems  Constitutional: Negative for fever.  Gastrointestinal: Negative for nausea and vomiting.  All other systems reviewed and are negative.    Allergies  Review of patient's allergies indicates no known allergies.  Home Medications   Current Outpatient Rx  Name  Route  Sig  Dispense  Refill  . AMLODIPINE BESYLATE 10 MG PO TABS   Oral   Take 10 mg by mouth at bedtime.         . AMOXICILLIN-POT CLAVULANATE 500-125 MG PO TABS   Oral   Take 1 tablet by mouth 2 (two) times daily. X 7 days. Started on 08/16/12.         Marland Kitchen LOSARTAN POTASSIUM 25 MG PO TABS   Oral   Take 25 mg by mouth at bedtime.           BP 157/89  Pulse 83  Temp 98.4 F (36.9 C)  Resp 16  SpO2 95%  Physical Exam  Nursing note and vitals reviewed. Constitutional: He is  oriented to person, place, and time. He appears well-developed and well-nourished. No distress.  HENT:  Head: Normocephalic and atraumatic.  Mouth/Throat: Oropharynx is clear and moist.  Eyes: Conjunctivae normal and EOM are normal. Pupils are equal, round, and reactive to light.  Neck: Normal range of motion. Neck supple.  Cardiovascular: Normal rate, regular rhythm and intact distal pulses.   No murmur heard. Pulmonary/Chest: Effort normal and breath sounds normal. No respiratory distress. He has no wheezes. He has no rales.  Musculoskeletal: Normal range of motion. He exhibits no edema and no tenderness.       Arms:      Old non-functioning graft on the left arm.  Neurological: He is alert and oriented to person, place, and time.  Skin: Skin is warm and dry. No rash noted. No erythema.  Psychiatric: He has a normal mood and affect. His behavior is normal.    ED Course  Procedures (including critical care time)  Labs Reviewed  CBC WITH DIFFERENTIAL - Abnormal; Notable for the following:    WBC 11.4 (*)     RBC 4.04 (*)     Hemoglobin 10.8 (*)     HCT 31.5 (*)     RDW 17.0 (*)     Monocytes Absolute 1.2 (*)  All other components within normal limits   No results found.   No diagnosis found.    MDM   Patient is coming in complaining of a 5 days of right arm swelling. He has a history of an AV fistula in the right upper arm that he uses for dialysis Tuesday Thursdays and Saturdays. His last dialysis was yesterday full course. She denies any fever, pain or redness of the arm but there is appreciable swelling without warmth or erythema. He has a 2+ radial pulse and good thrill over his fistula. He states there's been no difficulties with dialysis. Patient states he started on Augmentin 08/16/2012 for 7 days for a upper respiratory tract infection and the swelling started shortly after starting antibiotics which he finished yesterday. He initially states he had swelling in his  hand which is improved but the swelling around his graft is still present. There is no sign of hematoma. CBC with a normal white count of 11,000 and a stable hemoglobin. I-STAT pending and duplex of the right upper extremity pending.    5:05 PM I-STAT within normal potassium. Duplex of the right upper extremity is negative for DVT or venous abnormality. Discussed with patient continuing elevating his arm and following up with his dialysis physician if symptoms do not resolve      Gwyneth Sprout, MD 08/24/12 1705

## 2012-08-29 ENCOUNTER — Other Ambulatory Visit: Payer: Self-pay | Admitting: *Deleted

## 2012-08-29 DIAGNOSIS — Z4931 Encounter for adequacy testing for hemodialysis: Secondary | ICD-10-CM

## 2012-08-29 DIAGNOSIS — N186 End stage renal disease: Secondary | ICD-10-CM

## 2012-09-11 ENCOUNTER — Other Ambulatory Visit (HOSPITAL_COMMUNITY): Payer: Self-pay | Admitting: Nephrology

## 2012-09-11 ENCOUNTER — Encounter: Payer: Self-pay | Admitting: Vascular Surgery

## 2012-09-11 DIAGNOSIS — N186 End stage renal disease: Secondary | ICD-10-CM

## 2012-09-12 ENCOUNTER — Ambulatory Visit: Payer: Medicare Other | Admitting: Vascular Surgery

## 2012-09-12 ENCOUNTER — Ambulatory Visit (HOSPITAL_COMMUNITY): Admission: RE | Admit: 2012-09-12 | Payer: Medicare Other | Source: Ambulatory Visit

## 2013-07-15 ENCOUNTER — Other Ambulatory Visit: Payer: Self-pay | Admitting: Nephrology

## 2013-07-15 ENCOUNTER — Ambulatory Visit
Admission: RE | Admit: 2013-07-15 | Discharge: 2013-07-15 | Disposition: A | Discharge status: Home / Self Care | Payer: Medicare Other | Source: Ambulatory Visit | Attending: Nephrology | Admitting: Nephrology

## 2013-07-15 DIAGNOSIS — R05 Cough: Secondary | ICD-10-CM

## 2013-07-15 DIAGNOSIS — M25552 Pain in left hip: Secondary | ICD-10-CM

## 2013-07-17 ENCOUNTER — Other Ambulatory Visit: Payer: Self-pay | Admitting: Nephrology

## 2013-07-17 DIAGNOSIS — R52 Pain, unspecified: Secondary | ICD-10-CM

## 2013-07-22 ENCOUNTER — Other Ambulatory Visit: Payer: Medicare Other

## 2013-07-31 ENCOUNTER — Inpatient Hospital Stay (HOSPITAL_COMMUNITY): Payer: Medicare Other

## 2013-07-31 ENCOUNTER — Other Ambulatory Visit: Payer: Self-pay | Admitting: Nephrology

## 2013-07-31 ENCOUNTER — Emergency Department (HOSPITAL_COMMUNITY): Payer: Medicare Other

## 2013-07-31 ENCOUNTER — Encounter (HOSPITAL_COMMUNITY): Payer: Self-pay | Admitting: Emergency Medicine

## 2013-07-31 ENCOUNTER — Ambulatory Visit
Admission: RE | Admit: 2013-07-31 | Discharge: 2013-07-31 | Disposition: A | Discharge status: Home / Self Care | Payer: Medicare Other | Source: Ambulatory Visit | Attending: Nephrology | Admitting: Nephrology

## 2013-07-31 ENCOUNTER — Inpatient Hospital Stay (HOSPITAL_COMMUNITY)
Admission: EM | Admit: 2013-07-31 | Discharge: 2013-08-08 | DRG: 477 | Disposition: A | Discharge status: Skilled Nursing Facility | Payer: Medicare Other | Attending: Internal Medicine | Admitting: Internal Medicine

## 2013-07-31 DIAGNOSIS — Z992 Dependence on renal dialysis: Secondary | ICD-10-CM

## 2013-07-31 DIAGNOSIS — I120 Hypertensive chronic kidney disease with stage 5 chronic kidney disease or end stage renal disease: Secondary | ICD-10-CM | POA: Diagnosis present

## 2013-07-31 DIAGNOSIS — C649 Malignant neoplasm of unspecified kidney, except renal pelvis: Secondary | ICD-10-CM | POA: Diagnosis present

## 2013-07-31 DIAGNOSIS — Z720 Tobacco use: Secondary | ICD-10-CM | POA: Diagnosis present

## 2013-07-31 DIAGNOSIS — B1920 Unspecified viral hepatitis C without hepatic coma: Secondary | ICD-10-CM | POA: Diagnosis present

## 2013-07-31 DIAGNOSIS — I871 Compression of vein: Secondary | ICD-10-CM | POA: Diagnosis present

## 2013-07-31 DIAGNOSIS — H905 Unspecified sensorineural hearing loss: Secondary | ICD-10-CM | POA: Diagnosis present

## 2013-07-31 DIAGNOSIS — C799 Secondary malignant neoplasm of unspecified site: Secondary | ICD-10-CM | POA: Diagnosis present

## 2013-07-31 DIAGNOSIS — M25552 Pain in left hip: Secondary | ICD-10-CM | POA: Diagnosis present

## 2013-07-31 DIAGNOSIS — D649 Anemia, unspecified: Secondary | ICD-10-CM

## 2013-07-31 DIAGNOSIS — I509 Heart failure, unspecified: Secondary | ICD-10-CM | POA: Diagnosis present

## 2013-07-31 DIAGNOSIS — M8440XA Pathological fracture, unspecified site, initial encounter for fracture: Secondary | ICD-10-CM | POA: Diagnosis present

## 2013-07-31 DIAGNOSIS — M8440XS Pathological fracture, unspecified site, sequela: Secondary | ICD-10-CM

## 2013-07-31 DIAGNOSIS — D631 Anemia in chronic kidney disease: Secondary | ICD-10-CM | POA: Diagnosis present

## 2013-07-31 DIAGNOSIS — R52 Pain, unspecified: Secondary | ICD-10-CM

## 2013-07-31 DIAGNOSIS — F17200 Nicotine dependence, unspecified, uncomplicated: Secondary | ICD-10-CM | POA: Diagnosis present

## 2013-07-31 DIAGNOSIS — Z79899 Other long term (current) drug therapy: Secondary | ICD-10-CM

## 2013-07-31 DIAGNOSIS — M25559 Pain in unspecified hip: Secondary | ICD-10-CM

## 2013-07-31 DIAGNOSIS — N186 End stage renal disease: Secondary | ICD-10-CM | POA: Diagnosis present

## 2013-07-31 DIAGNOSIS — M948X9 Other specified disorders of cartilage, unspecified sites: Secondary | ICD-10-CM

## 2013-07-31 DIAGNOSIS — K5900 Constipation, unspecified: Secondary | ICD-10-CM | POA: Diagnosis present

## 2013-07-31 DIAGNOSIS — M898X5 Other specified disorders of bone, thigh: Secondary | ICD-10-CM

## 2013-07-31 DIAGNOSIS — N2581 Secondary hyperparathyroidism of renal origin: Secondary | ICD-10-CM | POA: Diagnosis present

## 2013-07-31 DIAGNOSIS — C7951 Secondary malignant neoplasm of bone: Principal | ICD-10-CM | POA: Diagnosis present

## 2013-07-31 DIAGNOSIS — Z23 Encounter for immunization: Secondary | ICD-10-CM

## 2013-07-31 DIAGNOSIS — I10 Essential (primary) hypertension: Secondary | ICD-10-CM | POA: Diagnosis present

## 2013-07-31 HISTORY — DX: Secondary hyperparathyroidism of renal origin: N25.81

## 2013-07-31 HISTORY — DX: Dependence on renal dialysis: Z99.2

## 2013-07-31 HISTORY — DX: Anemia in other chronic diseases classified elsewhere: D63.8

## 2013-07-31 LAB — COMPREHENSIVE METABOLIC PANEL
ALT: 13 U/L (ref 0–53)
Alkaline Phosphatase: 88 U/L (ref 39–117)
BUN: 42 mg/dL — ABNORMAL HIGH (ref 6–23)
Chloride: 97 mEq/L (ref 96–112)
GFR calc Af Amer: 10 mL/min — ABNORMAL LOW (ref >90)
Glucose, Bld: 85 mg/dL (ref 70–99)
Potassium: 4.8 mEq/L (ref 3.5–5.1)
Sodium: 140 mEq/L (ref 135–145)
Total Bilirubin: 0.5 mg/dL (ref 0.3–1.2)
Total Protein: 8.1 g/dL (ref 6.0–8.3)

## 2013-07-31 LAB — CBC WITH DIFFERENTIAL/PLATELET
Eosinophils Absolute: 0.1 10*3/uL (ref 0.0–0.7)
Hemoglobin: 10.1 g/dL — ABNORMAL LOW (ref 13.0–17.0)
Lymphocytes Relative: 22 % (ref 12–46)
Lymphs Abs: 2.1 10*3/uL (ref 0.7–4.0)
Monocytes Relative: 14 % — ABNORMAL HIGH (ref 3–12)
Neutro Abs: 6.1 10*3/uL (ref 1.7–7.7)
Neutrophils Relative %: 63 % (ref 43–77)
Platelets: 306 10*3/uL (ref 150–400)
RBC: 3.86 MIL/uL — ABNORMAL LOW (ref 4.22–5.81)
WBC: 9.6 10*3/uL (ref 4.0–10.5)

## 2013-07-31 MED ORDER — MORPHINE SULFATE 4 MG/ML IJ SOLN
4.0000 mg | Freq: Once | INTRAMUSCULAR | Status: AC
  Administered 2013-07-31: 4 mg via INTRAVENOUS
  Filled 2013-07-31: qty 1

## 2013-07-31 MED ORDER — NEPRO/CARBSTEADY PO LIQD
237.0000 mL | ORAL | Status: DC | PRN
  Filled 2013-07-31: qty 237

## 2013-07-31 MED ORDER — AMLODIPINE BESYLATE 10 MG PO TABS
10.0000 mg | ORAL_TABLET | Freq: Every day | ORAL | Status: DC
  Administered 2013-07-31 – 2013-08-07 (×8): 10 mg via ORAL
  Filled 2013-07-31 (×10): qty 1

## 2013-07-31 MED ORDER — ONDANSETRON HCL 4 MG/2ML IJ SOLN: 4.0000 mg | Freq: Four times a day (QID) | INTRAMUSCULAR | Status: DC | PRN

## 2013-07-31 MED ORDER — PENTAFLUOROPROP-TETRAFLUOROETH EX AERO
1.0000 "application " | INHALATION_SPRAY | CUTANEOUS | Status: DC | PRN
  Filled 2013-07-31: qty 103.5

## 2013-07-31 MED ORDER — DOXEPIN HCL 25 MG PO CAPS
25.0000 mg | ORAL_CAPSULE | Freq: Every day | ORAL | Status: DC
  Administered 2013-07-31 – 2013-08-07 (×8): 25 mg via ORAL
  Filled 2013-07-31 (×10): qty 1

## 2013-07-31 MED ORDER — LIDOCAINE-PRILOCAINE 2.5-2.5 % EX CREA
1.0000 "application " | TOPICAL_CREAM | CUTANEOUS | Status: DC | PRN
  Filled 2013-07-31: qty 5

## 2013-07-31 MED ORDER — LOSARTAN POTASSIUM 25 MG PO TABS
25.0000 mg | ORAL_TABLET | Freq: Every day | ORAL | Status: DC
  Administered 2013-07-31 – 2013-08-03 (×4): 25 mg via ORAL
  Filled 2013-07-31 (×5): qty 1

## 2013-07-31 MED ORDER — ALBUTEROL SULFATE (5 MG/ML) 0.5% IN NEBU: 2.5000 mg | INHALATION_SOLUTION | RESPIRATORY_TRACT | Status: DC | PRN

## 2013-07-31 MED ORDER — HEPARIN SODIUM (PORCINE) 5000 UNIT/ML IJ SOLN
5000.0000 [IU] | Freq: Three times a day (TID) | INTRAMUSCULAR | Status: DC
  Administered 2013-07-31 – 2013-08-01 (×3): 5000 [IU] via SUBCUTANEOUS
  Filled 2013-07-31 (×5): qty 1

## 2013-07-31 MED ORDER — ALTEPLASE 2 MG IJ SOLR
2.0000 mg | Freq: Once | INTRAMUSCULAR | Status: AC | PRN
  Filled 2013-07-31: qty 2

## 2013-07-31 MED ORDER — DARBEPOETIN ALFA-POLYSORBATE 25 MCG/0.42ML IJ SOLN
25.0000 ug | INTRAMUSCULAR | Status: DC
  Filled 2013-07-31: qty 0.42

## 2013-07-31 MED ORDER — ACETAMINOPHEN 325 MG PO TABS: 650.0000 mg | ORAL_TABLET | Freq: Four times a day (QID) | ORAL | Status: DC | PRN

## 2013-07-31 MED ORDER — ACETAMINOPHEN 650 MG RE SUPP: 650.0000 mg | Freq: Four times a day (QID) | RECTAL | Status: DC | PRN

## 2013-07-31 MED ORDER — IOHEXOL 300 MG/ML  SOLN
80.0000 mL | Freq: Once | INTRAMUSCULAR | Status: AC | PRN
  Administered 2013-07-31: 80 mL via INTRAVENOUS

## 2013-07-31 MED ORDER — CALCIUM CARBONATE ANTACID 500 MG PO CHEW
3.0000 | CHEWABLE_TABLET | Freq: Every day | ORAL | Status: DC
  Administered 2013-07-31 – 2013-08-08 (×9): 600 mg via ORAL
  Filled 2013-07-31 (×9): qty 3

## 2013-07-31 MED ORDER — HYDROMORPHONE HCL PF 1 MG/ML IJ SOLN
0.5000 mg | INTRAMUSCULAR | Status: DC | PRN
  Administered 2013-07-31 – 2013-08-02 (×2): 0.5 mg via INTRAVENOUS
  Filled 2013-07-31 (×2): qty 1

## 2013-07-31 MED ORDER — HEPARIN SODIUM (PORCINE) 1000 UNIT/ML DIALYSIS
1000.0000 [IU] | INTRAMUSCULAR | Status: DC | PRN
  Filled 2013-07-31: qty 1

## 2013-07-31 MED ORDER — HYDROCODONE-ACETAMINOPHEN 5-325 MG PO TABS
1.0000 | ORAL_TABLET | ORAL | Status: DC | PRN
  Administered 2013-08-01 – 2013-08-05 (×8): 2 via ORAL
  Administered 2013-08-05: 1 via ORAL
  Administered 2013-08-06 – 2013-08-08 (×3): 2 via ORAL
  Filled 2013-07-31 (×11): qty 2

## 2013-07-31 MED ORDER — SODIUM CHLORIDE 0.9 % IV SOLN: 100.0000 mL | INTRAVENOUS | Status: DC | PRN

## 2013-07-31 MED ORDER — LIDOCAINE HCL (PF) 1 % IJ SOLN: 5.0000 mL | INTRAMUSCULAR | Status: DC | PRN

## 2013-07-31 MED ORDER — ONDANSETRON HCL 4 MG PO TABS
4.0000 mg | ORAL_TABLET | Freq: Four times a day (QID) | ORAL | Status: DC | PRN
  Administered 2013-08-05: 4 mg via ORAL
  Filled 2013-07-31: qty 1

## 2013-07-31 MED ORDER — SODIUM CHLORIDE 0.9 % IV SOLN
62.5000 mg | INTRAVENOUS | Status: DC
  Administered 2013-08-01 – 2013-08-08 (×2): 62.5 mg via INTRAVENOUS
  Filled 2013-07-31 (×4): qty 5

## 2013-07-31 NOTE — ED Provider Notes (Signed)
CSN: [id number]     Arrival date & time 07/31/13  1327 History   First MD Initiated Contact with Patient 07/31/13 1635     Chief Complaint  Patient presents with  . Hip Pain   (Consider location/radiation/quality/duration/timing/severity/associated sxs/prior Treatment) HPI Comments: 62 year old male presents with several months of left hip pain. He states he may have fallen when the pain started but is not for sure. He's been followed by his kidney doctor, Dr. Briant Cedar, who has been ordering outpatient x-rays and MRI. When the MRI results came back showing lytic lesions surrounding a possible fracture he told to come to the ER. Patient denies any new trauma to his hip. Denies any weakness or numbness. The pain is very severe. He has had a chronic cough from smoking has not changed recently. Denies any shortness of breath. He denies any weight loss or blood in the stools.   Past Medical History  Diagnosis Date  . Renal disorder   . Hypertension   . Dialysis patient    Past Surgical History  Procedure Laterality Date  . Av fistula placement     No family history on file. History  Substance Use Topics  . Smoking status: Current Every Day Smoker  . Smokeless tobacco: Not on file  . Alcohol Use: No    Review of Systems  Constitutional: Negative for fever, chills and unexpected weight change.  Respiratory: Positive for cough. Negative for shortness of breath.   Cardiovascular: Negative for chest pain.  Gastrointestinal: Negative for vomiting, abdominal pain, diarrhea and blood in stool.  Genitourinary: Negative for hematuria.  Musculoskeletal:       Left hip pain  All other systems reviewed and are negative.    Allergies  Review of patient's allergies indicates no known allergies.  Home Medications   Current Outpatient Rx  Name  Route  Sig  Dispense  Refill  . amLODipine (NORVASC) 10 MG tablet   Oral   Take 10 mg by mouth at bedtime.         . calcium carbonate (TUMS  - DOSED IN MG ELEMENTAL CALCIUM) 500 MG chewable tablet   Oral   Chew 3 tablets by mouth daily.         Marland Kitchen doxepin (SINEQUAN) 25 MG capsule   Oral   Take 25 mg by mouth at bedtime.         Marland Kitchen losartan (COZAAR) 25 MG tablet   Oral   Take 25 mg by mouth at bedtime.         . traMADol (ULTRAM) 50 MG tablet   Oral   Take 50 mg by mouth every 6 (six) hours as needed for pain (pain).          BP 134/77  Pulse 83  Temp(Src) 98.4 F (36.9 C) (Oral)  Resp 16  SpO2 96% Physical Exam  Nursing note and vitals reviewed. Constitutional: He is oriented to person, place, and time. He appears well-developed and well-nourished.  HENT:  Head: Normocephalic and atraumatic.  Right Ear: External ear normal.  Left Ear: External ear normal.  Nose: Nose normal.  Eyes: Right eye exhibits no discharge. Left eye exhibits no discharge.  Neck: Neck supple.  Cardiovascular: Normal rate, regular rhythm, normal heart sounds and intact distal pulses.   Pulmonary/Chest: Effort normal and breath sounds normal.  Abdominal: Soft. There is no tenderness.  Musculoskeletal: He exhibits no edema.       Left hip: He exhibits decreased range of motion and tenderness.  Neurological: He is alert and oriented to person, place, and time. He has normal strength. No sensory deficit.  Skin: Skin is warm and dry.    ED Course  Procedures (including critical care time) Labs Review Labs Reviewed  PSA  CBC WITH DIFFERENTIAL  COMPREHENSIVE METABOLIC PANEL  URINALYSIS, ROUTINE W REFLEX MICROSCOPIC   Imaging Review Mr Hip Left Wo Contrast  07/31/2013   CLINICAL DATA:  Severe left hip pain. Abnormal radiographs dated 07/15/2013  EXAM: MRI OF THE LEFT HIP WITHOUT CONTRAST  TECHNIQUE: Multiplanar, multisequence MR imaging was performed. No intravenous contrast was administered.  COMPARISON:  Radiographs dated 07/15/2013  FINDINGS: There is a 6 x 6 x 5 cm inhomogeneous destructive soft tissue mass destroying the  anterior medial and anterior superior aspects of the left acetabulum and the lateral aspect of the left superior pubic ramus. The tumor protrudes into the pelvis. Tumor extends into the left internal obturator muscle.  There is an expansile 2 cm mass in the posterior aspect of the L5 vertebral body slightly protruding into the spinal canal. There is a 2.5 cm destructive lesion in the posterior aspect of the left iliac bone adjacent to the superior aspect of the left sacroiliac joint. There is a 16 mm destructive lesion in the posterior lateral aspect of the proximal right femur just below the right greater trochanter. There is a 4 cm destructive lesion in the medullary canal of the proximal right femoral shaft.  The patient does have fairly severe arthritis of the superior aspect of the left femoral head with joint space narrowing and degenerative changes of the remaining portion of the left acetabulum. There small bilateral hip effusions. There is extensive edema in the adjacent soft tissues around the left hip. The edema extends into the left side of the pelvis.  2.7 cm cyst in the right groin. This is of unknown etiology but appears benign.  IMPRESSION: 1. Numerous destructive bone lesions, with the largest lesion destroying the left acetabulum, consistent with metastatic disease. 2. 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 3. Critical Value/emergent results were called by telephone at the time of interpretation on 07/31/2013 at 1:15 PM to Dr.KELLIE [facility] , who verbally acknowledged these results.   Electronically Signed   By: Geanie Cooley M.D.   On: 07/31/2013 13:25   Dg Chest Portable 1 View  07/31/2013   CLINICAL DATA:  The shortness breath. DVT.  EXAM: PORTABLE CHEST - 1 VIEW  COMPARISON:  07/15/2013 and 12/02/2009  FINDINGS: Right base wedge-shaped consolidation. This may represent atelectasis. Pulmonary infarct or infiltrate not excluded. Recommend followup until clearance.  Elevated right  hemidiaphragm. This limits evaluation of the right lung base.  Pulmonary vascular prominence most notable centrally.  Cardiomegaly.  Tortuous aorta.  The previously questioned nodule adjacent to the left hemidiaphragm is not delineated on the present exam. Recommend followup two view chest when able with attention to this region.  IMPRESSION: Right base wedge-shaped consolidation. This may represent atelectasis. Pulmonary infarct or infiltrate not excluded.  Pulmonary vascular prominence most notable centrally.  Cardiomegaly.  Tortuous aorta.  The previously questioned nodule adjacent to the left hemidiaphragm is not delineated on the present exam. Recommend followup two view chest when able with attention to this region.   Electronically Signed   By: Bridgett Larsson M.D.   On: 07/31/2013 17:52    EKG Interpretation   None       MDM   1. Lytic bone lesion of hip  Patient has lytic lesions in pelvis and hip. These are seen on the MRI done earlier today. Given IV morphine for pain. Basic labs and chest x-ray done in the ER but will need more extensive cancer workup as an inpatient. He is unable to walk and thus cannot go home. We'll discuss with the hospitalist remission and orthopedics for his hip. He has a chronic cough has not changed and does not have a shortness of breath, fever, or change in his cough so do not feel that this is necessarily a pneumonia.    Audree Camel, MD 07/31/13 [id number]

## 2013-07-31 NOTE — Consult Note (Signed)
[name] is an 62 y.o. male.    Chief Complaint: left hip pain  HPI: 62 y/o male c/o left hip and lumbar pain for the past 3 weeks. Has seen his PCP and physician at dialysis for workup. Plain films and MRI of left hip/pelvis show multiple lytic lesions in the superior and inferior pubic rami. Pt c/o worsening pain with weight bearing and currently pain even at rest. Denies any falls or injuries. Denies any numbness or tingling distally.   PCP:  Dyke Maes, MD  PMH: Past Medical History  Diagnosis Date  . Renal disorder   . Hypertension   . Dialysis patient   . Secondary hyperparathyroidism (of renal origin)     s/p parathyroidectomy  . Anemia due to chronic disease treated with erythropoietin     PSH: Past Surgical History  Procedure Laterality Date  . Av fistula placement      Social History:  reports that he has been smoking.  He does not have any smokeless tobacco history on file. He reports that he does not drink alcohol. His drug history is not on file.  Allergies:  No Known Allergies  Medications: No current facility-administered medications for this encounter.   Current Outpatient Prescriptions  Medication Sig Dispense Refill  . amLODipine (NORVASC) 10 MG tablet Take 10 mg by mouth at bedtime.      . calcium carbonate (TUMS - DOSED IN MG ELEMENTAL CALCIUM) 500 MG chewable tablet Chew 3 tablets by mouth daily.      Marland Kitchen doxepin (SINEQUAN) 25 MG capsule Take 25 mg by mouth at bedtime.      Marland Kitchen losartan (COZAAR) 25 MG tablet Take 25 mg by mouth at bedtime.      . traMADol (ULTRAM) 50 MG tablet Take 50 mg by mouth every 6 (six) hours as needed for pain (pain).        Results for orders placed during the hospital encounter of 07/31/13 (from the past 48 hour(s))  CBC WITH DIFFERENTIAL     Status: Abnormal   Collection Time    07/31/13  5:25 PM      Result Value Range   WBC 9.6  4.0 - 10.5 K/uL   RBC 3.86 (*) 4.22 - 5.81 MIL/uL   Hemoglobin 10.1 (*) 13.0 -  17.0 g/dL   HCT 16.1 (*) 09.6 - 04.5 %   MCV 75.6 (*) 78.0 - 100.0 fL   MCH 26.2  26.0 - 34.0 pg   MCHC 34.6  30.0 - 36.0 g/dL   RDW 40.9 (*) 81.1 - 91.4 %   Platelets 306  150 - 400 K/uL   Neutrophils Relative % 63  43 - 77 %   Neutro Abs 6.1  1.7 - 7.7 K/uL   Lymphocytes Relative 22  12 - 46 %   Lymphs Abs 2.1  0.7 - 4.0 K/uL   Monocytes Relative 14 (*) 3 - 12 %   Monocytes Absolute 1.3 (*) 0.1 - 1.0 K/uL   Eosinophils Relative 2  0 - 5 %   Eosinophils Absolute 0.1  0.0 - 0.7 K/uL   Basophils Relative 0  0 - 1 %   Basophils Absolute 0.0  0.0 - 0.1 K/uL  COMPREHENSIVE METABOLIC PANEL     Status: Abnormal   Collection Time    07/31/13  5:25 PM      Result Value Range   Sodium 140  135 - 145 mEq/L   Potassium 4.8  3.5 - 5.1 mEq/L  Chloride 97  96 - 112 mEq/L   CO2 28  19 - 32 mEq/L   Glucose, Bld 85  70 - 99 mg/dL   BUN 42 (*) 6 - 23 mg/dL   Creatinine, Ser 1.61 (*) 0.50 - 1.35 mg/dL   Calcium 09.6 (*) 8.4 - 10.5 mg/dL   Total Protein 8.1  6.0 - 8.3 g/dL   Albumin 3.3 (*) 3.5 - 5.2 g/dL   AST 14  0 - 37 U/L   ALT 13  0 - 53 U/L   Alkaline Phosphatase 88  39 - 117 U/L   Total Bilirubin 0.5  0.3 - 1.2 mg/dL   GFR calc non Af Amer 8 (*) >90 mL/min   GFR calc Af Amer 10 (*) >90 mL/min   Comment: (NOTE)     The eGFR has been calculated using the CKD EPI equation.     This calculation has not been validated in all clinical situations.     eGFR's persistently <90 mL/min signify possible Chronic Kidney     Disease.   Mr Hip Left Wo Contrast  07/31/2013   CLINICAL DATA:  Severe left hip pain. Abnormal radiographs dated 07/15/2013  EXAM: MRI OF THE LEFT HIP WITHOUT CONTRAST  TECHNIQUE: Multiplanar, multisequence MR imaging was performed. No intravenous contrast was administered.  COMPARISON:  Radiographs dated 07/15/2013  FINDINGS: There is a 6 x 6 x 5 cm inhomogeneous destructive soft tissue mass destroying the anterior medial and anterior superior aspects of the left acetabulum  and the lateral aspect of the left superior pubic ramus. The tumor protrudes into the pelvis. Tumor extends into the left internal obturator muscle.  There is an expansile 2 cm mass in the posterior aspect of the L5 vertebral body slightly protruding into the spinal canal. There is a 2.5 cm destructive lesion in the posterior aspect of the left iliac bone adjacent to the superior aspect of the left sacroiliac joint. There is a 16 mm destructive lesion in the posterior lateral aspect of the proximal right femur just below the right greater trochanter. There is a 4 cm destructive lesion in the medullary canal of the proximal right femoral shaft.  The patient does have fairly severe arthritis of the superior aspect of the left femoral head with joint space narrowing and degenerative changes of the remaining portion of the left acetabulum. There small bilateral hip effusions. There is extensive edema in the adjacent soft tissues around the left hip. The edema extends into the left side of the pelvis.  2.7 cm cyst in the right groin. This is of unknown etiology but appears benign.  IMPRESSION: 1. Numerous destructive bone lesions, with the largest lesion destroying the left acetabulum, consistent with metastatic disease. 2. 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 3. Critical Value/emergent results were called by telephone at the time of interpretation on 07/31/2013 at 1:15 PM to Dr.KELLIE [facility] , who verbally acknowledged these results.   Electronically Signed   By: Geanie Cooley M.D.   On: 07/31/2013 13:25   Dg Chest Portable 1 View  07/31/2013   CLINICAL DATA:  The shortness breath. DVT.  EXAM: PORTABLE CHEST - 1 VIEW  COMPARISON:  07/15/2013 and 12/02/2009  FINDINGS: Right base wedge-shaped consolidation. This may represent atelectasis. Pulmonary infarct or infiltrate not excluded. Recommend followup until clearance.  Elevated right hemidiaphragm. This limits evaluation of the right lung base.   Pulmonary vascular prominence most notable centrally.  Cardiomegaly.  Tortuous aorta.  The previously questioned nodule  adjacent to the left hemidiaphragm is not delineated on the present exam. Recommend followup two view chest when able with attention to this region.  IMPRESSION: Right base wedge-shaped consolidation. This may represent atelectasis. Pulmonary infarct or infiltrate not excluded.  Pulmonary vascular prominence most notable centrally.  Cardiomegaly.  Tortuous aorta.  The previously questioned nodule adjacent to the left hemidiaphragm is not delineated on the present exam. Recommend followup two view chest when able with attention to this region.   Electronically Signed   By: Bridgett Larsson M.D.   On: 07/31/2013 17:52    ROS: Dialysis for past 14 years  Increasing pain over the past 3 weeks in left hip and lumbar spine  ROS  Physical Exam: Alert and appropriate 62 y/o male in no acute distress Full rom of bilateral upper extremities with no deformities Mild edema in right arm where current dialysis shunt remains nv intact distally Moderate tenderness to left pelvis Pain with log roll of left leg nv intact distally Right lower extremity with full rom, no tenderness No rashes or edema  Physical Exam   MRI pelvis: multiple lytic lesions of superior and inferior pubic rami consistent with mets   Assessment/Plan Assessment:  Left pelvis lytic lesions suspicious for bony mets   Plan: will obtain lumbar films to rule out further mets due to his pain Recommend admission by medical team to determine primary lesion  Will discuss these findings with our hip specialist to determine if any surgical measure would be appropriate vs chemo and radiation  Pain control Recommend non weight bearing left lower extremity Will continue to follow his progress    @MSBSIG @

## 2013-07-31 NOTE — Consult Note (Signed)
[facility] Renal Consultation Note    Indication for Consultation:  Management of ESRD/hemodialysis; anemia, hypertension/volume and secondary hyperparathyroidism  HPI: [name] is a 62 y.o. male with ESRD, HTN on dialysis TTS at [place] with a several month history of hip and rib pain worse after a fall.  He was sent for CXR and hip xray two weeks ago that showed acute or subacute right rib fx, nodule and small right pleural effusion and also "Suspected lucent/lytic expansile lesion of the lateral portion of the left superior pubic ramus with underlying pathologic fractures potentially extending into the quadrilateral plate of the acetabulum. Sclerotic lesion in the left inferior pubic ramus suspicious for metastatic lesion, potentially with internal stress fracture."  MRI today showed "Numerous destructive bone lesions, with the largest lesion  destroying the left acetabulum, consistent with metastatic disease.."  Dr. Kathrene Bongo was notified of these results. Dr. Hyman Hopes, the hospital nephrologist discussed the findings with the patient and his sister.  He also notes the onset of swelling in his right arm (his access arm)several weeks ago.  He has a longstanding chronic cough, no new SOB, no CP, no fevers or chills.  He makes small amounts of urine, has a good appetite and sister corroborates that he eats well.  He does have a long smoking history and has a family history of smoking and lung cancer.  He is being admitted for work up of MRI findings and is due for dialysis tomorrow.   Past Medical History  Diagnosis Date  . Renal disorder   . Hypertension   . Dialysis patient   . Secondary hyperparathyroidism (of renal origin)     s/p parathyroidectomy  . Anemia due to chronic disease treated with erythropoietin    Past Surgical History  Procedure Laterality Date  . Av fistula placement     Family History  Problem Relation Age of Onset  . Lung cancer     Social History:  Lives with sister  reports that he has been smoking.  He does not have any smokeless tobacco history on file. He reports that he does not drink alcohol. His drug history is not on file. No Known Allergies Prior to Admission medications   Medication Sig Start Date End Date Taking? Authorizing Provider  amLODipine (NORVASC) 10 MG tablet Take 10 mg by mouth at bedtime.   Yes Historical Provider, MD  calcium carbonate (TUMS - DOSED IN MG ELEMENTAL CALCIUM) 500 MG chewable tablet Chew 3 tablets by mouth daily.   Yes Historical Provider, MD  doxepin (SINEQUAN) 25 MG capsule Take 25 mg by mouth at bedtime.   Yes Historical Provider, MD  losartan (COZAAR) 25 MG tablet Take 25 mg by mouth at bedtime.   Yes Historical Provider, MD  traMADol (ULTRAM) 50 MG tablet Take 50 mg by mouth every 6 (six) hours as needed for pain (pain).   Yes Historical Provider, MD   No current facility-administered medications for this encounter.   Current Outpatient Prescriptions  Medication Sig Dispense Refill  . amLODipine (NORVASC) 10 MG tablet Take 10 mg by mouth at bedtime.      . calcium carbonate (TUMS - DOSED IN MG ELEMENTAL CALCIUM) 500 MG chewable tablet Chew 3 tablets by mouth daily.      Marland Kitchen doxepin (SINEQUAN) 25 MG capsule Take 25 mg by mouth at bedtime.      Marland Kitchen losartan (COZAAR) 25 MG tablet Take 25 mg by mouth at bedtime.      . traMADol (  ULTRAM) 50 MG tablet Take 50 mg by mouth every 6 (six) hours as needed for pain (pain).       Labs: Basic Metabolic Panel:  Recent Labs Lab 07/31/13 1725  NA 140  K 4.8  CL 97  CO2 28  GLUCOSE 85  BUN 42*  CREATININE 6.50*  CALCIUM 10.6*   Liver Function Tests:  Recent Labs Lab 07/31/13 1725  AST 14  ALT 13  ALKPHOS 88  BILITOT 0.5  PROT 8.1  ALBUMIN 3.3*   CBC:  Recent Labs Lab 07/31/13 1725  WBC 9.6  NEUTROABS 6.1  HGB 10.1*  HCT 29.2*  MCV 75.6*  PLT 306  Studies/Results: Mr Hip Left Wo Contrast  07/31/2013   CLINICAL DATA:  Severe left  hip pain. Abnormal radiographs dated 07/15/2013  EXAM: MRI OF THE LEFT HIP WITHOUT CONTRAST  TECHNIQUE: Multiplanar, multisequence MR imaging was performed. No intravenous contrast was administered.  COMPARISON:  Radiographs dated 07/15/2013  FINDINGS: There is a 6 x 6 x 5 cm inhomogeneous destructive soft tissue mass destroying the anterior medial and anterior superior aspects of the left acetabulum and the lateral aspect of the left superior pubic ramus. The tumor protrudes into the pelvis. Tumor extends into the left internal obturator muscle.  There is an expansile 2 cm mass in the posterior aspect of the L5 vertebral body slightly protruding into the spinal canal. There is a 2.5 cm destructive lesion in the posterior aspect of the left iliac bone adjacent to the superior aspect of the left sacroiliac joint. There is a 16 mm destructive lesion in the posterior lateral aspect of the proximal right femur just below the right greater trochanter. There is a 4 cm destructive lesion in the medullary canal of the proximal right femoral shaft.  The patient does have fairly severe arthritis of the superior aspect of the left femoral head with joint space narrowing and degenerative changes of the remaining portion of the left acetabulum. There small bilateral hip effusions. There is extensive edema in the adjacent soft tissues around the left hip. The edema extends into the left side of the pelvis.  2.7 cm cyst in the right groin. This is of unknown etiology but appears benign.  IMPRESSION: 1. Numerous destructive bone lesions, with the largest lesion destroying the left acetabulum, consistent with metastatic disease. 2. 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 3. Critical Value/emergent results were called by telephone at the time of interpretation on 07/31/2013 at 1:15 PM to Dr.KELLIE [facility] , who verbally acknowledged these results.   Electronically Signed   By: Geanie Cooley M.D.   On: 07/31/2013 13:25   Dg  Chest Portable 1 View  07/31/2013   CLINICAL DATA:  The shortness breath. DVT.  EXAM: PORTABLE CHEST - 1 VIEW  COMPARISON:  07/15/2013 and 12/02/2009  FINDINGS: Right base wedge-shaped consolidation. This may represent atelectasis. Pulmonary infarct or infiltrate not excluded. Recommend followup until clearance.  Elevated right hemidiaphragm. This limits evaluation of the right lung base.  Pulmonary vascular prominence most notable centrally.  Cardiomegaly.  Tortuous aorta.  The previously questioned nodule adjacent to the left hemidiaphragm is not delineated on the present exam. Recommend followup two view chest when able with attention to this region.  IMPRESSION: Right base wedge-shaped consolidation. This may represent atelectasis. Pulmonary infarct or infiltrate not excluded.  Pulmonary vascular prominence most notable centrally.  Cardiomegaly.  Tortuous aorta.  The previously questioned nodule adjacent to the left hemidiaphragm is not delineated on the  present exam. Recommend followup two view chest when able with attention to this region.   Electronically Signed   By: Bridgett Larsson M.D.   On: 07/31/2013 17:52   ROS: As per HPI otherwise neg   Physical Exam: Filed Vitals:   07/31/13 1336 07/31/13 1742  BP: 134/77 150/87  Pulse: 83 84  Temp: 98.4 F (36.9 C)   TempSrc: Oral   Resp: 16   SpO2: 96% 96%     General: Well developed NAD moves about with great pain  Head: Normocephalic, atraumatic, sclera non-icteric,facial puffiness, mucus membranes are moist, dentitian in very poor condition, many missing teeth Neck: Supple. JVD not elevated. No LAD, scars from prior catheters Lungs: Clear bilaterally to auscultation without wheezes, rales, or rhonchi. Breathing is unlabored. Heart: RRR with S1 S2. No murmurs, rubs, or gallops appreciated. Abdomen: Soft, non-tender, non-distended with normoactive bowel sounds. No rebound/guarding. No obvious abdominal masses; Lower extremities:without edema  or ischemic changes, no open wounds  Neuro: Alert and oriented X 3. Moves all extremities spontaneously. Psych:  Responds to questions appropriately with a normal affect. Dialysis Access: right upper AVF + bruit; right arm with mod generalized swelling from hand to shoulder  Dialysis Orders: [facility] Optiflux 160 2K 3.5 Ca bath, Qb 450 FR A 1.5 EDW 60 (gets to edw gains 1-2 L) right upper AVF no profile, no var Na Hectorol1, Epo 1200 (just increased from 1000) venofer 50/week  Recent labs:  Hgb 9.8 - usual range +/- 10; ferritin 1000 30% sat; iPTH <7 Ca 10.4 usuall 9s, P ok  Assessment/Plan: 1. Probable metastatic disease of unknown primary in a smoker - work up as directed by primary 2. ESRD -  TTS - HD tomorrow - per routine; pt hypercalcemic - will change bath to 2.5 and stop hectorol; normally on 3.5 Ca bath 3. Hypertension/volume  - BP up some; easily gets to edw with low volume; titrate EDW down slightly 4. Anemia  - Hgb 10.1 at usual range with low dose Epo - will give Aranesp 25 and continue weekly iron 5. Metabolic bone disease -  S/p parathyroidectomy- with hypercalcemia as per #2 - d/c hectorol 6. Nutrition - renal diet + vitamins 7. Swollen right arm - could be related to metastatic process or could be central stenosis from prior dialysis catheters - had similar situation in August 2012 when he had a new access and right I-J; the swelling spontaneously resolved when catheter was removed; access flows have been ok, but would favor checking a fistulagram while here.  Sheffield Slider, PA-C [facility] Beeper [phone] 07/31/2013, 6:13 PM

## 2013-07-31 NOTE — ED Notes (Signed)
Brigitte Pulse- PT [phone]

## 2013-07-31 NOTE — ED Notes (Signed)
Per Dr. Hyman Hopes, pt's MD, PLEASE DO NOT HAVE PT STAND ON LEFT LEG!!!!  MD concerned for metastatic breakdown of left hip.

## 2013-07-31 NOTE — ED Notes (Addendum)
Went to the dr today for left hip pain had xrays  And was found to have bone mets and they need him admitted pt DOES NOT KNOW. PT IS DIALYSIS pt and goes T Thu SAt

## 2013-07-31 NOTE — H&P (Signed)
[facility] History and Physical  Krue Peterka NUU:[id number] DOB: January 12, 1951 DOA: 07/31/2013  Referring physician: EDP PCP: Dyke Maes, MD  Outpatient Specialists:  1. Nephrology: Dr. Primitivo Gauze.  Chief Complaint: Left hip pain.  HPI: Dayle Mcnerney is a 62 y.o. male with history of ESRD on TTS hemodialysis, hypertension, ongoing tobacco abuse, presented to the ED on his nephrologist advise for evaluation of worsening left hip pain. Patient states that his left hip pain actually started a couple of months ago during the summer when he was doing some gardening. He was apparently acute in onset. Since then he has been using pain medications with some relief. He claims to have brought this to the attention of the dialysis staff multiple times. He also sustained a fall and had some rib pain. Of late, pain has gotten progressively worse-constant, severe, some improvement with pain medications, worsened by weight bearing. He was sent for chest x-ray and hip x-ray 2 weeks ago that showed small right pleural effusion, atelectasis, atelectasis or pulmonary nodule along left hemidiaphragm and acute or subacute posterior lateral sixth rib fracture & suspected loosening/lytic expansile lesion of the lateral portion of the left superior pubic rami with underlying pathological fractures potentially extending into the quadrilateral plate of the acetabulum, sclerotic lesion in the left inferior pubic ramus suspicious for metastatic lesion. MRI today shows numerous district or bone lesions with the largest lesion destroying the left acetabulum consistent with metastatic disease. Hospitalist admission requested. EDP has consulted nephrology and orthopedics.   Review of Systems: All systems reviewed and apart from history of presenting illness, are negative.  Past Medical History  Diagnosis Date  . Renal disorder   . Hypertension   . Dialysis patient   . Secondary hyperparathyroidism (of  renal origin)     s/p parathyroidectomy  . Anemia due to chronic disease treated with erythropoietin    Past Surgical History  Procedure Laterality Date  . Av fistula placement     Social History:  reports that he has been smoking.  He does not have any smokeless tobacco history on file. He reports that he does not drink alcohol. His drug history is not on file. Single. Patient lives with his sister and was ambulant prior to admission.   No Known Allergies  Family History  Problem Relation Age of Onset  . Lung cancer      Prior to Admission medications   Medication Sig Start Date End Date Taking? Authorizing Provider  amLODipine (NORVASC) 10 MG tablet Take 10 mg by mouth at bedtime.   Yes Historical Provider, MD  calcium carbonate (TUMS - DOSED IN MG ELEMENTAL CALCIUM) 500 MG chewable tablet Chew 3 tablets by mouth daily.   Yes Historical Provider, MD  doxepin (SINEQUAN) 25 MG capsule Take 25 mg by mouth at bedtime.   Yes Historical Provider, MD  losartan (COZAAR) 25 MG tablet Take 25 mg by mouth at bedtime.   Yes Historical Provider, MD  traMADol (ULTRAM) 50 MG tablet Take 50 mg by mouth every 6 (six) hours as needed for pain (pain).   Yes Historical Provider, MD   Physical Exam: Filed Vitals:   07/31/13 1800 07/31/13 1815 07/31/13 1821 07/31/13 1845  BP: 152/89 145/93 145/93 145/92  Pulse: 85 84 83   Temp:      TempSrc:      Resp:   18 20  SpO2: 91% 90% 90%      General exam: Moderately built and nourished male patient, lying supine on the  gurney in  mild painful  distress.  Head, eyes and ENT: Nontraumatic and normocephalic. Pupils equally reacting to light and accommodation. Oral mucosa moist.  Neck: Supple. No JVD, carotid bruit or thyromegaly.  Lymphatics: No lymphadenopathy.  Respiratory system: Clear to auscultation. No increased work of breathing.  Cardiovascular system: S1 and S2 heard, RRR. No JVD, murmurs, gallops, clicks or pedal edema.  Gastrointestinal  system: Abdomen is nondistended, soft and nontender. Normal bowel sounds heard. No organomegaly or masses appreciated.  Central nervous system: Alert and oriented. No focal neurological deficits.  Extremities: Symmetric 5 x 5 power. Peripheral pulses symmetrically felt. Limited evaluation of left lower extremity secondary to left hip pain. No local swelling or warmth but is tender to palpation. Thrill of AV fistula and right upper arm and? Non- functioning fistula on left upper arm  Skin: No rashes or acute findings.  Musculoskeletal system: Negative exam.  Psychiatry: Pleasant and cooperative.   Labs on Admission:  Basic Metabolic Panel:  Recent Labs Lab 07/31/13 1725  NA 140  K 4.8  CL 97  CO2 28  GLUCOSE 85  BUN 42*  CREATININE 6.50*  CALCIUM 10.6*   Liver Function Tests:  Recent Labs Lab 07/31/13 1725  AST 14  ALT 13  ALKPHOS 88  BILITOT 0.5  PROT 8.1  ALBUMIN 3.3*   No results found for this basename: LIPASE, AMYLASE,  in the last 168 hours No results found for this basename: AMMONIA,  in the last 168 hours CBC:  Recent Labs Lab 07/31/13 1725  WBC 9.6  NEUTROABS 6.1  HGB 10.1*  HCT 29.2*  MCV 75.6*  PLT 306   Cardiac Enzymes: No results found for this basename: CKTOTAL, CKMB, CKMBINDEX, TROPONINI,  in the last 168 hours  BNP (last 3 results) No results found for this basename: PROBNP,  in the last 8760 hours CBG: No results found for this basename: GLUCAP,  in the last 168 hours  Radiological Exams on Admission: Mr Hip Left Wo Contrast  07/31/2013   CLINICAL DATA:  Severe left hip pain. Abnormal radiographs dated 07/15/2013  EXAM: MRI OF THE LEFT HIP WITHOUT CONTRAST  TECHNIQUE: Multiplanar, multisequence MR imaging was performed. No intravenous contrast was administered.  COMPARISON:  Radiographs dated 07/15/2013  FINDINGS: There is a 6 x 6 x 5 cm inhomogeneous destructive soft tissue mass destroying the anterior medial and anterior superior  aspects of the left acetabulum and the lateral aspect of the left superior pubic ramus. The tumor protrudes into the pelvis. Tumor extends into the left internal obturator muscle.  There is an expansile 2 cm mass in the posterior aspect of the L5 vertebral body slightly protruding into the spinal canal. There is a 2.5 cm destructive lesion in the posterior aspect of the left iliac bone adjacent to the superior aspect of the left sacroiliac joint. There is a 16 mm destructive lesion in the posterior lateral aspect of the proximal right femur just below the right greater trochanter. There is a 4 cm destructive lesion in the medullary canal of the proximal right femoral shaft.  The patient does have fairly severe arthritis of the superior aspect of the left femoral head with joint space narrowing and degenerative changes of the remaining portion of the left acetabulum. There small bilateral hip effusions. There is extensive edema in the adjacent soft tissues around the left hip. The edema extends into the left side of the pelvis.  2.7 cm cyst in the right groin. This is of  unknown etiology but appears benign.  IMPRESSION: 1. Numerous destructive bone lesions, with the largest lesion destroying the left acetabulum, consistent with metastatic disease. 2. 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 3. Critical Value/emergent results were called by telephone at the time of interpretation on 07/31/2013 at 1:15 PM to Dr.KELLIE Litzenberg [facility] , who verbally acknowledged these results.   Electronically Signed   By: Geanie Cooley M.D.   On: 07/31/2013 13:25   Dg Chest Portable 1 View  07/31/2013   CLINICAL DATA:  The shortness breath. DVT.  EXAM: PORTABLE CHEST - 1 VIEW  COMPARISON:  07/15/2013 and 12/02/2009  FINDINGS: Right base wedge-shaped consolidation. This may represent atelectasis. Pulmonary infarct or infiltrate not excluded. Recommend followup until clearance.  Elevated right hemidiaphragm. This limits evaluation of  the right lung base.  Pulmonary vascular prominence most notable centrally.  Cardiomegaly.  Tortuous aorta.  The previously questioned nodule adjacent to the left hemidiaphragm is not delineated on the present exam. Recommend followup two view chest when able with attention to this region.  IMPRESSION: Right base wedge-shaped consolidation. This may represent atelectasis. Pulmonary infarct or infiltrate not excluded.  Pulmonary vascular prominence most notable centrally.  Cardiomegaly.  Tortuous aorta.  The previously questioned nodule adjacent to the left hemidiaphragm is not delineated on the present exam. Recommend followup two view chest when able with attention to this region.   Electronically Signed   By: Bridgett Larsson M.D.   On: 07/31/2013 17:52    EKG: Independently reviewed.  sinus rhythm, LVH, occasional PVCs without acute changes.  Assessment/Plan Principal Problem:   Pathological fracture- of left pubic rami Active Problems:   Left hip pain   Metastatic carcinoma   ESRD on hemodialysis   Anemia   Hypertension   Tobacco abuse    Left hip pain/metastatic disease to left pubic rami & acetabulum - Etiology of primary unknown. - Admit to medical floor. - Abnormality on chest x-ray in right base: Rule out primary lung CA. - Will obtain CT chest, abdomen & pelvis with contrast to further evaluate. (Discussed with nephrology team who are okay with contrasted CT) - Check UPEP & SPEP. - Bedrest and nonweightbearing on left lower extremity. - Orthopedic consulted by EDP-patient may eventually require stabilization surgery for left hip.   ESRD on dialysis - Management per nephrology.  Hypertension - Controlled.  Anemia - Secondary to chronic kidney disease - Management per nephrology.  Tobacco abuse - Cessation counseled.  Code Status: Full  Family Communication: None  Disposition Plan: To be determined   Time spent: 65 minutes  HONGALGI,ANAND, MD, FACP, FHM. [facility] Pager [phone]  If 7PM-7AM, please contact night-coverage [email or web address] Password [email or web address] 07/31/2013, 7:12 PM

## 2013-07-31 NOTE — Consult Note (Signed)
I have seen and examined this patient and agree with the plan of care . Plan dialysis in AM. Destructive lesion in Left Hip suspicious for a malignancy Geraldene Eisel W 07/31/2013, 7:32 PM

## 2013-08-01 ENCOUNTER — Encounter (HOSPITAL_COMMUNITY): Payer: Self-pay | Admitting: Radiology

## 2013-08-01 DIAGNOSIS — R911 Solitary pulmonary nodule: Secondary | ICD-10-CM

## 2013-08-01 DIAGNOSIS — D649 Anemia, unspecified: Secondary | ICD-10-CM

## 2013-08-01 DIAGNOSIS — F17200 Nicotine dependence, unspecified, uncomplicated: Secondary | ICD-10-CM

## 2013-08-01 DIAGNOSIS — B1920 Unspecified viral hepatitis C without hepatic coma: Secondary | ICD-10-CM | POA: Diagnosis present

## 2013-08-01 DIAGNOSIS — N2581 Secondary hyperparathyroidism of renal origin: Secondary | ICD-10-CM | POA: Diagnosis present

## 2013-08-01 DIAGNOSIS — C801 Malignant (primary) neoplasm, unspecified: Secondary | ICD-10-CM

## 2013-08-01 DIAGNOSIS — M899 Disorder of bone, unspecified: Secondary | ICD-10-CM

## 2013-08-01 DIAGNOSIS — R599 Enlarged lymph nodes, unspecified: Secondary | ICD-10-CM

## 2013-08-01 LAB — RENAL FUNCTION PANEL
Albumin: 3 g/dL — ABNORMAL LOW (ref 3.5–5.2)
BUN: 51 mg/dL — ABNORMAL HIGH (ref 6–23)
CO2: 27 mEq/L (ref 19–32)
Calcium: 10.1 mg/dL (ref 8.4–10.5)
Chloride: 97 mEq/L (ref 96–112)
Creatinine, Ser: 7.7 mg/dL — ABNORMAL HIGH (ref 0.50–1.35)
GFR calc Af Amer: 8 mL/min — ABNORMAL LOW (ref >90)
GFR calc non Af Amer: 7 mL/min — ABNORMAL LOW (ref >90)
Glucose, Bld: 145 mg/dL — ABNORMAL HIGH (ref 70–99)
Phosphorus: 3.7 mg/dL (ref 2.3–4.6)
Potassium: 4.5 mEq/L (ref 3.5–5.1)
Sodium: 139 mEq/L (ref 135–145)

## 2013-08-01 LAB — CBC
HCT: 28.2 % — ABNORMAL LOW (ref 39.0–52.0)
Hemoglobin: 10 g/dL — ABNORMAL LOW (ref 13.0–17.0)
MCH: 26.3 pg (ref 26.0–34.0)
MCHC: 35.5 g/dL (ref 30.0–36.0)
MCV: 74.2 fL — ABNORMAL LOW (ref 78.0–100.0)
Platelets: 337 10*3/uL (ref 150–400)
RBC: 3.8 MIL/uL — ABNORMAL LOW (ref 4.22–5.81)
RDW: 17.3 % — ABNORMAL HIGH (ref 11.5–15.5)
WBC: 7.8 10*3/uL (ref 4.0–10.5)

## 2013-08-01 LAB — PSA: PSA: 1.32 ng/mL (ref <=4.00)

## 2013-08-01 MED ORDER — ZOLPIDEM TARTRATE 5 MG PO TABS
5.0000 mg | ORAL_TABLET | Freq: Once | ORAL | Status: AC
  Administered 2013-08-01: 5 mg via ORAL
  Filled 2013-08-01: qty 1

## 2013-08-01 MED ORDER — DARBEPOETIN ALFA-POLYSORBATE 100 MCG/0.5ML IJ SOLN
100.0000 ug | INTRAMUSCULAR | Status: DC
  Administered 2013-08-01 – 2013-08-08 (×2): 100 ug via INTRAVENOUS
  Filled 2013-08-01: qty 0.5

## 2013-08-01 MED ORDER — DARBEPOETIN ALFA-POLYSORBATE 100 MCG/0.5ML IJ SOLN
INTRAMUSCULAR | Status: AC
  Filled 2013-08-01: qty 0.5

## 2013-08-01 MED ORDER — PNEUMOCOCCAL VAC POLYVALENT 25 MCG/0.5ML IJ INJ
0.5000 mL | INJECTION | INTRAMUSCULAR | Status: AC
  Administered 2013-08-02: 0.5 mL via INTRAMUSCULAR
  Filled 2013-08-01: qty 0.5

## 2013-08-01 MED ORDER — RENA-VITE PO TABS
1.0000 | ORAL_TABLET | Freq: Every day | ORAL | Status: DC
  Administered 2013-08-01 – 2013-08-07 (×7): 1 via ORAL
  Filled 2013-08-01 (×8): qty 1

## 2013-08-01 MED ORDER — HEPARIN SODIUM (PORCINE) 5000 UNIT/ML IJ SOLN
5000.0000 [IU] | Freq: Three times a day (TID) | INTRAMUSCULAR | Status: DC
  Administered 2013-08-03 – 2013-08-08 (×16): 5000 [IU] via SUBCUTANEOUS
  Filled 2013-08-01 (×23): qty 1

## 2013-08-01 NOTE — Progress Notes (Signed)
Orthopedics Progress Note  Subjective: My left hip hurts  Objective:  Filed Vitals:   08/01/13 0604  BP: 138/79  Pulse: 80  Temp: 98.4 F (36.9 C)  Resp: 18    General: Awake and alert  Musculoskeletal: pain with AROM of the left hip, leg lengths equal, NVI distally left LE Neurovascularly intact  Lab Results  Component Value Date   WBC 9.6 07/31/2013   HGB 10.1* 07/31/2013   HCT 29.2* 07/31/2013   MCV 75.6* 07/31/2013   PLT 306 07/31/2013       Component Value Date/Time   NA 140 07/31/2013 1725   K 4.8 07/31/2013 1725   CL 97 07/31/2013 1725   CO2 28 07/31/2013 1725   GLUCOSE 85 07/31/2013 1725   BUN 42* 07/31/2013 1725   CREATININE 6.50* 07/31/2013 1725   CALCIUM 10.6* 07/31/2013 1725   GFRNONAA 8* 07/31/2013 1725   GFRAA 10* 07/31/2013 1725    Lab Results  Component Value Date   INR 0.99 11/19/2009    Assessment/Plan: Left hip pain due to suspected metastatic disease with bone involvement of the acetabulum and proximal femur. The patient is at high risk for periprosthetic fracture.  STRICT non weightbearing left LE. Surgery is not an option at this time due to the tremendous bone loss on the acetabular side.  Would recommend radiation therapy to the left hip for pain control and local control of disease. Will follow   Almedia Balls. Ranell Patrick, MD 08/01/2013 8:01 AM

## 2013-08-01 NOTE — Procedures (Signed)
I was present at this session.  I have reviewed the session itself and made appropriate changes.  Access arm swollen.  Needles too close, staff consulted and patient instruced. bp & vol ok. Access press ok.  Erionna Strum L 10/30/201411:01 AM

## 2013-08-01 NOTE — Progress Notes (Signed)
[facility] Progress Note  Subjective:   Able to stand for weight pre HD  Objective Filed Vitals:   07/31/13 1942 07/31/13 1945 07/31/13 2105 08/01/13 0604  BP: 154/90  161/89 138/79  Pulse: 88 92 95 80  Temp:   97.7 F (36.5 C) 98.4 F (36.9 C)  TempSrc:   Oral Oral  Resp: 24 21 20 18   Height:   5\' 7"  (1.702 m)   Weight:   60.827 kg (134 lb 1.6 oz)   SpO2: 92% 90% 93% 92%   Physical Exam - Hd being initiated General: uncomfortable Heart: RRR Gr2/6 M Lungs: crackles at bases Abdomen: soft Liver down 4 cm, abdm soft Extremities: no sig LE edema Dialysis Access: right upper AVF + bruit; right arm with mod generalized swelling from hand to shoulder   Dialysis Orders: [facility] hr Optiflux 160 2K 3.5 Ca bath, Qb 450 FR A 1.5 EDW 60 (gets to edw gains 1-2 L) right upper AVF no profile, no var Na Hectorol1, Epo 1200 (just increased from 1000) venofer 50/week  Recent labs: Hgb 9.8 - usual range +/- 10; ferritin 1000 30% sat; iPTH <7 Ca 10.4 usuall 9s, P ok   Assessment/Plan:  1. 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 bx 2. ESRD - TTS - HD today - per routine; pt hypercalcemic - will change bath to 2.5 and stop hectorol; normally on 3.5 Ca bath- labs to be drawn 3. Hypertension/volume - BP up some; easily gets to edw with low volume; titrate EDW down slightly- he has lost weight 4. Anemia - Hgb 10.1 at usual range with low dose Epo - will give Aranesp 25 and continue weekly iron 5. Metabolic bone disease - S/p parathyroidectomy- with hypercalcemia as per #2 - d/c hectorol 6. Nutrition - renal diet + vitamins 7. Swollen right arm - could be related to metastatic process or could be central stenosis from prior  dialysis catheters - had similar situation in August 2012 when he had a new access and right I-J; the swelling spontaneously resolved when catheter was removed; access flows have been ok, but would favor checking a fistulagram while here - though this may be the least of his problems. Agree  Sheffield Slider, PA-C [facility] Beeper [phone] 08/01/2013,9:34 AM I have seen and examined this patient and agree with the plan of care seen, examined and eval.  Discussed situation with patient .  Aina Rossbach L 08/01/2013, 11:04 AM   LOS: 1 day    Additional Objective Labs: Basic Metabolic Panel:  Recent Labs Lab 07/31/13 1725  NA 140  K 4.8  CL 97  CO2 28  GLUCOSE 85  BUN 42*  CREATININE 6.50*  CALCIUM 10.6*   Liver Function Tests:  Recent Labs Lab 07/31/13 1725  AST 14  ALT 13  ALKPHOS 88  BILITOT 0.5  PROT 8.1  ALBUMIN 3.3*  CBC:  Recent Labs Lab 07/31/13 1725  WBC 9.6  NEUTROABS 6.1  HGB 10.1*  HCT 29.2*  MCV 75.6*  PLT 306  Studies/Results: Dg Lumbar Spine 2-3 Views  07/31/2013   CLINICAL DATA:  Low back pain with recent metastatic diagnosis  EXAM: LUMBAR SPINE - 2-3 VIEW  COMPARISON:  Contemporaneously  body CT.  FINDINGS: Extensive metastatic disease of the lumbar spine is under appreciated by radiography. There is a lytic lesion in the posterior L5 body and an extensive permeative lesion in the L2 body. There is a superior endplate fracture of L2 noted. Patchy sclerosis, present in the lower L2, upper L3, lower L4, and upper L5 bodies that is most likely degenerative in nature.  IMPRESSION: 1. Metastatic disease in the L2 and L5 bodies. 2. Pathologic L2 superior endplate fracture with mild compression.   Electronically Signed   By: Tiburcio Pea M.D.   On: 07/31/2013 22:23   Ct Chest W Contrast  07/31/2013   CLINICAL DATA:  Bone lesions.  Renal failure.  EXAM: CT CHEST, ABDOMEN, AND PELVIS WITH CONTRAST  TECHNIQUE: Multidetector CT  imaging of the chest, abdomen and pelvis was performed following the standard protocol during bolus administration of intravenous contrast.  CONTRAST:  80mL OMNIPAQUE IOHEXOL 300 MG/ML  SOLN  COMPARISON:  07/11/2005  FINDINGS: CT CHEST FINDINGS  Hyperemic 2.3 cm right paratracheal lymph node. Smaller enlarged precarinal, subcarinal, pretracheal, and prevascular lymph nodes. Trace left small and right pleural effusions. There is a permeative lesion in the proximal right 9th rib with surrounding soft tissue and associated pathologic fracture. Smaller lytic lesion with path fracture through lateral aspect right 6th rib. Old healed left rib fractures. Small subpleural blebs in both upper lobes. There is dependent atelectasis in both lower lobes, right greater than left, with no discrete mass identified.  CT ABDOMEN AND PELVIS FINDINGS  Surgical clips in the gallbladder fossa. Unremarkable liver, spleen, adrenal glands. Innumerable cysts throughout both kidneys. 4.5 cm enhancing mass in the interpolar region of the left kidney. 3.3 cm enhancing mass in the upper pole of the right kidney. Renal veins are patent. No retroperitoneal adenopathy. Atheromatous nondilated abdominal aorta. Stomach, small bowel and colon are nondilated. Expansile lytic enhancing masses involve the superior left pubic ramus and anterior acetabulum, L2 with pathologic fracture, L5 vertebral body, posterior right iliac bone, and intertrochanteric region of the right femur. Enlarged 14 mm right inguinal lymph node. Trace pelvic ascites. No free air.  IMPRESSION: 1. Bilateral enhancing renal masses suggesting either synchronous renal cell carcinoma or metastatic disease. 2. 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 3. Mediastinal adenopathy. 4. Pleural effusions, right greater than left. 5. Innumerable renal cysts suggesting polycystic kidney disease.   Electronically Signed   By:  Oley Balm M.D.   On: 07/31/2013 22:25   Ct Abdomen Pelvis W Contrast  07/31/2013   CLINICAL DATA:  Bone lesions.  Renal failure.  EXAM: CT CHEST, ABDOMEN, AND PELVIS WITH CONTRAST  TECHNIQUE: Multidetector CT imaging of the chest, abdomen and pelvis was performed following the standard protocol during bolus administration of intravenous contrast.  CONTRAST:  80mL OMNIPAQUE IOHEXOL 300 MG/ML  SOLN  COMPARISON:  07/11/2005  FINDINGS: CT CHEST FINDINGS  Hyperemic 2.3 cm right paratracheal lymph node. Smaller enlarged precarinal, subcarinal, pretracheal, and prevascular lymph nodes. Trace left small and right pleural effusions. There is a permeative lesion in the proximal right 9th rib with surrounding soft tissue and associated pathologic fracture. Smaller lytic lesion with path fracture through lateral aspect right 6th rib. Old healed left rib fractures. Small subpleural blebs in both upper lobes. There is dependent atelectasis in both lower lobes, right greater than left, with no discrete mass identified.  CT ABDOMEN AND PELVIS FINDINGS  Surgical clips in the gallbladder fossa.  Unremarkable liver, spleen, adrenal glands. Innumerable cysts throughout both kidneys. 4.5 cm enhancing mass in the interpolar region of the left kidney. 3.3 cm enhancing mass in the upper pole of the right kidney. Renal veins are patent. No retroperitoneal adenopathy. Atheromatous nondilated abdominal aorta. Stomach, small bowel and colon are nondilated. Expansile lytic enhancing masses involve the superior left pubic ramus and anterior acetabulum, L2 with pathologic fracture, L5 vertebral body, posterior right iliac bone, and intertrochanteric region of the right femur. Enlarged 14 mm right inguinal lymph node. Trace pelvic ascites. No free air.  IMPRESSION: 1. Bilateral enhancing renal masses suggesting either synchronous renal cell carcinoma or metastatic disease. 2. Lytic osseous metastatic disease involving right ribs, L2 with  pathologic fracture, L5, pelvis, and right femur. 3. Mediastinal adenopathy. 4. Pleural effusions, right greater than left. 5. Innumerable renal cysts suggesting polycystic kidney disease.   Electronically Signed   By: Oley Balm M.D.   On: 07/31/2013 22:25   Mr Hip Left Wo Contrast  07/31/2013   CLINICAL DATA:  Severe left hip pain. Abnormal radiographs dated 07/15/2013  EXAM: MRI OF THE LEFT HIP WITHOUT CONTRAST  TECHNIQUE: Multiplanar, multisequence MR imaging was performed. No intravenous contrast was administered.  COMPARISON:  Radiographs dated 07/15/2013  FINDINGS: There is a 6 x 6 x 5 cm inhomogeneous destructive soft tissue mass destroying the anterior medial and anterior superior aspects of the left acetabulum and the lateral aspect of the left superior pubic ramus. The tumor protrudes into the pelvis. Tumor extends into the left internal obturator muscle.  There is an expansile 2 cm mass in the posterior aspect of the L5 vertebral body slightly protruding into the spinal canal. There is a 2.5 cm destructive lesion in the posterior aspect of the left iliac bone adjacent to the superior aspect of the left sacroiliac joint. There is a 16 mm destructive lesion in the posterior lateral aspect of the proximal right femur just below the right greater trochanter. There is a 4 cm destructive lesion in the medullary canal of the proximal right femoral shaft.  The patient does have fairly severe arthritis of the superior aspect of the left femoral head with joint space narrowing and degenerative changes of the remaining portion of the left acetabulum. There small bilateral hip effusions. There is extensive edema in the adjacent soft tissues around the left hip. The edema extends into the left side of the pelvis.  2.7 cm cyst in the right groin. This is of unknown etiology but appears benign.  IMPRESSION: 1. Numerous destructive bone lesions, with the largest lesion destroying the left acetabulum, consistent  with metastatic disease. 2. 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 3. Critical Value/emergent results were called by telephone at the time of interpretation on 07/31/2013 at 1:15 PM to Dr.KELLIE [facility] , who verbally acknowledged these results.   Electronically Signed   By: Geanie Cooley M.D.   On: 07/31/2013 13:25   Dg Chest Portable 1 View  07/31/2013   CLINICAL DATA:  The shortness breath. DVT.  EXAM: PORTABLE CHEST - 1 VIEW  COMPARISON:  07/15/2013 and 12/02/2009  FINDINGS: Right base wedge-shaped consolidation. This may represent atelectasis. Pulmonary infarct or infiltrate not excluded. Recommend followup until clearance.  Elevated right hemidiaphragm. This limits evaluation of the right lung base.  Pulmonary vascular prominence most notable centrally.  Cardiomegaly.  Tortuous aorta.  The previously questioned nodule adjacent to the left hemidiaphragm is not delineated on the present exam. Recommend followup two view chest when  able with attention to this region.  IMPRESSION: Right base wedge-shaped consolidation. This may represent atelectasis. Pulmonary infarct or infiltrate not excluded.  Pulmonary vascular prominence most notable centrally.  Cardiomegaly.  Tortuous aorta.  The previously questioned nodule adjacent to the left hemidiaphragm is not delineated on the present exam. Recommend followup two view chest when able with attention to this region.   Electronically Signed   By: Bridgett Larsson M.D.   On: 07/31/2013 17:52   Medications:   . amLODipine  10 mg Oral QHS  . calcium carbonate  3 tablet Oral Daily  . darbepoetin (ARANESP) injection - DIALYSIS  25 mcg Intravenous Q Thu-HD  . doxepin  25 mg Oral QHS  . ferric gluconate (FERRLECIT/NULECIT) IV  62.5 mg Intravenous Q Thu-HD  . heparin  5,000 Units Subcutaneous Q8H  . losartan  25 mg Oral QHS  . [START ON 08/02/2013] pneumococcal 23 valent vaccine  0.5 mL Intramuscular Tomorrow-1000

## 2013-08-01 NOTE — H&P (Signed)
Referring Physician: Dr. Blake Divine HPI: [name] is an 62 y.o. male with PMHx of recent falls and c/o left hip pain with xrays on 07/15/13 which revealed suspected lytic lesion. Patient c/o worsening left hip pain and presented 10/29 and had MR of his hip revealing numerous destructive bone lesions. CT chest abdomen and pelvis were done revealing renal masses bilaterally and other areas of concern. Request for biopsy for diagnosis. Patient with right iliac bone lesion amendable to image guided biopsy. He states he has had lower back pain centrally and peripherally x 2-3 weeks, he c/o right hip x 1 year after a fall. His most recent complaint is left hip pain after a fall and lower back pain. He denies any blood in his stool or urine. He is a tobacco user 1 ppd x 18 years. He denies any weight loss, decrease in appetite, fever, chills, or night sweats. He does admit to a white sputum cough which is new x 2-3 weeks. He denies hemoptysis. He denies any chest pain and denies any change in his chronic shortness of breath. The patient does have history of hepatitis C, ESRD and on HD.   Past Medical History:  Past Medical History  Diagnosis Date  . Renal disorder   . Hypertension   . Dialysis patient   . Secondary hyperparathyroidism (of renal origin)     s/p parathyroidectomy  . Anemia due to chronic disease treated with erythropoietin     Past Surgical History:  Past Surgical History  Procedure Laterality Date  . Av fistula placement      Family History:  Family History  Problem Relation Age of Onset  . Lung cancer      Social History:  reports that he has been smoking Cigarettes.  He has a 15 pack-year smoking history. He uses smokeless tobacco. He reports that he does not drink alcohol. His drug history is not on file.  Allergies: No Known Allergies    Medication List    ASK your doctor about these medications       amLODipine 10 MG tablet  Commonly known as:  NORVASC  Take 10 mg  by mouth at bedtime.     calcium carbonate 500 MG chewable tablet  Commonly known as:  TUMS - dosed in mg elemental calcium  Chew 3 tablets by mouth daily.     doxepin 25 MG capsule  Commonly known as:  SINEQUAN  Take 25 mg by mouth at bedtime.     losartan 25 MG tablet  Commonly known as:  COZAAR  Take 25 mg by mouth at bedtime.     traMADol 50 MG tablet  Commonly known as:  ULTRAM  Take 50 mg by mouth every 6 (six) hours as needed for pain (pain).       Please HPI for pertinent positives, otherwise complete 10 system ROS negative.  Physical Exam: BP 120/68  Pulse 65  Temp(Src) 98.5 F (36.9 C) (Oral)  Resp 18  Ht 5\' 7"  (1.702 m)  Wt 128 lb 15.5 oz (58.5 kg)  BMI 20.19 kg/m2  SpO2 90% Body mass index is 20.19 kg/(m^2).  General Appearance:  Alert, cooperative, no distress  Head:  Normocephalic, without obvious abnormality, atraumatic  Neck: Supple, symmetrical, trachea midline  Lungs:   Clear to auscultation bilaterally, diminished in bases no w/r/r, respirations unlabored without use of accessory muscles.  Chest Wall:  No tenderness or deformity  Heart:  Regular rate and rhythm, S1, S2 normal, no murmur, rub  or gallop.  Abdomen:   Soft, non-tender, non distended, (+) BS  Extremities: RUE AVF with swelling, atraumatic, no cyanosis  Pulses: 1+ and symmetric  Neurologic: Normal affect, no gross deficits.   Results for orders placed during the hospital encounter of 07/31/13 (from the past 48 hour(s))  PSA     Status: None   Collection Time    07/31/13  5:25 PM      Result Value Range   PSA 1.32  <=4.00 ng/mL   Comment: (NOTE)     Test Methodology: ECLIA PSA (Electrochemiluminescence Immunoassay)     For PSA values from 2.5-4.0, particularly in younger men <60 years     old, the AUA and NCCN suggest testing for % Free PSA (3515) and     evaluation of the rate of increase in PSA (PSA velocity).     Performed at [facility]  CBC WITH DIFFERENTIAL      Status: Abnormal   Collection Time    07/31/13  5:25 PM      Result Value Range   WBC 9.6  4.0 - 10.5 K/uL   RBC 3.86 (*) 4.22 - 5.81 MIL/uL   Hemoglobin 10.1 (*) 13.0 - 17.0 g/dL   HCT 28.4 (*) 13.2 - 44.0 %   MCV 75.6 (*) 78.0 - 100.0 fL   MCH 26.2  26.0 - 34.0 pg   MCHC 34.6  30.0 - 36.0 g/dL   RDW 10.2 (*) 72.5 - 36.6 %   Platelets 306  150 - 400 K/uL   Neutrophils Relative % 63  43 - 77 %   Neutro Abs 6.1  1.7 - 7.7 K/uL   Lymphocytes Relative 22  12 - 46 %   Lymphs Abs 2.1  0.7 - 4.0 K/uL   Monocytes Relative 14 (*) 3 - 12 %   Monocytes Absolute 1.3 (*) 0.1 - 1.0 K/uL   Eosinophils Relative 2  0 - 5 %   Eosinophils Absolute 0.1  0.0 - 0.7 K/uL   Basophils Relative 0  0 - 1 %   Basophils Absolute 0.0  0.0 - 0.1 K/uL  COMPREHENSIVE METABOLIC PANEL     Status: Abnormal   Collection Time    07/31/13  5:25 PM      Result Value Range   Sodium 140  135 - 145 mEq/L   Potassium 4.8  3.5 - 5.1 mEq/L   Chloride 97  96 - 112 mEq/L   CO2 28  19 - 32 mEq/L   Glucose, Bld 85  70 - 99 mg/dL   BUN 42 (*) 6 - 23 mg/dL   Creatinine, Ser 4.40 (*) 0.50 - 1.35 mg/dL   Calcium 34.7 (*) 8.4 - 10.5 mg/dL   Total Protein 8.1  6.0 - 8.3 g/dL   Albumin 3.3 (*) 3.5 - 5.2 g/dL   AST 14  0 - 37 U/L   ALT 13  0 - 53 U/L   Alkaline Phosphatase 88  39 - 117 U/L   Total Bilirubin 0.5  0.3 - 1.2 mg/dL   GFR calc non Af Amer 8 (*) >90 mL/min   GFR calc Af Amer 10 (*) >90 mL/min   Comment: (NOTE)     The eGFR has been calculated using the CKD EPI equation.     This calculation has not been validated in all clinical situations.     eGFR's persistently <90 mL/min signify possible Chronic Kidney     Disease.  CBC  Status: Abnormal   Collection Time    08/01/13  9:50 AM      Result Value Range   WBC 7.8  4.0 - 10.5 K/uL   RBC 3.80 (*) 4.22 - 5.81 MIL/uL   Hemoglobin 10.0 (*) 13.0 - 17.0 g/dL   HCT 13.2 (*) 44.0 - 10.2 %   MCV 74.2 (*) 78.0 - 100.0 fL   MCH 26.3  26.0 - 34.0 pg   MCHC 35.5   30.0 - 36.0 g/dL   RDW 72.5 (*) 36.6 - 44.0 %   Platelets 337  150 - 400 K/uL  RENAL FUNCTION PANEL     Status: Abnormal   Collection Time    08/01/13  9:51 AM      Result Value Range   Sodium 139  135 - 145 mEq/L   Potassium 4.5  3.5 - 5.1 mEq/L   Chloride 97  96 - 112 mEq/L   CO2 27  19 - 32 mEq/L   Glucose, Bld 145 (*) 70 - 99 mg/dL   BUN 51 (*) 6 - 23 mg/dL   Creatinine, Ser 3.47 (*) 0.50 - 1.35 mg/dL   Calcium 42.5  8.4 - 95.6 mg/dL   Phosphorus 3.7  2.3 - 4.6 mg/dL   Albumin 3.0 (*) 3.5 - 5.2 g/dL   GFR calc non Af Amer 7 (*) >90 mL/min   GFR calc Af Amer 8 (*) >90 mL/min   Comment: (NOTE)     The eGFR has been calculated using the CKD EPI equation.     This calculation has not been validated in all clinical situations.     eGFR's persistently <90 mL/min signify possible Chronic Kidney     Disease.   Dg Lumbar Spine 2-3 Views  07/31/2013   CLINICAL DATA:  Low back pain with recent metastatic diagnosis  EXAM: LUMBAR SPINE - 2-3 VIEW  COMPARISON:  Contemporaneously body CT.  FINDINGS: Extensive metastatic disease of the lumbar spine is under appreciated by radiography. There is a lytic lesion in the posterior L5 body and an extensive permeative lesion in the L2 body. There is a superior endplate fracture of L2 noted. Patchy sclerosis, present in the lower L2, upper L3, lower L4, and upper L5 bodies that is most likely degenerative in nature.  IMPRESSION: 1. Metastatic disease in the L2 and L5 bodies. 2. Pathologic L2 superior endplate fracture with mild compression.   Electronically Signed   By: Tiburcio Pea M.D.   On: 07/31/2013 22:23   Ct Chest W Contrast  07/31/2013   CLINICAL DATA:  Bone lesions.  Renal failure.  EXAM: CT CHEST, ABDOMEN, AND PELVIS WITH CONTRAST  TECHNIQUE: Multidetector CT imaging of the chest, abdomen and pelvis was performed following the standard protocol during bolus administration of intravenous contrast.  CONTRAST:  80mL OMNIPAQUE IOHEXOL 300 MG/ML   SOLN  COMPARISON:  07/11/2005  FINDINGS: CT CHEST FINDINGS  Hyperemic 2.3 cm right paratracheal lymph node. Smaller enlarged precarinal, subcarinal, pretracheal, and prevascular lymph nodes. Trace left small and right pleural effusions. There is a permeative lesion in the proximal right 9th rib with surrounding soft tissue and associated pathologic fracture. Smaller lytic lesion with path fracture through lateral aspect right 6th rib. Old healed left rib fractures. Small subpleural blebs in both upper lobes. There is dependent atelectasis in both lower lobes, right greater than left, with no discrete mass identified.  CT ABDOMEN AND PELVIS FINDINGS  Surgical clips in the gallbladder fossa. Unremarkable liver, spleen, adrenal glands.  Innumerable cysts throughout both kidneys. 4.5 cm enhancing mass in the interpolar region of the left kidney. 3.3 cm enhancing mass in the upper pole of the right kidney. Renal veins are patent. No retroperitoneal adenopathy. Atheromatous nondilated abdominal aorta. Stomach, small bowel and colon are nondilated. Expansile lytic enhancing masses involve the superior left pubic ramus and anterior acetabulum, L2 with pathologic fracture, L5 vertebral body, posterior right iliac bone, and intertrochanteric region of the right femur. Enlarged 14 mm right inguinal lymph node. Trace pelvic ascites. No free air.  IMPRESSION: 1. Bilateral enhancing renal masses suggesting either synchronous renal cell carcinoma or metastatic disease. 2. 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 3. Mediastinal adenopathy. 4. Pleural effusions, right greater than left. 5. Innumerable renal cysts suggesting polycystic kidney disease.   Electronically Signed   By: Oley Balm M.D.   On: 07/31/2013 22:25   Ct Abdomen Pelvis W Contrast  07/31/2013   CLINICAL DATA:  Bone lesions.  Renal failure.  EXAM: CT CHEST, ABDOMEN, AND PELVIS WITH  CONTRAST  TECHNIQUE: Multidetector CT imaging of the chest, abdomen and pelvis was performed following the standard protocol during bolus administration of intravenous contrast.  CONTRAST:  80mL OMNIPAQUE IOHEXOL 300 MG/ML  SOLN  COMPARISON:  07/11/2005  FINDINGS: CT CHEST FINDINGS  Hyperemic 2.3 cm right paratracheal lymph node. Smaller enlarged precarinal, subcarinal, pretracheal, and prevascular lymph nodes. Trace left small and right pleural effusions. There is a permeative lesion in the proximal right 9th rib with surrounding soft tissue and associated pathologic fracture. Smaller lytic lesion with path fracture through lateral aspect right 6th rib. Old healed left rib fractures. Small subpleural blebs in both upper lobes. There is dependent atelectasis in both lower lobes, right greater than left, with no discrete mass identified.  CT ABDOMEN AND PELVIS FINDINGS  Surgical clips in the gallbladder fossa. Unremarkable liver, spleen, adrenal glands. Innumerable cysts throughout both kidneys. 4.5 cm enhancing mass in the interpolar region of the left kidney. 3.3 cm enhancing mass in the upper pole of the right kidney. Renal veins are patent. No retroperitoneal adenopathy. Atheromatous nondilated abdominal aorta. Stomach, small bowel and colon are nondilated. Expansile lytic enhancing masses involve the superior left pubic ramus and anterior acetabulum, L2 with pathologic fracture, L5 vertebral body, posterior right iliac bone, and intertrochanteric region of the right femur. Enlarged 14 mm right inguinal lymph node. Trace pelvic ascites. No free air.  IMPRESSION: 1. Bilateral enhancing renal masses suggesting either synchronous renal cell carcinoma or metastatic disease. 2. 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 3. Mediastinal adenopathy. 4. Pleural effusions, right greater than left. 5. Innumerable renal cysts suggesting polycystic kidney  disease.   Electronically Signed   By: Oley Balm M.D.   On: 07/31/2013 22:25   Mr Hip Left Wo Contrast  07/31/2013   CLINICAL DATA:  Severe left hip pain. Abnormal radiographs dated 07/15/2013  EXAM: MRI OF THE LEFT HIP WITHOUT CONTRAST  TECHNIQUE: Multiplanar, multisequence MR imaging was performed. No intravenous contrast was administered.  COMPARISON:  Radiographs dated 07/15/2013  FINDINGS: There is a 6 x 6 x 5 cm inhomogeneous destructive soft tissue mass destroying the anterior medial and anterior superior aspects of the left acetabulum and the lateral aspect of the left superior pubic ramus. The tumor protrudes into the pelvis. Tumor extends into the left internal obturator muscle.  There is an expansile 2 cm mass  in the posterior aspect of the L5 vertebral body slightly protruding into the spinal canal. There is a 2.5 cm destructive lesion in the posterior aspect of the left iliac bone adjacent to the superior aspect of the left sacroiliac joint. There is a 16 mm destructive lesion in the posterior lateral aspect of the proximal right femur just below the right greater trochanter. There is a 4 cm destructive lesion in the medullary canal of the proximal right femoral shaft.  The patient does have fairly severe arthritis of the superior aspect of the left femoral head with joint space narrowing and degenerative changes of the remaining portion of the left acetabulum. There small bilateral hip effusions. There is extensive edema in the adjacent soft tissues around the left hip. The edema extends into the left side of the pelvis.  2.7 cm cyst in the right groin. This is of unknown etiology but appears benign.  IMPRESSION: 1. Numerous destructive bone lesions, with the largest lesion destroying the left acetabulum, consistent with metastatic disease. 2. 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 3. Critical Value/emergent results were called by telephone at the time of interpretation on 07/31/2013 at  1:15 PM to Dr.KELLIE [facility] , who verbally acknowledged these results.   Electronically Signed   By: Geanie Cooley M.D.   On: 07/31/2013 13:25   Dg Chest Portable 1 View  07/31/2013   CLINICAL DATA:  The shortness breath. DVT.  EXAM: PORTABLE CHEST - 1 VIEW  COMPARISON:  07/15/2013 and 12/02/2009  FINDINGS: Right base wedge-shaped consolidation. This may represent atelectasis. Pulmonary infarct or infiltrate not excluded. Recommend followup until clearance.  Elevated right hemidiaphragm. This limits evaluation of the right lung base.  Pulmonary vascular prominence most notable centrally.  Cardiomegaly.  Tortuous aorta.  The previously questioned nodule adjacent to the left hemidiaphragm is not delineated on the present exam. Recommend followup two view chest when able with attention to this region.  IMPRESSION: Right base wedge-shaped consolidation. This may represent atelectasis. Pulmonary infarct or infiltrate not excluded.  Pulmonary vascular prominence most notable centrally.  Cardiomegaly.  Tortuous aorta.  The previously questioned nodule adjacent to the left hemidiaphragm is not delineated on the present exam. Recommend followup two view chest when able with attention to this region.   Electronically Signed   By: Bridgett Larsson M.D.   On: 07/31/2013 17:52    Assessment/Plan Question metastatic disease with unknown primary. CT revealed renal masses bilaterally, lytic osseous metastatic disease with right iliac bone lesion and multiple areas of concern. Request for image guided biopsy for diagnosis. ESRD on HD with right forearm AVF Hepatitis C. Anemia. Patient will be NPO, sq heparin held 10/31, INR drawn. Images and all other labs reviewed. Scheduled for image guided right iliac bone lesion biopsy on 10/31 Risks and Benefits discussed with the patient. All of the patient's questions were answered, patient is agreeable to proceed. Consent signed and in CT.  Pattricia Boss D  PA-C 08/01/2013, 3:48 PM

## 2013-08-01 NOTE — Consult Note (Signed)
[facility]  [phone]     ONCOLOGY  HOSPITAL CONSULTATION NOTE  [name]                                MR#: [id number]  DOB: [birth year]                           CSN#: [id number]  Referring MD: Dr. Emmie Niemann Hospitalists, Dr.Akula [phone]) Primary MD: Dr.Mattingly  Reason for Consult: Metastatic cancer of unknown primary   QMV:HQIONG [name] is a 62 y.o. African American  male smoker  with multiple medical problems including ESRD on HD, asked to see for evaluation metastatic bone lesions of unknown primary.    In review, the patient was admitted on 07/31/2013 after he had  presented with worsening left  hip and right rib pain  Requiring outpatient workup.  Of note, he had been seen by PCP/Orthopedics  2-3 weeks prior, at which time a chest x-ray and a hip x-ray demonstrated acute or subacute right rib fracture, and a suspected lucent/lytic expansile lesion of the lateral portion of the left superior pubic ramus with underlying pathologic fractures potentially extending into the quadrilateral plate of the acetabulum. Sclerotic lesion in the left inferior pubic ramus suspicious for metastatic lesion, potentially with internal stress fracture. A questionable nodule adjacent to the left hemidiaphragm seen on the prior x-ray was not be stable on admission. MRI of the left hip without contrast on 07/31/2013 revealed a 6 x 6 x 5 cm inhomogeneous destructive soft tissue mass destroying the anterior medial and anterior superior aspects of the left acetabulum and the lateral aspect of the left superior pubic ramus. The tumor protrudes into the pelvis, extending  into the left internal obturator muscle. There is an expansile 2 cm mass in the posterior aspect of the L5 vertebral body slightly protruding into the spinal canal. A 2.5 cm destructive lesion in the posterior aspect of the left iliac bone adjacent to the superior aspect of the left sacroiliac joint, a 16 mm  destructive lesion in the posterior lateral aspect of the proximal right femur just below the right greater trochanter and a  4 cm destructive lesion in the medullary canal of the proximal right femoral shaft are noted. Small bilateral hip effusions and extensive edema in the adjacent soft tissues around the left hip extending  into the left side of the pelvis are seen.  CT of the chest abdomen and pelvis with contrast on 07/31/2013 demonstrated a  hyperemic 2.3 cm right paratracheal lymph node, smaller enlarged precarinal, subcarinal, pretracheal, and prevascular lymph nodes, trace left small and right pleural effusions, a permeative lesion in the proximal right 9th rib with surrounding soft tissue and associated pathologic fracture. A Smaller lytic lesion with path fracture through lateral aspect right 6th rib along with Old healed left rib fractures are seen. Small subpleural blebs in both upper lobes are noted. Liver, spleen, adrenal glands are unremarkable. Innumerable cysts throughout both kidneys are seen. A 4.5 cm enhancing mass in the interpolar region of the left kidney and a  3.3 cm enhancing mass in the upper pole of the right kidney are noted. Renal veins are patent. No retroperitoneal adenopathy. Stomach, small bowel and colon are nondilated. Expansile lytic enhancing masses as above. Enlarged 14 mm right inguinal lymph node was observed. Trace pelvic ascites. No free air. PSA was 1.32,  normal. No other tumor markers available. Radiation Oncology to see, as Orthopedic consult concludes patient is not an operative candidate. Palliative care consultation is pending.   We were requested to see this patient with recommendations.        PMH:  Diagnosis   ESRD on HD since 12 /1998  . Hypertension  . H/O Pulmonary edema corrected with hemodialysis 2003   . Secondary hyperparathyroidism (of renal origin)     . Anemia due to chronic disease treated with erythropoietin         Hepatitis C positive  with high quantitative RNA, at least dating back to 2003. Not on treatment.         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 Av fistula placement    S/P parathyroidectomy 02/2004 with autotransplantation  S/p Laparoscopic cholecystectomy with interoperative cholangiogram 07/2004.   Allergies: No Known Allergies  Medications:   . amLODipine  10 mg Oral QHS  . calcium carbonate  3 tablet Oral Daily  . darbepoetin (ARANESP) injection - DIALYSIS  25 mcg Intravenous Q Thu-HD  . doxepin  25 mg Oral QHS  . ferric gluconate (FERRLECIT/NULECIT) IV  62.5 mg Intravenous Q Thu-HD  . heparin  5,000 Units Subcutaneous Q8H  . losartan  25 mg Oral QHS  . [START ON 08/02/2013] pneumococcal 23 valent vaccine  0.5 mL Intramuscular Tomorrow-1000   ZOX:[id number], acetaminophen, albuterol, HYDROcodone-acetaminophen, HYDROmorphone (DILAUDID) injection, ondansetron (ZOFRAN) IV, ondansetron  ROS: Constitutional: Negative for weight loss. Negative for fever, chills or  night sweats. Fatigued. Eyes: Negative for blurred vision and double vision.  Respiratory: Chronic cough. No hemoptysis.No hoarseness.No neck swelling. Chronic  shortness of breath on exertion. No pleuritic chest pain.  Cardiovascular: Negative for chest pain. No palpitations.  GI: Negative for  nausea, vomiting, diarrhea or constipation. No change in bowel caliber. No  Melena or hematochezia. No abdominal pain. No appetite changes GU: Patient is on HD. Small amounts of urine Skin: Negative for itching. No rash. No petechia. No easy  Bruising. Musculoskeletal:as above. He is left hip and lumbar pain was worsening over the last 3 weeks, worse with weight bearing. He sustained a fall about 4 weeks ago.  Neurological: No headaches.No confusion. No motor or  sensory deficits.  Family History:    Father died with cirrhosis of the liver. Mother died of old age. 2 siblings with CAD  Social History:  1 pack/day for 30 years (30 pk yrs) He uses smokeless tobacco. No ETOH since 2006 (40 ounce beer every day or every other day). He does admit to using crack cocaine until 2006, however, he denies any recent cocaine use.  Single.No children.Lives with his sister Brigitte Pulse, [phone] in [place]. Full Code. Medicare.  Physical Exam    Filed Vitals:   08/01/13 0938  BP: 142/88  Pulse: 86  Temp: 98.1 F (36.7 C)  Resp: 18     Filed Weights   07/31/13 2105 08/01/13 0938  Weight: 134 lb 1.6 oz (60.827 kg) 132 lb 7.9 oz (60.1 kg)   General:   62 year old  in no acute distress A. and O. x3  Well-developed,thin. Appears uncomfortable. HEENT: Normocephalic, atraumatic, PERRLA. Sclerae anicteric but muddy. Oral cavity without thrush or lesions.Several missing  teeth.  NECK:supple. no thyromegaly, no cervical or supraclavicular adenopathy  LUNGS:bibasilar rales at the bases. . No wheezing, rhonchi or rales. No axillary masses. BREASTS: not examined. CARDIOVASCULAR: regular rate and rhythm,1/6 systolic murmur , rubs or gallops ABDOMEN: soft nontender , bowel sounds x4. No HSM. No masses palpable.  GU/rectal: deferred. EXTREMITIES: no clubbing cyanosis or edema. No bruising or petechial rash. R AVF. some tense edema noted when compared to LUE. MUSCULOSKELETAL: Left hip tenderness with movement, non tender to palpation.  NEURO: Non Focal. No Horner's.   Labs:  CBC   Recent Labs Lab 07/31/13 1725  WBC 9.6  HGB 10.1*  HCT 29.2*  PLT 306  MCV 75.6*  MCH 26.2  MCHC 34.6  RDW 16.9*  LYMPHSABS 2.1  MONOABS 1.3*  EOSABS 0.1  BASOSABS 0.0     CMP    Recent Labs Lab 07/31/13 1725  NA 140  K 4.8  CL 97  CO2 28  GLUCOSE 85  BUN 42*  CREATININE 6.50*  CALCIUM 10.6*  AST 14  ALT 13  ALKPHOS 88  BILITOT 0.5    Imaging  Studies:  Dg Chest 2 View  07/15/2013   CLINICAL DATA:  Cough. Chest pain. Fall.  Left hip pain.  EXAM: CHEST  2 VIEW  COMPARISON:  12/24/2010  FINDINGS: Mildly enlarged cardiopericardial silhouette noted with prominence of upper zone pulmonary vasculature. There is abnormal blunting of the right costophrenic angle with adjacent airspace opacity in the right lung base. Nonspecific focal airspace opacity along the left hemidiaphragm is observed.  There is an acute or subacute right 6th rib fracture post for laterally.  IMPRESSION: 1. Small right pleural effusion with adjacent with suspected atelectasis. 2. Atelectasis or pulmonary nodule along the left hemidiaphragm. Followup chest radiography to ensure clearance, or chest CT, is recommended. 3. Mild cardiomegaly with pulmonary venous hypertension but without overt edema. 4. Acute or subacute right posterolateral 6th rib fracture.   Electronically Signed   By: Herbie Baltimore M.D.   On: 07/15/2013 13:40   Dg Lumbar Spine 2-3 Views  07/31/2013   CLINICAL DATA:  Low back pain with recent metastatic diagnosis  EXAM: LUMBAR SPINE - 2-3 VIEW  COMPARISON:  Contemporaneously body CT.  FINDINGS: Extensive metastatic disease of the lumbar spine is under appreciated by radiography. There is a lytic lesion in the posterior L5 body and an extensive permeative lesion in the L2 body. There is a superior endplate fracture of L2 noted. Patchy sclerosis, present in the lower L2, upper L3, lower L4, and upper L5 bodies that is most likely degenerative in nature.  IMPRESSION: 1. Metastatic disease in the L2 and L5 bodies. 2. Pathologic L2 superior endplate fracture with mild compression.   Electronically Signed   By: Tiburcio Pea M.D.   On: 07/31/2013 22:23   Dg Hip Complete Left  07/15/2013   CLINICAL DATA:  Fall. Left hip pain.  EXAM: LEFT HIP - COMPLETE 2+ VIEW  COMPARISON:  None.  FINDINGS: There is abnormal lucency in the left superior pubic ramus laterally,  extending towards the acetabulum, with suspected fractures through this lucent region of bone. There is also an abnormal sclerotic lesion in the left inferior pubic ramus with some associated subtle linear lucency internally.  Calcification in the vicinity of the acetabular labrum may be from labral degeneration or a chronically fragmented spurring.  IMPRESSION: 1. Suspected lucent/lytic expansile lesion of the lateral portion of the left superior pubic ramus with underlying pathologic fractures potentially extending  into the quadrilateral plate of the acetabulum. Sclerotic lesion in the left inferior pubic ramus suspicious for metastatic lesion, potentially with internal stress fracture. Correlate with patient history of malignancy. Consider whole-body bone scan bone scan and/ or MRI for further characterization and to assess the rest of the skeleton. 2. Fragmented acetabular spurring versus labral degeneration.   Electronically Signed   By: Herbie Baltimore M.D.   On: 07/15/2013 13:46   Ct Chest W Contrast  07/31/2013   CLINICAL DATA:  Bone lesions.  Renal failure.  EXAM: CT CHEST, ABDOMEN, AND PELVIS WITH CONTRAST  TECHNIQUE: Multidetector CT imaging of the chest, abdomen and pelvis was performed following the standard protocol during bolus administration of intravenous contrast.  CONTRAST:  80mL OMNIPAQUE IOHEXOL 300 MG/ML  SOLN  COMPARISON:  07/11/2005  FINDINGS: CT CHEST FINDINGS  Hyperemic 2.3 cm right paratracheal lymph node. Smaller enlarged precarinal, subcarinal, pretracheal, and prevascular lymph nodes. Trace left small and right pleural effusions. There is a permeative lesion in the proximal right 9th rib with surrounding soft tissue and associated pathologic fracture. Smaller lytic lesion with path fracture through lateral aspect right 6th rib. Old healed left rib fractures. Small subpleural blebs in both upper lobes. There is dependent atelectasis in both lower lobes, right greater than left, with  no discrete mass identified.  CT ABDOMEN AND PELVIS FINDINGS  Surgical clips in the gallbladder fossa. Unremarkable liver, spleen, adrenal glands. Innumerable cysts throughout both kidneys. 4.5 cm enhancing mass in the interpolar region of the left kidney. 3.3 cm enhancing mass in the upper pole of the right kidney. Renal veins are patent. No retroperitoneal adenopathy. Atheromatous nondilated abdominal aorta. Stomach, small bowel and colon are nondilated. Expansile lytic enhancing masses involve the superior left pubic ramus and anterior acetabulum, L2 with pathologic fracture, L5 vertebral body, posterior right iliac bone, and intertrochanteric region of the right femur. Enlarged 14 mm right inguinal lymph node. Trace pelvic ascites. No free air.  IMPRESSION: 1. Bilateral enhancing renal masses suggesting either synchronous renal cell carcinoma or metastatic disease. 2. 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 3. Mediastinal adenopathy. 4. Pleural effusions, right greater than left. 5. Innumerable renal cysts suggesting polycystic kidney disease.   Electronically Signed   By: Oley Balm M.D.   On: 07/31/2013 22:25   Ct Abdomen Pelvis W Contrast  07/31/2013   CLINICAL DATA:  Bone lesions.  Renal failure.  EXAM: CT CHEST, ABDOMEN, AND PELVIS WITH CONTRAST  TECHNIQUE: Multidetector CT imaging of the chest, abdomen and pelvis was performed following the standard protocol during bolus administration of intravenous contrast.  CONTRAST:  80mL OMNIPAQUE IOHEXOL 300 MG/ML  SOLN  COMPARISON:  07/11/2005  FINDINGS: CT CHEST FINDINGS  Hyperemic 2.3 cm right paratracheal lymph node. Smaller enlarged precarinal, subcarinal, pretracheal, and prevascular lymph nodes. Trace left small and right pleural effusions. There is a permeative lesion in the proximal right 9th rib with surrounding soft tissue and associated pathologic fracture. Smaller lytic  lesion with path fracture through lateral aspect right 6th rib. Old healed left rib fractures. Small subpleural blebs in both upper lobes. There is dependent atelectasis in both lower lobes, right greater than left, with no discrete mass identified.  CT ABDOMEN AND PELVIS FINDINGS  Surgical clips in the gallbladder fossa. Unremarkable liver, spleen, adrenal glands. Innumerable cysts throughout both kidneys. 4.5 cm enhancing mass in the interpolar region of the left kidney. 3.3 cm enhancing mass in the upper  pole of the right kidney. Renal veins are patent. No retroperitoneal adenopathy. Atheromatous nondilated abdominal aorta. Stomach, small bowel and colon are nondilated. Expansile lytic enhancing masses involve the superior left pubic ramus and anterior acetabulum, L2 with pathologic fracture, L5 vertebral body, posterior right iliac bone, and intertrochanteric region of the right femur. Enlarged 14 mm right inguinal lymph node. Trace pelvic ascites. No free air.  IMPRESSION: 1. Bilateral enhancing renal masses suggesting either synchronous renal cell carcinoma or metastatic disease. 2. 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 3. Mediastinal adenopathy. 4. Pleural effusions, right greater than left. 5. Innumerable renal cysts suggesting polycystic kidney disease.   Electronically Signed   By: Oley Balm M.D.   On: 07/31/2013 22:25   Mr Hip Left Wo Contrast  07/31/2013   CLINICAL DATA:  Severe left hip pain. Abnormal radiographs dated 07/15/2013  EXAM: MRI OF THE LEFT HIP WITHOUT CONTRAST  TECHNIQUE: Multiplanar, multisequence MR imaging was performed. No intravenous contrast was administered.  COMPARISON:  Radiographs dated 07/15/2013  FINDINGS: There is a 6 x 6 x 5 cm inhomogeneous destructive soft tissue mass destroying the anterior medial and anterior superior aspects of the left acetabulum and the lateral aspect of the left  superior pubic ramus. The tumor protrudes into the pelvis. Tumor extends into the left internal obturator muscle.  There is an expansile 2 cm mass in the posterior aspect of the L5 vertebral body slightly protruding into the spinal canal. There is a 2.5 cm destructive lesion in the posterior aspect of the left iliac bone adjacent to the superior aspect of the left sacroiliac joint. There is a 16 mm destructive lesion in the posterior lateral aspect of the proximal right femur just below the right greater trochanter. There is a 4 cm destructive lesion in the medullary canal of the proximal right femoral shaft.  The patient does have fairly severe arthritis of the superior aspect of the left femoral head with joint space narrowing and degenerative changes of the remaining portion of the left acetabulum. There small bilateral hip effusions. There is extensive edema in the adjacent soft tissues around the left hip. The edema extends into the left side of the pelvis.  2.7 cm cyst in the right groin. This is of unknown etiology but appears benign.  IMPRESSION: 1. Numerous destructive bone lesions, with the largest lesion destroying the left acetabulum, consistent with metastatic disease. 2. 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 3. Critical Value/emergent results were called by telephone at the time of interpretation on 07/31/2013 at 1:15 PM to Dr.KELLIE [facility] , who verbally acknowledged these results.   Electronically Signed   By: Geanie Cooley M.D.   On: 07/31/2013 13:25   Dg Chest Portable 1 View  07/31/2013   CLINICAL DATA:  The shortness breath. DVT.  EXAM: PORTABLE CHEST - 1 VIEW  COMPARISON:  07/15/2013 and 12/02/2009  FINDINGS: Right base wedge-shaped consolidation. This may represent atelectasis. Pulmonary infarct or infiltrate not excluded. Recommend followup until clearance.  Elevated right hemidiaphragm. This limits evaluation of the right lung base.  Pulmonary vascular prominence most notable  centrally.  Cardiomegaly.  Tortuous aorta.  The previously questioned nodule adjacent to the left hemidiaphragm is not delineated on the present exam. Recommend followup two view chest when able with attention to this region.  IMPRESSION: Right base wedge-shaped consolidation. This may represent atelectasis. Pulmonary infarct or infiltrate not excluded.  Pulmonary vascular prominence most notable centrally.  Cardiomegaly.  Tortuous aorta.  The previously questioned nodule adjacent to the left hemidiaphragm is not delineated on the present exam. Recommend followup two view chest when able with attention to this region.   Electronically Signed   By: Bridgett Larsson M.D.   On: 07/31/2013 17:52      A/P: 62 y.o. male smoker with multiple medical issues including Hep C and ESRD on HD, Asked to see in consultation after he was found to have numerous destructive bone lesions, with the largest lesion destroying the left acetabulum, consistent with metastatic disease,along with lung nodules with lymphadenopathy and a possible R inguinal lymph node when he was being evaluated for bone pain with minimal weight bearing. No tissue diagnosis is available for review. IR yo see. Consider most external lesion for diagnosis, such as the inguinal node.  Radiation Oncology to see  for patient's lesion are not amenable to orthopedic repair, for fracture prevention and pain control.May need Palliative Care input.  Dr.Gladis Soley is to see the patient following this consult with recommendations regarding further workup studies. An addendum to this note is to be written.  Thank you for the referral.  Marcos Eke, PA-C 08/01/2013 9:52 AM   ADDENDUM:  Patient is a 62 year old male smoker with co-morbidities including Hep C, ESRDz on HD who presented with subacute left pain with outpatient imaging demonstrating destructive lytic lesions in the left acetabulum now admitted to the hospitalist for further evaluation and management.   Radiation oncology and intervention radiology have been consulted.  I agree with determining a tissue diagnosis; a CT-guided bone biopsy has been planned for 10/31. Differential includes MM (Risk factors, age greater than 15, AA male, lytic lesions, elevated calcium corrected for albumin) vs. metastatic kidney cancer (risk factors are longstanding hypertension, heavier smoker, complex kidney mass although not the typical finding on imaging) with 30-40 % presenting with bone metastases vs. Lung primary (longstanding smoker). Await SPEP with immunofixation.   Unlikely to be a candidate for bisphosphonate therapy given ESRDz and/or poor dentition.  I agree with radiation oncology input regarding palliative XRT to his left acetabulum.  We will await the results of his biopsy and follow with you. His pain is controlled presently.   I personally saw this patient and performed a substantive portion of this encounter with the listed APP documented above.   Korbyn Vanes, MD Medical Oncology

## 2013-08-01 NOTE — Progress Notes (Signed)
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  - Admitted to medical floor.  - CT abd and pelvis and chest revealed multiple renal masses and LAD.  - Check UPEP & SPEP to evaluate for MM.  - Oncology ocnsulted and recommendations given.   - Bedrest and nonweightbearing on left lower extremity.  - Orthopedic consulted by EDP-patient may eventually require stabilization surgery for left hip.  ESRD on dialysis  - Management per nephrology.  Hypertension  - Controlled.  Anemia  - Secondary to chronic kidney disease  - Management per nephrology.  Tobacco abuse  - Cessation counseled.   Code Status: full code Family Communication: none atbedside Disposition Plan: pending.    Consultants:  IR  ONCOLOGY  RADIATION ONCOLOGY  NEPHROLOGY  ORTHOPEDICS  Procedures:  Biopsy of the inguinal LAD  Antibiotics:  none  HPI/Subjective: Comfortable. Back pain an dhip pain 5/10   Objective: Filed Vitals:   08/01/13 1403  BP: 120/68  Pulse: 65  Temp: 98.5 F (36.9 C)  Resp: 18    Intake/Output Summary (Last 24 hours) at 08/01/13 1713 Last data filed at 08/01/13 1403  Gross per 24 hour  Intake    240 ml  Output   2401 ml  Net  -2161 ml   Filed Weights   07/31/13 2105 08/01/13 0938 08/01/13 1403  Weight: 60.827 kg (134 lb 1.6 oz) 60.1 kg (132 lb 7.9 oz) 58.5 kg (128 lb 15.5 oz)    Exam:   General:  Alert afebrile comfortable  Cardiovascular: s1s2  Respiratory: ctab  Abdomen: soft NT ND BS+  Musculoskeletal: lower back tenderness.  Data Reviewed: Basic Metabolic Panel:  Recent Labs Lab 07/31/13 1725 08/01/13 0951  NA 140 139  K 4.8 4.5  CL 97 97  CO2 28 27  GLUCOSE 85 145*  BUN 42* 51*  CREATININE 6.50* 7.70*  CALCIUM 10.6* 10.1  PHOS  --  3.7    Liver Function Tests:  Recent Labs Lab 07/31/13 1725 08/01/13 0951  AST 14  --   ALT 13  --   ALKPHOS 88  --   BILITOT 0.5  --   PROT 8.1  --   ALBUMIN 3.3* 3.0*   No results found for this basename: LIPASE, AMYLASE,  in the last 168 hours No results found for this basename: AMMONIA,  in the last 168 hours CBC:  Recent Labs Lab 07/31/13 1725 08/01/13 0950  WBC 9.6 7.8  NEUTROABS 6.1  --   HGB 10.1* 10.0*  HCT 29.2* 28.2*  MCV 75.6* 74.2*  PLT 306 337   Cardiac Enzymes: No results found for this basename: CKTOTAL, CKMB, CKMBINDEX, TROPONINI,  in the last 168 hours BNP (last 3 results) No results found for this basename: PROBNP,  in the last 8760 hours CBG: No results found for this basename: GLUCAP,  in the last 168 hours  No results found for this or any previous visit (from the past 240 hour(s)).   Studies: Dg Lumbar Spine 2-3 Views  07/31/2013   CLINICAL DATA:  Low back pain with recent metastatic diagnosis  EXAM: LUMBAR SPINE - 2-3 VIEW  COMPARISON:  Contemporaneously body CT.  FINDINGS: Extensive metastatic disease of the lumbar spine is under appreciated by radiography. There is a lytic lesion in the posterior L5 body and an extensive permeative lesion in the L2  body. There is a superior endplate fracture of L2 noted. Patchy sclerosis, present in the lower L2, upper L3, lower L4, and upper L5 bodies that is most likely degenerative in nature.  IMPRESSION: 1. Metastatic disease in the L2 and L5 bodies. 2. Pathologic L2 superior endplate fracture with mild compression.   Electronically Signed   By: Tiburcio Pea M.D.   On: 07/31/2013 22:23   Ct Chest W Contrast  07/31/2013   CLINICAL DATA:  Bone lesions.  Renal failure.  EXAM: CT CHEST, ABDOMEN, AND PELVIS WITH CONTRAST  TECHNIQUE: Multidetector CT imaging of the chest, abdomen and pelvis was performed following the standard protocol during bolus administration of intravenous contrast.  CONTRAST:  80mL OMNIPAQUE  IOHEXOL 300 MG/ML  SOLN  COMPARISON:  07/11/2005  FINDINGS: CT CHEST FINDINGS  Hyperemic 2.3 cm right paratracheal lymph node. Smaller enlarged precarinal, subcarinal, pretracheal, and prevascular lymph nodes. Trace left small and right pleural effusions. There is a permeative lesion in the proximal right 9th rib with surrounding soft tissue and associated pathologic fracture. Smaller lytic lesion with path fracture through lateral aspect right 6th rib. Old healed left rib fractures. Small subpleural blebs in both upper lobes. There is dependent atelectasis in both lower lobes, right greater than left, with no discrete mass identified.  CT ABDOMEN AND PELVIS FINDINGS  Surgical clips in the gallbladder fossa. Unremarkable liver, spleen, adrenal glands. Innumerable cysts throughout both kidneys. 4.5 cm enhancing mass in the interpolar region of the left kidney. 3.3 cm enhancing mass in the upper pole of the right kidney. Renal veins are patent. No retroperitoneal adenopathy. Atheromatous nondilated abdominal aorta. Stomach, small bowel and colon are nondilated. Expansile lytic enhancing masses involve the superior left pubic ramus and anterior acetabulum, L2 with pathologic fracture, L5 vertebral body, posterior right iliac bone, and intertrochanteric region of the right femur. Enlarged 14 mm right inguinal lymph node. Trace pelvic ascites. No free air.  IMPRESSION: 1. Bilateral enhancing renal masses suggesting either synchronous renal cell carcinoma or metastatic disease. 2. 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 3. Mediastinal adenopathy. 4. Pleural effusions, right greater than left. 5. Innumerable renal cysts suggesting polycystic kidney disease.   Electronically Signed   By: Oley Balm M.D.   On: 07/31/2013 22:25   Ct Abdomen Pelvis W Contrast  07/31/2013   CLINICAL DATA:  Bone lesions.  Renal failure.  EXAM: CT CHEST, ABDOMEN,  AND PELVIS WITH CONTRAST  TECHNIQUE: Multidetector CT imaging of the chest, abdomen and pelvis was performed following the standard protocol during bolus administration of intravenous contrast.  CONTRAST:  80mL OMNIPAQUE IOHEXOL 300 MG/ML  SOLN  COMPARISON:  07/11/2005  FINDINGS: CT CHEST FINDINGS  Hyperemic 2.3 cm right paratracheal lymph node. Smaller enlarged precarinal, subcarinal, pretracheal, and prevascular lymph nodes. Trace left small and right pleural effusions. There is a permeative lesion in the proximal right 9th rib with surrounding soft tissue and associated pathologic fracture. Smaller lytic lesion with path fracture through lateral aspect right 6th rib. Old healed left rib fractures. Small subpleural blebs in both upper lobes. There is dependent atelectasis in both lower lobes, right greater than left, with no discrete mass identified.  CT ABDOMEN AND PELVIS FINDINGS  Surgical clips in the gallbladder fossa. Unremarkable liver, spleen, adrenal glands. Innumerable cysts throughout both kidneys. 4.5 cm enhancing mass in the interpolar region of the left kidney. 3.3 cm enhancing mass in the upper pole of the right kidney.  Renal veins are patent. No retroperitoneal adenopathy. Atheromatous nondilated abdominal aorta. Stomach, small bowel and colon are nondilated. Expansile lytic enhancing masses involve the superior left pubic ramus and anterior acetabulum, L2 with pathologic fracture, L5 vertebral body, posterior right iliac bone, and intertrochanteric region of the right femur. Enlarged 14 mm right inguinal lymph node. Trace pelvic ascites. No free air.  IMPRESSION: 1. Bilateral enhancing renal masses suggesting either synchronous renal cell carcinoma or metastatic disease. 2. 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 3. Mediastinal adenopathy. 4. Pleural effusions, right greater than left. 5. Innumerable renal cysts suggesting  polycystic kidney disease.   Electronically Signed   By: Oley Balm M.D.   On: 07/31/2013 22:25   Mr Hip Left Wo Contrast  07/31/2013   CLINICAL DATA:  Severe left hip pain. Abnormal radiographs dated 07/15/2013  EXAM: MRI OF THE LEFT HIP WITHOUT CONTRAST  TECHNIQUE: Multiplanar, multisequence MR imaging was performed. No intravenous contrast was administered.  COMPARISON:  Radiographs dated 07/15/2013  FINDINGS: There is a 6 x 6 x 5 cm inhomogeneous destructive soft tissue mass destroying the anterior medial and anterior superior aspects of the left acetabulum and the lateral aspect of the left superior pubic ramus. The tumor protrudes into the pelvis. Tumor extends into the left internal obturator muscle.  There is an expansile 2 cm mass in the posterior aspect of the L5 vertebral body slightly protruding into the spinal canal. There is a 2.5 cm destructive lesion in the posterior aspect of the left iliac bone adjacent to the superior aspect of the left sacroiliac joint. There is a 16 mm destructive lesion in the posterior lateral aspect of the proximal right femur just below the right greater trochanter. There is a 4 cm destructive lesion in the medullary canal of the proximal right femoral shaft.  The patient does have fairly severe arthritis of the superior aspect of the left femoral head with joint space narrowing and degenerative changes of the remaining portion of the left acetabulum. There small bilateral hip effusions. There is extensive edema in the adjacent soft tissues around the left hip. The edema extends into the left side of the pelvis.  2.7 cm cyst in the right groin. This is of unknown etiology but appears benign.  IMPRESSION: 1. Numerous destructive bone lesions, with the largest lesion destroying the left acetabulum, consistent with metastatic disease. 2. 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 3. Critical Value/emergent results were called by telephone at the time of  interpretation on 07/31/2013 at 1:15 PM to Dr.KELLIE [facility] , who verbally acknowledged these results.   Electronically Signed   By: Geanie Cooley M.D.   On: 07/31/2013 13:25   Dg Chest Portable 1 View  07/31/2013   CLINICAL DATA:  The shortness breath. DVT.  EXAM: PORTABLE CHEST - 1 VIEW  COMPARISON:  07/15/2013 and 12/02/2009  FINDINGS: Right base wedge-shaped consolidation. This may represent atelectasis. Pulmonary infarct or infiltrate not excluded. Recommend followup until clearance.  Elevated right hemidiaphragm. This limits evaluation of the right lung base.  Pulmonary vascular prominence most notable centrally.  Cardiomegaly.  Tortuous aorta.  The previously questioned nodule adjacent to the left hemidiaphragm is not delineated on the present exam. Recommend followup two view chest when able with attention to this region.  IMPRESSION: Right base wedge-shaped consolidation. This may represent atelectasis. Pulmonary infarct or infiltrate not excluded.  Pulmonary vascular prominence most notable centrally.  Cardiomegaly.  Tortuous aorta.  The previously questioned nodule adjacent to the left hemidiaphragm is not delineated on the present exam. Recommend followup two view chest when able with attention to this region.   Electronically Signed   By: Bridgett Larsson M.D.   On: 07/31/2013 17:52    Scheduled Meds: . amLODipine  10 mg Oral QHS  . calcium carbonate  3 tablet Oral Daily  . darbepoetin (ARANESP) injection - DIALYSIS  100 mcg Intravenous Q Thu-HD  . doxepin  25 mg Oral QHS  . ferric gluconate (FERRLECIT/NULECIT) IV  62.5 mg Intravenous Q Thu-HD  . heparin  5,000 Units Subcutaneous Q8H  . losartan  25 mg Oral QHS  . multivitamin  1 tablet Oral QHS  . [START ON 08/02/2013] pneumococcal 23 valent vaccine  0.5 mL Intramuscular Tomorrow-1000   Continuous Infusions:   Principal Problem:   Pathological fracture- of left pubic rami Active Problems:   Left hip pain   Metastatic  carcinoma   ESRD on hemodialysis   Anemia   Hypertension   Tobacco abuse   Hepatitis C   Secondary hyperparathyroidism (of renal origin)    Time spent: 25 min    Nickolas Chalfin  [facility] Pager [phone]. If 7PM-7AM, please contact night-coverage at [email or web address], password [facility] 08/01/2013, 5:13 PM  LOS: 1 day

## 2013-08-02 ENCOUNTER — Inpatient Hospital Stay (HOSPITAL_COMMUNITY): Payer: Medicare Other

## 2013-08-02 ENCOUNTER — Ambulatory Visit
Admit: 2013-08-02 | Discharge: 2013-08-02 | Disposition: A | Discharge status: Home / Self Care | Payer: Medicare Other | Attending: Radiation Oncology | Admitting: Radiation Oncology

## 2013-08-02 LAB — PROTIME-INR
INR: 1.1 (ref 0.00–1.49)
Prothrombin Time: 14 seconds (ref 11.6–15.2)

## 2013-08-02 LAB — PROTEIN ELECTROPHORESIS, SERUM
Alpha-1-Globulin: 6.8 % — ABNORMAL HIGH (ref 2.9–4.9)
Beta Globulin: 4.7 % (ref 4.7–7.2)
Gamma Globulin: 22 % — ABNORMAL HIGH (ref 11.1–18.8)

## 2013-08-02 MED ORDER — MIDAZOLAM HCL 2 MG/2ML IJ SOLN
INTRAMUSCULAR | Status: AC | PRN
  Administered 2013-08-02: 2 mg via INTRAVENOUS

## 2013-08-02 MED ORDER — LIDOCAINE HCL 1 % IJ SOLN
INTRAMUSCULAR | Status: AC
  Filled 2013-08-02: qty 10

## 2013-08-02 MED ORDER — FENTANYL CITRATE 0.05 MG/ML IJ SOLN
INTRAMUSCULAR | Status: AC
  Filled 2013-08-02: qty 4

## 2013-08-02 MED ORDER — FENTANYL CITRATE 0.05 MG/ML IJ SOLN
INTRAMUSCULAR | Status: AC | PRN
  Administered 2013-08-02: 25 ug via INTRAVENOUS
  Administered 2013-08-02: 50 ug via INTRAVENOUS

## 2013-08-02 MED ORDER — MIDAZOLAM HCL 2 MG/2ML IJ SOLN
INTRAMUSCULAR | Status: AC
  Filled 2013-08-02: qty 4

## 2013-08-02 NOTE — ED Notes (Signed)
Procedure attempted without sedation per MD; pt was having too much discomfort- will begin tele and VS monitoring and moderate sedation.

## 2013-08-02 NOTE — Progress Notes (Signed)
Subjective: Interval History: has complaints Rarm swelling.  Objective: Vital signs in last 24 hours: Temp:  [98.1 F (36.7 C)-99.5 F (37.5 C)] 99.4 F (37.4 C) (10/31 0500) Pulse Rate:  [65-86] 86 (10/31 0500) Resp:  [18] 18 (10/31 0500) BP: (120-142)/(68-88) 134/79 mmHg (10/31 0500) SpO2:  [90 %-95 %] 93 % (10/31 0500) Weight:  [58 kg (127 lb 13.9 oz)-60.1 kg (132 lb 7.9 oz)] 58 kg (127 lb 13.9 oz) (10/30 2100) Weight change: -0.727 kg (-1 lb 9.7 oz)  Intake/Output from previous day: 10/30 0701 - 10/31 0700 In: 480 [P.O.:480] Out: 2401  Intake/Output this shift:    General appearance: alert, cooperative and no distress Resp: diminished breath sounds bilaterally Cardio: S1, S2 normal and systolic murmur: holosystolic 2/6, blowing at apex GI: pos bs ,soft, Extremities: Rarm AVF Upper, Rarm swollen  Lab Results:  Recent Labs  07/31/13 1725 08/01/13 0950  WBC 9.6 7.8  HGB 10.1* 10.0*  HCT 29.2* 28.2*  PLT 306 337   BMET:  Recent Labs  07/31/13 1725 08/01/13 0951  NA 140 139  K 4.8 4.5  CL 97 97  CO2 28 27  GLUCOSE 85 145*  BUN 42* 51*  CREATININE 6.50* 7.70*  CALCIUM 10.6* 10.1   No results found for this basename: PTH,  in the last 72 hours Iron Studies: No results found for this basename: IRON, TIBC, TRANSFERRIN, FERRITIN,  in the last 72 hours  Studies/Results: Dg Lumbar Spine 2-3 Views  07/31/2013   CLINICAL DATA:  Low back pain with recent metastatic diagnosis  EXAM: LUMBAR SPINE - 2-3 VIEW  COMPARISON:  Contemporaneously body CT.  FINDINGS: Extensive metastatic disease of the lumbar spine is under appreciated by radiography. There is a lytic lesion in the posterior L5 body and an extensive permeative lesion in the L2 body. There is a superior endplate fracture of L2 noted. Patchy sclerosis, present in the lower L2, upper L3, lower L4, and upper L5 bodies that is most likely degenerative in nature.  IMPRESSION: 1. Metastatic disease in the L2 and L5  bodies. 2. Pathologic L2 superior endplate fracture with mild compression.   Electronically Signed   By: Tiburcio Pea M.D.   On: 07/31/2013 22:23   Ct Chest W Contrast  07/31/2013   CLINICAL DATA:  Bone lesions.  Renal failure.  EXAM: CT CHEST, ABDOMEN, AND PELVIS WITH CONTRAST  TECHNIQUE: Multidetector CT imaging of the chest, abdomen and pelvis was performed following the standard protocol during bolus administration of intravenous contrast.  CONTRAST:  80mL OMNIPAQUE IOHEXOL 300 MG/ML  SOLN  COMPARISON:  07/11/2005  FINDINGS: CT CHEST FINDINGS  Hyperemic 2.3 cm right paratracheal lymph node. Smaller enlarged precarinal, subcarinal, pretracheal, and prevascular lymph nodes. Trace left small and right pleural effusions. There is a permeative lesion in the proximal right 9th rib with surrounding soft tissue and associated pathologic fracture. Smaller lytic lesion with path fracture through lateral aspect right 6th rib. Old healed left rib fractures. Small subpleural blebs in both upper lobes. There is dependent atelectasis in both lower lobes, right greater than left, with no discrete mass identified.  CT ABDOMEN AND PELVIS FINDINGS  Surgical clips in the gallbladder fossa. Unremarkable liver, spleen, adrenal glands. Innumerable cysts throughout both kidneys. 4.5 cm enhancing mass in the interpolar region of the left kidney. 3.3 cm enhancing mass in the upper pole of the right kidney. Renal veins are patent. No retroperitoneal adenopathy. Atheromatous nondilated abdominal aorta. Stomach, small bowel and colon are nondilated. Expansile  lytic enhancing masses involve the superior left pubic ramus and anterior acetabulum, L2 with pathologic fracture, L5 vertebral body, posterior right iliac bone, and intertrochanteric region of the right femur. Enlarged 14 mm right inguinal lymph node. Trace pelvic ascites. No free air.  IMPRESSION: 1. Bilateral enhancing renal masses suggesting either synchronous renal cell  carcinoma or metastatic disease. 2. 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 3. Mediastinal adenopathy. 4. Pleural effusions, right greater than left. 5. Innumerable renal cysts suggesting polycystic kidney disease.   Electronically Signed   By: Oley Balm M.D.   On: 07/31/2013 22:25   Ct Abdomen Pelvis W Contrast  07/31/2013   CLINICAL DATA:  Bone lesions.  Renal failure.  EXAM: CT CHEST, ABDOMEN, AND PELVIS WITH CONTRAST  TECHNIQUE: Multidetector CT imaging of the chest, abdomen and pelvis was performed following the standard protocol during bolus administration of intravenous contrast.  CONTRAST:  80mL OMNIPAQUE IOHEXOL 300 MG/ML  SOLN  COMPARISON:  07/11/2005  FINDINGS: CT CHEST FINDINGS  Hyperemic 2.3 cm right paratracheal lymph node. Smaller enlarged precarinal, subcarinal, pretracheal, and prevascular lymph nodes. Trace left small and right pleural effusions. There is a permeative lesion in the proximal right 9th rib with surrounding soft tissue and associated pathologic fracture. Smaller lytic lesion with path fracture through lateral aspect right 6th rib. Old healed left rib fractures. Small subpleural blebs in both upper lobes. There is dependent atelectasis in both lower lobes, right greater than left, with no discrete mass identified.  CT ABDOMEN AND PELVIS FINDINGS  Surgical clips in the gallbladder fossa. Unremarkable liver, spleen, adrenal glands. Innumerable cysts throughout both kidneys. 4.5 cm enhancing mass in the interpolar region of the left kidney. 3.3 cm enhancing mass in the upper pole of the right kidney. Renal veins are patent. No retroperitoneal adenopathy. Atheromatous nondilated abdominal aorta. Stomach, small bowel and colon are nondilated. Expansile lytic enhancing masses involve the superior left pubic ramus and anterior acetabulum, L2 with pathologic fracture, L5 vertebral body, posterior right iliac  bone, and intertrochanteric region of the right femur. Enlarged 14 mm right inguinal lymph node. Trace pelvic ascites. No free air.  IMPRESSION: 1. Bilateral enhancing renal masses suggesting either synchronous renal cell carcinoma or metastatic disease. 2. 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 3. Mediastinal adenopathy. 4. Pleural effusions, right greater than left. 5. Innumerable renal cysts suggesting polycystic kidney disease.   Electronically Signed   By: Oley Balm M.D.   On: 07/31/2013 22:25   Mr Hip Left Wo Contrast  07/31/2013   CLINICAL DATA:  Severe left hip pain. Abnormal radiographs dated 07/15/2013  EXAM: MRI OF THE LEFT HIP WITHOUT CONTRAST  TECHNIQUE: Multiplanar, multisequence MR imaging was performed. No intravenous contrast was administered.  COMPARISON:  Radiographs dated 07/15/2013  FINDINGS: There is a 6 x 6 x 5 cm inhomogeneous destructive soft tissue mass destroying the anterior medial and anterior superior aspects of the left acetabulum and the lateral aspect of the left superior pubic ramus. The tumor protrudes into the pelvis. Tumor extends into the left internal obturator muscle.  There is an expansile 2 cm mass in the posterior aspect of the L5 vertebral body slightly protruding into the spinal canal. There is a 2.5 cm destructive lesion in the posterior aspect of the left iliac bone adjacent to the superior aspect of the left sacroiliac joint. There is a 16 mm destructive lesion  in the posterior lateral aspect of the proximal right femur just below the right greater trochanter. There is a 4 cm destructive lesion in the medullary canal of the proximal right femoral shaft.  The patient does have fairly severe arthritis of the superior aspect of the left femoral head with joint space narrowing and degenerative changes of the remaining portion of the left acetabulum. There small bilateral hip effusions.  There is extensive edema in the adjacent soft tissues around the left hip. The edema extends into the left side of the pelvis.  2.7 cm cyst in the right groin. This is of unknown etiology but appears benign.  IMPRESSION: 1. Numerous destructive bone lesions, with the largest lesion destroying the left acetabulum, consistent with metastatic disease. 2. 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 3. Critical Value/emergent results were called by telephone at the time of interpretation on 07/31/2013 at 1:15 PM to Dr.KELLIE [facility] , who verbally acknowledged these results.   Electronically Signed   By: Geanie Cooley M.D.   On: 07/31/2013 13:25   Dg Chest Portable 1 View  07/31/2013   CLINICAL DATA:  The shortness breath. DVT.  EXAM: PORTABLE CHEST - 1 VIEW  COMPARISON:  07/15/2013 and 12/02/2009  FINDINGS: Right base wedge-shaped consolidation. This may represent atelectasis. Pulmonary infarct or infiltrate not excluded. Recommend followup until clearance.  Elevated right hemidiaphragm. This limits evaluation of the right lung base.  Pulmonary vascular prominence most notable centrally.  Cardiomegaly.  Tortuous aorta.  The previously questioned nodule adjacent to the left hemidiaphragm is not delineated on the present exam. Recommend followup two view chest when able with attention to this region.  IMPRESSION: Right base wedge-shaped consolidation. This may represent atelectasis. Pulmonary infarct or infiltrate not excluded.  Pulmonary vascular prominence most notable centrally.  Cardiomegaly.  Tortuous aorta.  The previously questioned nodule adjacent to the left hemidiaphragm is not delineated on the present exam. Recommend followup two view chest when able with attention to this region.   Electronically Signed   By: Bridgett Larsson M.D.   On: 07/31/2013 17:52    I have reviewed the patient's current medications.  Assessment/Plan: 1 CRF HD in am .  Lower vol to help lessen bp med 2 Anemia stable 3 HTN  lower vol and meds 4 HPTH 5 Metastatic disease suspect Renal Cell , get bx 6 Hep C P shuntogram. HD, bx. Epo, bp meds    LOS: 2 days   [name] 08/02/2013,8:30 AM

## 2013-08-02 NOTE — Progress Notes (Signed)
[facility] PROGRESS NOTE  [name] ZOX:[id number] DOB: 1951-09-18 DOA: 07/31/2013 PCP: Dyke Maes, MD  Assessment/Plan: Left hip pain/metastatic disease to left pubic rami & acetabulum  - Etiology of primary unknown.  - Admitted to medical floor.  - CT abd and pelvis and chest revealed multiple renal masses and LAD.  - Check  SPEP to evaluate for MM.  - Oncology ocnsulted and recommendations given.   - Bedrest and nonweightbearing on left lower extremity.  - Orthopedic consulted by EDP-patient may eventually require stabilization surgery for left hip.  Pain control. - IR consulted for CT guided biopsy, radiation oncology consulted today.  ESRD on dialysis  - Management per nephrology.  Hypertension  - Controlled.  Anemia  - Secondary to chronic kidney disease  - Management per nephrology.  Tobacco abuse  - Cessation counseled.   Code Status: full code Family Communication: none atbedside Disposition Plan: pending.    Consultants:  IR  ONCOLOGY  RADIATION ONCOLOGY  NEPHROLOGY  ORTHOPEDICS  Procedures:  Biopsy of the inguinal LAD  Antibiotics:  none  HPI/Subjective: Comfortable. Back pain and hip pain 5/10 . Spoke to pt in detail of the CT chest and abdomen results.   Objective: Filed Vitals:   08/02/13 1433  BP: 138/87  Pulse: 87  Temp:   Resp:     Intake/Output Summary (Last 24 hours) at 08/02/13 1635 Last data filed at 08/02/13 0616  Gross per 24 hour  Intake    240 ml  Output      0 ml  Net    240 ml   Filed Weights   08/01/13 0938 08/01/13 1403 08/01/13 2100  Weight: 60.1 kg (132 lb 7.9 oz) 58.5 kg (128 lb 15.5 oz) 58 kg (127 lb 13.9 oz)    Exam:   General:  Alert afebrile comfortable  Cardiovascular: s1s2  Respiratory: ctab  Abdomen: soft NT ND BS+  Musculoskeletal: lower back tenderness.  Data Reviewed: Basic Metabolic Panel:  Recent Labs Lab 07/31/13 1725 08/01/13 0951  NA 140 139  K 4.8 4.5   CL 97 97  CO2 28 27  GLUCOSE 85 145*  BUN 42* 51*  CREATININE 6.50* 7.70*  CALCIUM 10.6* 10.1  PHOS  --  3.7   Liver Function Tests:  Recent Labs Lab 07/31/13 1725 08/01/13 0951  AST 14  --   ALT 13  --   ALKPHOS 88  --   BILITOT 0.5  --   PROT 8.1  --   ALBUMIN 3.3* 3.0*   No results found for this basename: LIPASE, AMYLASE,  in the last 168 hours No results found for this basename: AMMONIA,  in the last 168 hours CBC:  Recent Labs Lab 07/31/13 1725 08/01/13 0950  WBC 9.6 7.8  NEUTROABS 6.1  --   HGB 10.1* 10.0*  HCT 29.2* 28.2*  MCV 75.6* 74.2*  PLT 306 337   Cardiac Enzymes: No results found for this basename: CKTOTAL, CKMB, CKMBINDEX, TROPONINI,  in the last 168 hours BNP (last 3 results) No results found for this basename: PROBNP,  in the last 8760 hours CBG: No results found for this basename: GLUCAP,  in the last 168 hours  No results found for this or any previous visit (from the past 240 hour(s)).   Studies: Dg Lumbar Spine 2-3 Views  07/31/2013   CLINICAL DATA:  Low back pain with recent metastatic diagnosis  EXAM: LUMBAR SPINE - 2-3 VIEW  COMPARISON:  Contemporaneously body CT.  FINDINGS: Extensive metastatic  disease of the lumbar spine is under appreciated by radiography. There is a lytic lesion in the posterior L5 body and an extensive permeative lesion in the L2 body. There is a superior endplate fracture of L2 noted. Patchy sclerosis, present in the lower L2, upper L3, lower L4, and upper L5 bodies that is most likely degenerative in nature.  IMPRESSION: 1. Metastatic disease in the L2 and L5 bodies. 2. Pathologic L2 superior endplate fracture with mild compression.   Electronically Signed   By: Tiburcio Pea M.D.   On: 07/31/2013 22:23   Ct Chest W Contrast  07/31/2013   CLINICAL DATA:  Bone lesions.  Renal failure.  EXAM: CT CHEST, ABDOMEN, AND PELVIS WITH CONTRAST  TECHNIQUE: Multidetector CT imaging of the chest, abdomen and pelvis was  performed following the standard protocol during bolus administration of intravenous contrast.  CONTRAST:  80mL OMNIPAQUE IOHEXOL 300 MG/ML  SOLN  COMPARISON:  07/11/2005  FINDINGS: CT CHEST FINDINGS  Hyperemic 2.3 cm right paratracheal lymph node. Smaller enlarged precarinal, subcarinal, pretracheal, and prevascular lymph nodes. Trace left small and right pleural effusions. There is a permeative lesion in the proximal right 9th rib with surrounding soft tissue and associated pathologic fracture. Smaller lytic lesion with path fracture through lateral aspect right 6th rib. Old healed left rib fractures. Small subpleural blebs in both upper lobes. There is dependent atelectasis in both lower lobes, right greater than left, with no discrete mass identified.  CT ABDOMEN AND PELVIS FINDINGS  Surgical clips in the gallbladder fossa. Unremarkable liver, spleen, adrenal glands. Innumerable cysts throughout both kidneys. 4.5 cm enhancing mass in the interpolar region of the left kidney. 3.3 cm enhancing mass in the upper pole of the right kidney. Renal veins are patent. No retroperitoneal adenopathy. Atheromatous nondilated abdominal aorta. Stomach, small bowel and colon are nondilated. Expansile lytic enhancing masses involve the superior left pubic ramus and anterior acetabulum, L2 with pathologic fracture, L5 vertebral body, posterior right iliac bone, and intertrochanteric region of the right femur. Enlarged 14 mm right inguinal lymph node. Trace pelvic ascites. No free air.  IMPRESSION: 1. Bilateral enhancing renal masses suggesting either synchronous renal cell carcinoma or metastatic disease. 2. 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 3. Mediastinal adenopathy. 4. Pleural effusions, right greater than left. 5. Innumerable renal cysts suggesting polycystic kidney disease.   Electronically Signed   By: Oley Balm M.D.   On: 07/31/2013 22:25    Ct Abdomen Pelvis W Contrast  07/31/2013   CLINICAL DATA:  Bone lesions.  Renal failure.  EXAM: CT CHEST, ABDOMEN, AND PELVIS WITH CONTRAST  TECHNIQUE: Multidetector CT imaging of the chest, abdomen and pelvis was performed following the standard protocol during bolus administration of intravenous contrast.  CONTRAST:  80mL OMNIPAQUE IOHEXOL 300 MG/ML  SOLN  COMPARISON:  07/11/2005  FINDINGS: CT CHEST FINDINGS  Hyperemic 2.3 cm right paratracheal lymph node. Smaller enlarged precarinal, subcarinal, pretracheal, and prevascular lymph nodes. Trace left small and right pleural effusions. There is a permeative lesion in the proximal right 9th rib with surrounding soft tissue and associated pathologic fracture. Smaller lytic lesion with path fracture through lateral aspect right 6th rib. Old healed left rib fractures. Small subpleural blebs in both upper lobes. There is dependent atelectasis in both lower lobes, right greater than left, with no discrete mass identified.  CT ABDOMEN AND PELVIS FINDINGS  Surgical clips in the gallbladder fossa. Unremarkable liver, spleen, adrenal glands. Innumerable  cysts throughout both kidneys. 4.5 cm enhancing mass in the interpolar region of the left kidney. 3.3 cm enhancing mass in the upper pole of the right kidney. Renal veins are patent. No retroperitoneal adenopathy. Atheromatous nondilated abdominal aorta. Stomach, small bowel and colon are nondilated. Expansile lytic enhancing masses involve the superior left pubic ramus and anterior acetabulum, L2 with pathologic fracture, L5 vertebral body, posterior right iliac bone, and intertrochanteric region of the right femur. Enlarged 14 mm right inguinal lymph node. Trace pelvic ascites. No free air.  IMPRESSION: 1. Bilateral enhancing renal masses suggesting either synchronous renal cell carcinoma or metastatic disease. 2. Lytic osseous metastatic disease involving right ribs, L2 with pathologic fracture, L5, pelvis, and right  femur. 3. Mediastinal adenopathy. 4. Pleural effusions, right greater than left. 5. Innumerable renal cysts suggesting polycystic kidney disease.   Electronically Signed   By: Oley Balm M.D.   On: 07/31/2013 22:25   Dg Chest Portable 1 View  07/31/2013   CLINICAL DATA:  The shortness breath. DVT.  EXAM: PORTABLE CHEST - 1 VIEW  COMPARISON:  07/15/2013 and 12/02/2009  FINDINGS: Right base wedge-shaped consolidation. This may represent atelectasis. Pulmonary infarct or infiltrate not excluded. Recommend followup until clearance.  Elevated right hemidiaphragm. This limits evaluation of the right lung base.  Pulmonary vascular prominence most notable centrally.  Cardiomegaly.  Tortuous aorta.  The previously questioned nodule adjacent to the left hemidiaphragm is not delineated on the present exam. Recommend followup two view chest when able with attention to this region.  IMPRESSION: Right base wedge-shaped consolidation. This may represent atelectasis. Pulmonary infarct or infiltrate not excluded.  Pulmonary vascular prominence most notable centrally.  Cardiomegaly.  Tortuous aorta.  The previously questioned nodule adjacent to the left hemidiaphragm is not delineated on the present exam. Recommend followup two view chest when able with attention to this region.   Electronically Signed   By: Bridgett Larsson M.D.   On: 07/31/2013 17:52    Scheduled Meds: . amLODipine  10 mg Oral QHS  . calcium carbonate  3 tablet Oral Daily  . darbepoetin (ARANESP) injection - DIALYSIS  100 mcg Intravenous Q Thu-HD  . doxepin  25 mg Oral QHS  . fentaNYL      . ferric gluconate (FERRLECIT/NULECIT) IV  62.5 mg Intravenous Q Thu-HD  . heparin  5,000 Units Subcutaneous Q8H  . lidocaine      . lidocaine      . losartan  25 mg Oral QHS  . midazolam      . multivitamin  1 tablet Oral QHS   Continuous Infusions:   Principal Problem:   Pathological fracture- of left pubic rami Active Problems:   Left hip pain    Metastatic carcinoma   ESRD on hemodialysis   Anemia in chronic kidney disease   Hypertension   Tobacco abuse   Hepatitis C   Secondary hyperparathyroidism (of renal origin)    Time spent: 25 min    [name]  [facility] Pager [phone]. If 7PM-7AM, please contact night-coverage at [email or web address], password [facility] 08/02/2013, 4:35 PM  LOS: 2 days

## 2013-08-02 NOTE — Progress Notes (Signed)
Medical Oncology I spoke with nurse. No acute overnight events noted.  The patient was not in his room as he is obtaining a CT-guided biopsy of soft tissue lesion in left pelvis.  We will await the official pathology report and rely further recommendations.  MM less likely based on negative SPEP.

## 2013-08-02 NOTE — Procedures (Signed)
CT guided core biopsies of soft tissue lesion in left pelvis.  No immediate complication.

## 2013-08-03 ENCOUNTER — Inpatient Hospital Stay (HOSPITAL_COMMUNITY): Payer: Medicare Other

## 2013-08-03 DIAGNOSIS — S32599A Other specified fracture of unspecified pubis, initial encounter for closed fracture: Secondary | ICD-10-CM

## 2013-08-03 DIAGNOSIS — C649 Malignant neoplasm of unspecified kidney, except renal pelvis: Secondary | ICD-10-CM | POA: Insufficient documentation

## 2013-08-03 HISTORY — DX: Other specified fracture of unspecified pubis, initial encounter for closed fracture: S32.599A

## 2013-08-03 HISTORY — DX: Malignant neoplasm of unspecified kidney, except renal pelvis: C64.9

## 2013-08-03 LAB — CBC
Hemoglobin: 9.8 g/dL — ABNORMAL LOW (ref 13.0–17.0)
MCH: 26.2 pg (ref 26.0–34.0)
MCV: 75.1 fL — ABNORMAL LOW (ref 78.0–100.0)
Platelets: 301 10*3/uL (ref 150–400)
RBC: 3.74 MIL/uL — ABNORMAL LOW (ref 4.22–5.81)
RDW: 17 % — ABNORMAL HIGH (ref 11.5–15.5)
WBC: 9.3 10*3/uL (ref 4.0–10.5)

## 2013-08-03 LAB — COMPREHENSIVE METABOLIC PANEL
ALT: 10 U/L (ref 0–53)
AST: 14 U/L (ref 0–37)
CO2: 26 mEq/L (ref 19–32)
Calcium: 9.2 mg/dL (ref 8.4–10.5)
Chloride: 94 mEq/L — ABNORMAL LOW (ref 96–112)
Creatinine, Ser: 7.24 mg/dL — ABNORMAL HIGH (ref 0.50–1.35)
GFR calc Af Amer: 8 mL/min — ABNORMAL LOW (ref >90)
GFR calc non Af Amer: 7 mL/min — ABNORMAL LOW (ref >90)
Glucose, Bld: 141 mg/dL — ABNORMAL HIGH (ref 70–99)
Potassium: 4.3 mEq/L (ref 3.5–5.1)
Total Bilirubin: 0.3 mg/dL (ref 0.3–1.2)

## 2013-08-03 MED ORDER — IOHEXOL 300 MG/ML  SOLN
100.0000 mL | Freq: Once | INTRAMUSCULAR | Status: AC | PRN
  Administered 2013-08-03: 90 mL via INTRAVENOUS

## 2013-08-03 MED ORDER — NEPRO/CARBSTEADY PO LIQD
237.0000 mL | ORAL | Status: DC | PRN
  Filled 2013-08-03: qty 237

## 2013-08-03 MED ORDER — ALTEPLASE 2 MG IJ SOLR
2.0000 mg | Freq: Once | INTRAMUSCULAR | Status: DC | PRN
  Filled 2013-08-03: qty 2

## 2013-08-03 MED ORDER — SENNOSIDES-DOCUSATE SODIUM 8.6-50 MG PO TABS
1.0000 | ORAL_TABLET | Freq: Two times a day (BID) | ORAL | Status: DC
  Administered 2013-08-03 – 2013-08-07 (×9): 1 via ORAL
  Filled 2013-08-03 (×11): qty 1

## 2013-08-03 MED ORDER — POLYETHYLENE GLYCOL 3350 17 G PO PACK
17.0000 g | PACK | Freq: Two times a day (BID) | ORAL | Status: DC
  Administered 2013-08-03 – 2013-08-07 (×8): 17 g via ORAL
  Filled 2013-08-03 (×12): qty 1

## 2013-08-03 MED ORDER — SODIUM CHLORIDE 0.9 % IV SOLN: 100.0000 mL | INTRAVENOUS | Status: DC | PRN

## 2013-08-03 MED ORDER — LIDOCAINE HCL (PF) 1 % IJ SOLN: 5.0000 mL | INTRAMUSCULAR | Status: DC | PRN

## 2013-08-03 MED ORDER — FENTANYL CITRATE 0.05 MG/ML IJ SOLN
INTRAMUSCULAR | Status: AC
  Administered 2013-08-03: 50 ug
  Filled 2013-08-03: qty 2

## 2013-08-03 MED ORDER — HEPARIN SODIUM (PORCINE) 1000 UNIT/ML DIALYSIS
40.0000 [IU]/kg | Freq: Once | INTRAMUSCULAR | Status: AC
  Administered 2013-08-03: 2300 [IU] via INTRAVENOUS_CENTRAL

## 2013-08-03 MED ORDER — HEPARIN SODIUM (PORCINE) 1000 UNIT/ML DIALYSIS: 1000.0000 [IU] | INTRAMUSCULAR | Status: DC | PRN

## 2013-08-03 MED ORDER — LIDOCAINE-PRILOCAINE 2.5-2.5 % EX CREA
1.0000 "application " | TOPICAL_CREAM | CUTANEOUS | Status: DC | PRN
  Filled 2013-08-03: qty 5

## 2013-08-03 MED ORDER — PENTAFLUOROPROP-TETRAFLUOROETH EX AERO: 1.0000 "application " | INHALATION_SPRAY | CUTANEOUS | Status: DC | PRN

## 2013-08-03 NOTE — Procedures (Signed)
Post-Procedure Note  Pre-operative Diagnosis: ESRD with right arm swelling.       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  Venous angioplasty of central venous stenosis with 8 mm and 10 mm balloons.    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  Markedly improved flow after balloon angioplasty of the lesion.    Complications: none     Condition: stable  Plan: Remove suture in 24 hours. Ok to use fistula for dialysis.  Follow right arm swelling.

## 2013-08-03 NOTE — Progress Notes (Signed)
Subjective:  No complaints, comfortable after biopsy at left pelvis yesterday  Objective: Vital signs in last 24 hours: Temp:  [98 F (36.7 C)-98.3 F (36.8 C)] 98 F (36.7 C) (11/01 0557) Pulse Rate:  [75-88] 83 (11/01 0557) Resp:  [4-18] 18 (11/01 0557) BP: (131-140)/(83-96) 131/87 mmHg (11/01 0557) SpO2:  [95 %-100 %] 95 % (11/01 0557) Weight:  [59.784 kg (131 lb 12.8 oz)] 59.784 kg (131 lb 12.8 oz) (10/31 2118) Weight change: -0.316 kg (-11.1 oz)  Intake/Output from previous day: 10/31 0701 - 11/01 0700 In: 240 [P.O.:240] Out: -    EXAM: General appearance:  Alert, in no apparent distress Resp:  CTA without rales, rhonchi, or wheezes diminished bs Cardio:  RRR with Gr II/VI systolic murmur, no rub GI: + BS, soft and nontender liver down 5 cm Extremities: No LE edema, R arm swelling Access:  AVF @ RUA with + bruit  Lab Results:  Recent Labs  07/31/13 1725 08/01/13 0950  WBC 9.6 7.8  HGB 10.1* 10.0*  HCT 29.2* 28.2*  PLT 306 337   BMET:  Recent Labs  07/31/13 1725 08/01/13 0951  NA 140 139  K 4.8 4.5  CL 97 97  CO2 28 27  GLUCOSE 85 145*  BUN 42* 51*  CREATININE 6.50* 7.70*  CALCIUM 10.6* 10.1  ALBUMIN 3.3* 3.0*   No results found for this basename: PTH,  in the last 72 hours Iron Studies: No results found for this basename: IRON, TIBC, TRANSFERRIN, FERRITIN,  in the last 72 hours  Dialysis Orders: [facility] hr Optiflux 160 2K 3.5 Ca bath, Qb 450 FR A 1.5 EDW 60 (gets to edw gains 1-2 L) right upper AVF no profile, no var Na Hectorol1, Epo 1200 (just increased from 1000) venofer 50/week   Assessment/Plan: 1. Metastatic cancer - unknown source, s/p CT guided-core biopsy of soft tissue lesion in L pelvis yesterday, pathology pending. 2. ESRD - HD on TTS @ [place]; K 4.5.  HD pending. 3. HTN/Volume - BP 131/87 on Amlodipine 10 mg qhs, Losartan 25 mg qhs; wt 59.8 kg with EDW 60.  Lower EDW. See if can lower meds 4. Anemia - Hgb 10 on Aranesp 100 mcg  & IV Fe on Thurs. 5. Sec HPT - s/p parathyroidectomy; Ca 10.1 (10.9 corrected), P 3.7; no Hectorol, Tums with meals. 6. Nutrition - Alb 3, renal diet, vitamin. 7. Swollen right arm - central stenosis sec to previous catheters or sec to metastatic process; fistulogram pending per IR.   LOS: 3 days   LYLES,CHARLES 08/03/2013,7:41 AM I have seen and examined this patient and agree with the plan of care seen,examined , evaluated and plan discussed.  .  Gillermo Poch L 08/03/2013, 8:25 AM

## 2013-08-03 NOTE — Progress Notes (Signed)
[facility] PROGRESS NOTE  [name] ZOX:[id number] DOB: 16-Jun-1951 DOA: 07/31/2013 PCP: Dyke Maes, MD  Assessment/Plan: Left hip pain/metastatic disease to left pubic rami & acetabulum  - Etiology of primary unknown.  - Admitted to medical floor.  - CT abd and pelvis and chest revealed multiple renal masses and LAD.  - Check  SPEP to evaluate for MM.  - Oncology ocnsulted and recommendations given.   - Bedrest and nonweightbearing on left lower extremity.  - Orthopedic consulted by EDP-patient may eventually require stabilization surgery for left hip.  Pain control. Recommendations are for radiation therapy.  - IR consulted for CT guided biopsy he underwent biopsy on 10/31, radiation oncology consulted.  ESRD on dialysis  - Management per nephrology.  Hypertension  - Controlled.  Anemia  - Secondary to chronic kidney disease  - Management per nephrology.  Tobacco abuse  - Cessation counseled.   Code Status: full code Family Communication: none atbedside, tried reaching his sister. Called on her phone and home and left a message.  Disposition Plan: pending.    Consultants:  IR  ONCOLOGY  RADIATION ONCOLOGY  NEPHROLOGY  ORTHOPEDICS  Procedures:  Biopsy of the inguinal LAD  Antibiotics:  none  HPI/Subjective: Comfortable. Back pain and hip pain 5/10 . Spoke to pt in detail of the CT chest and abdomen results. He requested to talk to his sister.   Objective: Filed Vitals:   08/03/13 1530  BP: 143/84  Pulse: 80  Temp:   Resp:     Intake/Output Summary (Last 24 hours) at 08/03/13 1556 Last data filed at 08/03/13 1300  Gross per 24 hour  Intake    960 ml  Output      0 ml  Net    960 ml   Filed Weights   08/01/13 2100 08/02/13 2118 08/03/13 1355  Weight: 58 kg (127 lb 13.9 oz) 59.784 kg (131 lb 12.8 oz) 62.6 kg (138 lb 0.1 oz)    Exam:   General:  Alert afebrile comfortable  Cardiovascular: s1s2  Respiratory:  ctab  Abdomen: soft NT ND BS+  Musculoskeletal: lower back tenderness.  Data Reviewed: Basic Metabolic Panel:  Recent Labs Lab 07/31/13 1725 08/01/13 0951 08/03/13 1442  NA 140 139 134*  K 4.8 4.5 4.3  CL 97 97 94*  CO2 28 27 26   GLUCOSE 85 145* 141*  BUN 42* 51* 43*  CREATININE 6.50* 7.70* 7.24*  CALCIUM 10.6* 10.1 9.2  PHOS  --  3.7 2.1*   Liver Function Tests:  Recent Labs Lab 07/31/13 1725 08/01/13 0951 08/03/13 1442  AST 14  --  14  ALT 13  --  10  ALKPHOS 88  --  86  BILITOT 0.5  --  0.3  PROT 8.1  --  7.5  ALBUMIN 3.3* 3.0* 2.9*   No results found for this basename: LIPASE, AMYLASE,  in the last 168 hours No results found for this basename: AMMONIA,  in the last 168 hours CBC:  Recent Labs Lab 07/31/13 1725 08/01/13 0950 08/03/13 1442  WBC 9.6 7.8 9.3  NEUTROABS 6.1  --   --   HGB 10.1* 10.0* 9.8*  HCT 29.2* 28.2* 28.1*  MCV 75.6* 74.2* 75.1*  PLT 306 337 301   Cardiac Enzymes: No results found for this basename: CKTOTAL, CKMB, CKMBINDEX, TROPONINI,  in the last 168 hours BNP (last 3 results) No results found for this basename: PROBNP,  in the last 8760 hours CBG: No results found for this basename:  GLUCAP,  in the last 168 hours  No results found for this or any previous visit (from the past 240 hour(s)).   Studies: Ir Pta Venous Right  08/03/2013   CLINICAL DATA:  62 year old with end-stage renal disease and right arm fistula. The patient has right arm swelling.  EXAM: RIGHT UPPER EXTREMITY FISTULOGRAM; CENTRAL VENOUS ANGIOPLASTY  Physician: Rachelle Hora. Lowella Dandy, MD  MEDICATIONS AND MEDICAL HISTORY: Fentanyl 50 mcg.  FLUOROSCOPY TIME:  2 min and 48 seconds  PROCEDURE: The procedure was explained to the patient. The risks and benefits of the procedure were discussed and the patient's questions were addressed. Informed consent was obtained from the patient. Right upper extremity fistula was accessed with an angiocath. Fistulogram images were obtained.  Arm was prepped and draped in a sterile fashion. Maximal barrier sterile technique was utilized including caps, mask, sterile gowns, sterile gloves, sterile drape, hand hygiene and skin antiseptic. The skin was anesthetized with 1% lidocaine. Angiocath was exchanged for a 7-French vascular sheath over a Bentson wire. A 5 [place] Kumpe catheter was easily advanced into the right central veins. Additional angiography was performed. A Bentson wire was easily advanced into the superior vena cava and the inferior vena cava. A 8 mm x 40 mm Conquest balloon was positioned at the junction of the right innominate vein and SVC. The balloon was inflated without complication. A significant waist formation was noted. Follow up angiography demonstrated improved flow in this area. This focal stenosis was treated a second time with a 10 mm x 40 mm balloon. This balloon was inflated for 2 minutes. The patient complained of some chest discomfort during this balloon inflation. Follow up angiography was performed. The wire was removed. The catheter was removed with a pursestring suture.  FINDINGS: The right upper arm fistula is patent. There was a focal critical stenosis at the junction of the right innominate vein and SVC. There were large collateral vessels in the right upper chest and neck region. The post angioplasty images demonstrated markedly improved flow through the right innominate vein. There was still some filling of the collateral vessels in the right upper chest and neck.  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 This stenosis was successfully treated with balloon angioplasty.   Electronically Signed   By: Richarda Overlie M.D.   On: 08/03/2013 12:38   Ct Biopsy  08/02/2013   CLINICAL DATA:  62 year old with renal lesions and multiple bone lesions. Tissue diagnosis is needed.  EXAM: CT BIOPSY OF LEFT PELVIC SOFT TISSUE MASS.  Physician: Rachelle Hora. Henn, MD  MEDICATIONS: Versed 2  mg, fentanyl 75 mcg. A radiology nurse monitored the patient for moderate sedation.  ANESTHESIA/SEDATION: Moderate sedation time: 17 min  PROCEDURE: The procedure was explained to the patient. The risks and benefits of the procedure were discussed and the patient's questions were addressed. Informed consent was obtained from the patient. The patient was placed supine on the CT scanner. Images through the pelvis were obtained. The lesion in the anterior left hip was identified. The left anterior pelvis was prepped and draped in a sterile fashion. Skin was anesthetized with 1% lidocaine. Using CT guidance, a 17 gauge needle was directed into the pelvic soft tissue mass. Needle position was confirmed with CT. Three core biopsies were obtained with an 18 gauge device. Specimens were placed in formalin.  COMPLICATIONS: None  FINDINGS: There is a destructive soft tissue mass involving the anterior aspect of the left iliac  bone and the left acetabulum. Needle position was confirmed within the soft tissue lesion.  IMPRESSION: CT-guided biopsy of the left pelvic soft tissue mass.   Electronically Signed   By: Richarda Overlie M.D.   On: 08/02/2013 17:07   Ir Shuntogram/ Fistulagram Right Mod Sed  08/03/2013   CLINICAL DATA:  62 year old with end-stage renal disease and right arm fistula. The patient has right arm swelling.  EXAM: RIGHT UPPER EXTREMITY FISTULOGRAM; CENTRAL VENOUS ANGIOPLASTY  Physician: Rachelle Hora. Lowella Dandy, MD  MEDICATIONS AND MEDICAL HISTORY: Fentanyl 50 mcg.  FLUOROSCOPY TIME:  2 min and 48 seconds  PROCEDURE: The procedure was explained to the patient. The risks and benefits of the procedure were discussed and the patient's questions were addressed. Informed consent was obtained from the patient. Right upper extremity fistula was accessed with an angiocath. Fistulogram images were obtained. Arm was prepped and draped in a sterile fashion. Maximal barrier sterile technique was utilized including caps, mask, sterile  gowns, sterile gloves, sterile drape, hand hygiene and skin antiseptic. The skin was anesthetized with 1% lidocaine. Angiocath was exchanged for a 7-French vascular sheath over a Bentson wire. A 5 [place] Kumpe catheter was easily advanced into the right central veins. Additional angiography was performed. A Bentson wire was easily advanced into the superior vena cava and the inferior vena cava. A 8 mm x 40 mm Conquest balloon was positioned at the junction of the right innominate vein and SVC. The balloon was inflated without complication. A significant waist formation was noted. Follow up angiography demonstrated improved flow in this area. This focal stenosis was treated a second time with a 10 mm x 40 mm balloon. This balloon was inflated for 2 minutes. The patient complained of some chest discomfort during this balloon inflation. Follow up angiography was performed. The wire was removed. The catheter was removed with a pursestring suture.  FINDINGS: The right upper arm fistula is patent. There was a focal critical stenosis at the junction of the right innominate vein and SVC. There were large collateral vessels in the right upper chest and neck region. The post angioplasty images demonstrated markedly improved flow through the right innominate vein. There was still some filling of the collateral vessels in the right upper chest and neck.  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 This stenosis was successfully treated with balloon angioplasty.   Electronically Signed   By: Richarda Overlie M.D.   On: 08/03/2013 12:38    Scheduled Meds: . amLODipine  10 mg Oral QHS  . calcium carbonate  3 tablet Oral Daily  . darbepoetin (ARANESP) injection - DIALYSIS  100 mcg Intravenous Q Thu-HD  . doxepin  25 mg Oral QHS  . ferric gluconate (FERRLECIT/NULECIT) IV  62.5 mg Intravenous Q Thu-HD  . heparin  5,000 Units Subcutaneous Q8H  . losartan  25 mg Oral QHS  .  multivitamin  1 tablet Oral QHS  . polyethylene glycol  17 g Oral BID  . senna-docusate  1 tablet Oral BID   Continuous Infusions:   Principal Problem:   Pathological fracture- of left pubic rami Active Problems:   Left hip pain   Metastatic carcinoma   ESRD on hemodialysis   Anemia in chronic kidney disease   Hypertension   Tobacco abuse   Hepatitis C   Secondary hyperparathyroidism (of renal origin)    Time spent: 25 min    Johnika Escareno  [facility] Pager [phone]. If 7PM-7AM, please contact  night-coverage at [email or web address], password [facility] 08/03/2013, 3:56 PM  LOS: 3 days

## 2013-08-04 DIAGNOSIS — N189 Chronic kidney disease, unspecified: Secondary | ICD-10-CM

## 2013-08-04 DIAGNOSIS — D631 Anemia in chronic kidney disease: Secondary | ICD-10-CM

## 2013-08-04 LAB — BASIC METABOLIC PANEL
CO2: 29 mEq/L (ref 19–32)
Calcium: 8.8 mg/dL (ref 8.4–10.5)
Creatinine, Ser: 4.66 mg/dL — ABNORMAL HIGH (ref 0.50–1.35)
GFR calc Af Amer: 14 mL/min — ABNORMAL LOW (ref >90)
GFR calc non Af Amer: 12 mL/min — ABNORMAL LOW (ref >90)
Potassium: 4.1 mEq/L (ref 3.5–5.1)
Sodium: 136 mEq/L (ref 135–145)

## 2013-08-04 LAB — CBC
MCH: 25.9 pg — ABNORMAL LOW (ref 26.0–34.0)
Platelets: 309 10*3/uL (ref 150–400)
RBC: 3.74 MIL/uL — ABNORMAL LOW (ref 4.22–5.81)
RDW: 17.2 % — ABNORMAL HIGH (ref 11.5–15.5)

## 2013-08-04 NOTE — Consult Note (Signed)
Radiation Oncology         [id number]) [phone] ________________________________  Name: [name] MRN: [id number]  Date: 07/31/2013  DOB: [birth year]   HISTORY OF PRESENT ILLNESS::[name] is a 62 y.o. male who is seen for an initial consultation visit as an inpatient. He has a recent diagnosis of metastatic adenocarcinoma of unknown primary. The patient was admitted on 07/31/2013 with worsening left hip pain and rib pain. The patient's workup over the last several weeks included a chest x-ray and hip x-ray which demonstrated multifocal bony lesions including the right rib and pelvis. MRI scan of the left hip was completed on 07/31/2013. This showed numerous destructive bony lesions with the largest lesion measuring 6 x 6 x 5 cm destroying the left acetabulum. Additional lesions including the L5 vertebral body with slight improved to into the spinal canal. X-ray of the lumbar spine shows metastatic disease within the L2 and L5 bodies with a pathologic L2 superior endplate fracture with mild compression. The CT scan of the chest abdomen and pelvis has also been completed. Bilateral enhancing renal masses are present suggesting either synchronous renal cell carcinoma or metastatic disease. Lytic osseous lesions again seen in the right ribs, L2, L5, pelvis, and right femur. Mediastinal adenopathy was also present.   PREVIOUS RADIATION THERAPY: No   PAST MEDICAL HISTORY:  has a past medical history of Renal disorder; Hypertension; Dialysis patient; Secondary hyperparathyroidism (of renal origin); and Anemia due to chronic disease treated with erythropoietin.     PAST SURGICAL HISTORY: Past Surgical History  Procedure Laterality Date  . Av fistula placement       FAMILY HISTORY: family history includes Lung cancer in an other family member.   SOCIAL HISTORY:  reports that he has been smoking Cigarettes.  He has a 15 pack-year smoking history. He uses smokeless tobacco. He reports that he does  not drink alcohol.   ALLERGIES: Review of patient's allergies indicates no known allergies.   MEDICATIONS:  Current Facility-Administered Medications  Medication Dose Route Frequency Provider Last Rate Last Dose  . acetaminophen (TYLENOL) tablet 650 mg  650 mg Oral Q6H PRN Elease Etienne, MD       Or  . acetaminophen (TYLENOL) suppository 650 mg  650 mg Rectal Q6H PRN Elease Etienne, MD      . albuterol (PROVENTIL) (5 MG/ML) 0.5% nebulizer solution 2.5 mg  2.5 mg Nebulization Q2H PRN Elease Etienne, MD      . amLODipine (NORVASC) tablet 10 mg  10 mg Oral QHS Elease Etienne, MD   10 mg at 08/04/13 2034  . calcium carbonate (TUMS - dosed in mg elemental calcium) chewable tablet 600 mg of elemental calcium  3 tablet Oral Daily Elease Etienne, MD   600 mg of elemental calcium at 08/04/13 0858  . darbepoetin (ARANESP) injection 100 mcg  100 mcg Intravenous Q Thu-HD Trevor Iha, MD   100 mcg at 08/01/13 1232  . doxepin (SINEQUAN) capsule 25 mg  25 mg Oral QHS Elease Etienne, MD   25 mg at 08/04/13 2034  . ferric gluconate (NULECIT) 62.5 mg in sodium chloride 0.9 % 100 mL IVPB  62.5 mg Intravenous Q Thu-HD Sheffield Slider, PA-C   62.5 mg at 08/01/13 1348  . heparin injection 5,000 Units  5,000 Units Subcutaneous Q8H Berneta Levins, PA-C   5,000 Units at 08/04/13 1315  . HYDROcodone-acetaminophen (NORCO/VICODIN) 5-325 MG per tablet 1-2 tablet  1-2 tablet Oral Q4H PRN Theadora Rama  Irena Cords, MD   2 tablet at 08/04/13 1302  . HYDROmorphone (DILAUDID) injection 0.5 mg  0.5 mg Intravenous Q4H PRN Elease Etienne, MD   0.5 mg at 08/02/13 0805  . multivitamin (RENA-VIT) tablet 1 tablet  1 tablet Oral QHS Trevor Iha, MD   1 tablet at 08/04/13 2033  . ondansetron (ZOFRAN) tablet 4 mg  4 mg Oral Q6H PRN Elease Etienne, MD       Or  . ondansetron (ZOFRAN) injection 4 mg  4 mg Intravenous Q6H PRN Elease Etienne, MD      . polyethylene glycol (MIRALAX / GLYCOLAX) packet 17 g  17 g  Oral BID Kathlen Mody, MD   17 g at 08/04/13 2032  . senna-docusate (Senokot-S) tablet 1 tablet  1 tablet Oral BID Kathlen Mody, MD   1 tablet at 08/04/13 2033     REVIEW OF SYSTEMS:  A 15 point review of systems is documented in the electronic medical record. This was obtained by the nursing staff. However, I reviewed this with the patient to discuss relevant findings and make appropriate changes.  Pertinent items are noted in HPI.    PHYSICAL EXAM:  height is 5\' 7"  (1.702 m) and weight is 128 lb 15.5 oz (58.5 kg). His oral temperature is 98.3 F (36.8 C). His blood pressure is 141/85 and his pulse is 82. His respiration is 18 and oxygen saturation is 99%.      LABORATORY DATA:  Lab Results  Component Value Date   WBC 9.2 08/04/2013   HGB 9.7* 08/04/2013   HCT 28.3* 08/04/2013   MCV 75.7* 08/04/2013   PLT 309 08/04/2013   Lab Results  Component Value Date   NA 136 08/04/2013   K 4.1 08/04/2013   CL 96 08/04/2013   CO2 29 08/04/2013   Lab Results  Component Value Date   ALT 10 08/03/2013   AST 14 08/03/2013   ALKPHOS 86 08/03/2013   BILITOT 0.3 08/03/2013      RADIOGRAPHY: Dg Chest 2 View  07/15/2013   CLINICAL DATA:  Cough. Chest pain. Fall.  Left hip pain.  EXAM: CHEST  2 VIEW  COMPARISON:  12/24/2010  FINDINGS: Mildly enlarged cardiopericardial silhouette noted with prominence of upper zone pulmonary vasculature. There is abnormal blunting of the right costophrenic angle with adjacent airspace opacity in the right lung base. Nonspecific focal airspace opacity along the left hemidiaphragm is observed.  There is an acute or subacute right 6th rib fracture post for laterally.  IMPRESSION: 1. Small right pleural effusion with adjacent with suspected atelectasis. 2. Atelectasis or pulmonary nodule along the left hemidiaphragm. Followup chest radiography to ensure clearance, or chest CT, is recommended. 3. Mild cardiomegaly with pulmonary venous hypertension but without overt edema. 4. Acute  or subacute right posterolateral 6th rib fracture.   Electronically Signed   By: Herbie Baltimore M.D.   On: 07/15/2013 13:40   Dg Lumbar Spine 2-3 Views  07/31/2013   CLINICAL DATA:  Low back pain with recent metastatic diagnosis  EXAM: LUMBAR SPINE - 2-3 VIEW  COMPARISON:  Contemporaneously body CT.  FINDINGS: Extensive metastatic disease of the lumbar spine is under appreciated by radiography. There is a lytic lesion in the posterior L5 body and an extensive permeative lesion in the L2 body. There is a superior endplate fracture of L2 noted. Patchy sclerosis, present in the lower L2, upper L3, lower L4, and upper L5 bodies that is most likely degenerative in  nature.  IMPRESSION: 1. Metastatic disease in the L2 and L5 bodies. 2. Pathologic L2 superior endplate fracture with mild compression.   Electronically Signed   By: Tiburcio Pea M.D.   On: 07/31/2013 22:23   Dg Hip Complete Left  07/15/2013   CLINICAL DATA:  Fall. Left hip pain.  EXAM: LEFT HIP - COMPLETE 2+ VIEW  COMPARISON:  None.  FINDINGS: There is abnormal lucency in the left superior pubic ramus laterally, extending towards the acetabulum, with suspected fractures through this lucent region of bone. There is also an abnormal sclerotic lesion in the left inferior pubic ramus with some associated subtle linear lucency internally.  Calcification in the vicinity of the acetabular labrum may be from labral degeneration or a chronically fragmented spurring.  IMPRESSION: 1. Suspected lucent/lytic expansile lesion of the lateral portion of the left superior pubic ramus with underlying pathologic fractures potentially extending into the quadrilateral plate of the acetabulum. Sclerotic lesion in the left inferior pubic ramus suspicious for metastatic lesion, potentially with internal stress fracture. Correlate with patient history of malignancy. Consider whole-body bone scan bone scan and/ or MRI for further characterization and to assess the rest of  the skeleton. 2. Fragmented acetabular spurring versus labral degeneration.   Electronically Signed   By: Herbie Baltimore M.D.   On: 07/15/2013 13:46   Ct Chest W Contrast  07/31/2013   CLINICAL DATA:  Bone lesions.  Renal failure.  EXAM: CT CHEST, ABDOMEN, AND PELVIS WITH CONTRAST  TECHNIQUE: Multidetector CT imaging of the chest, abdomen and pelvis was performed following the standard protocol during bolus administration of intravenous contrast.  CONTRAST:  80mL OMNIPAQUE IOHEXOL 300 MG/ML  SOLN  COMPARISON:  07/11/2005  FINDINGS: CT CHEST FINDINGS  Hyperemic 2.3 cm right paratracheal lymph node. Smaller enlarged precarinal, subcarinal, pretracheal, and prevascular lymph nodes. Trace left small and right pleural effusions. There is a permeative lesion in the proximal right 9th rib with surrounding soft tissue and associated pathologic fracture. Smaller lytic lesion with path fracture through lateral aspect right 6th rib. Old healed left rib fractures. Small subpleural blebs in both upper lobes. There is dependent atelectasis in both lower lobes, right greater than left, with no discrete mass identified.  CT ABDOMEN AND PELVIS FINDINGS  Surgical clips in the gallbladder fossa. Unremarkable liver, spleen, adrenal glands. Innumerable cysts throughout both kidneys. 4.5 cm enhancing mass in the interpolar region of the left kidney. 3.3 cm enhancing mass in the upper pole of the right kidney. Renal veins are patent. No retroperitoneal adenopathy. Atheromatous nondilated abdominal aorta. Stomach, small bowel and colon are nondilated. Expansile lytic enhancing masses involve the superior left pubic ramus and anterior acetabulum, L2 with pathologic fracture, L5 vertebral body, posterior right iliac bone, and intertrochanteric region of the right femur. Enlarged 14 mm right inguinal lymph node. Trace pelvic ascites. No free air.  IMPRESSION: 1. Bilateral enhancing renal masses suggesting either synchronous renal cell  carcinoma or metastatic disease. 2. 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 3. Mediastinal adenopathy. 4. Pleural effusions, right greater than left. 5. Innumerable renal cysts suggesting polycystic kidney disease.   Electronically Signed   By: Oley Balm M.D.   On: 07/31/2013 22:25   Ct Abdomen Pelvis W Contrast  07/31/2013   CLINICAL DATA:  Bone lesions.  Renal failure.  EXAM: CT CHEST, ABDOMEN, AND PELVIS WITH CONTRAST  TECHNIQUE: Multidetector CT imaging of the chest, abdomen and pelvis was performed following  the standard protocol during bolus administration of intravenous contrast.  CONTRAST:  80mL OMNIPAQUE IOHEXOL 300 MG/ML  SOLN  COMPARISON:  07/11/2005  FINDINGS: CT CHEST FINDINGS  Hyperemic 2.3 cm right paratracheal lymph node. Smaller enlarged precarinal, subcarinal, pretracheal, and prevascular lymph nodes. Trace left small and right pleural effusions. There is a permeative lesion in the proximal right 9th rib with surrounding soft tissue and associated pathologic fracture. Smaller lytic lesion with path fracture through lateral aspect right 6th rib. Old healed left rib fractures. Small subpleural blebs in both upper lobes. There is dependent atelectasis in both lower lobes, right greater than left, with no discrete mass identified.  CT ABDOMEN AND PELVIS FINDINGS  Surgical clips in the gallbladder fossa. Unremarkable liver, spleen, adrenal glands. Innumerable cysts throughout both kidneys. 4.5 cm enhancing mass in the interpolar region of the left kidney. 3.3 cm enhancing mass in the upper pole of the right kidney. Renal veins are patent. No retroperitoneal adenopathy. Atheromatous nondilated abdominal aorta. Stomach, small bowel and colon are nondilated. Expansile lytic enhancing masses involve the superior left pubic ramus and anterior acetabulum, L2 with pathologic fracture, L5 vertebral body, posterior right iliac  bone, and intertrochanteric region of the right femur. Enlarged 14 mm right inguinal lymph node. Trace pelvic ascites. No free air.  IMPRESSION: 1. Bilateral enhancing renal masses suggesting either synchronous renal cell carcinoma or metastatic disease. 2. 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 3. Mediastinal adenopathy. 4. Pleural effusions, right greater than left. 5. Innumerable renal cysts suggesting polycystic kidney disease.   Electronically Signed   By: Oley Balm M.D.   On: 07/31/2013 22:25   Mr Hip Left Wo Contrast  07/31/2013   CLINICAL DATA:  Severe left hip pain. Abnormal radiographs dated 07/15/2013  EXAM: MRI OF THE LEFT HIP WITHOUT CONTRAST  TECHNIQUE: Multiplanar, multisequence MR imaging was performed. No intravenous contrast was administered.  COMPARISON:  Radiographs dated 07/15/2013  FINDINGS: There is a 6 x 6 x 5 cm inhomogeneous destructive soft tissue mass destroying the anterior medial and anterior superior aspects of the left acetabulum and the lateral aspect of the left superior pubic ramus. The tumor protrudes into the pelvis. Tumor extends into the left internal obturator muscle.  There is an expansile 2 cm mass in the posterior aspect of the L5 vertebral body slightly protruding into the spinal canal. There is a 2.5 cm destructive lesion in the posterior aspect of the left iliac bone adjacent to the superior aspect of the left sacroiliac joint. There is a 16 mm destructive lesion in the posterior lateral aspect of the proximal right femur just below the right greater trochanter. There is a 4 cm destructive lesion in the medullary canal of the proximal right femoral shaft.  The patient does have fairly severe arthritis of the superior aspect of the left femoral head with joint space narrowing and degenerative changes of the remaining portion of the left acetabulum. There small bilateral hip effusions.  There is extensive edema in the adjacent soft tissues around the left hip. The edema extends into the left side of the pelvis.  2.7 cm cyst in the right groin. This is of unknown etiology but appears benign.  IMPRESSION: 1. Numerous destructive bone lesions, with the largest lesion destroying the left acetabulum, consistent with metastatic disease. 2. 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 3. Critical Value/emergent results were called by telephone at the time of interpretation on 07/31/2013 at  1:15 PM to Dr.KELLIE GOLDSBOROUGH , who verbally acknowledged these results.   Electronically Signed   By: Geanie Cooley M.D.   On: 07/31/2013 13:25   Ir Pta Venous Right  08/03/2013   CLINICAL DATA:  62 year old with end-stage renal disease and right arm fistula. The patient has right arm swelling.  EXAM: RIGHT UPPER EXTREMITY FISTULOGRAM; CENTRAL VENOUS ANGIOPLASTY  Physician: Rachelle Hora. Lowella Dandy, MD  MEDICATIONS AND MEDICAL HISTORY: Fentanyl 50 mcg.  FLUOROSCOPY TIME:  2 min and 48 seconds  PROCEDURE: The procedure was explained to the patient. The risks and benefits of the procedure were discussed and the patient's questions were addressed. Informed consent was obtained from the patient. Right upper extremity fistula was accessed with an angiocath. Fistulogram images were obtained. Arm was prepped and draped in a sterile fashion. Maximal barrier sterile technique was utilized including caps, mask, sterile gowns, sterile gloves, sterile drape, hand hygiene and skin antiseptic. The skin was anesthetized with 1% lidocaine. Angiocath was exchanged for a 7-French vascular sheath over a Bentson wire. A 5 [place] Kumpe catheter was easily advanced into the right central veins. Additional angiography was performed. A Bentson wire was easily advanced into the superior vena cava and the inferior vena cava. A 8 mm x 40 mm Conquest balloon was positioned at the junction of the right innominate vein and SVC. The balloon was inflated  without complication. A significant waist formation was noted. Follow up angiography demonstrated improved flow in this area. This focal stenosis was treated a second time with a 10 mm x 40 mm balloon. This balloon was inflated for 2 minutes. The patient complained of some chest discomfort during this balloon inflation. Follow up angiography was performed. The wire was removed. The catheter was removed with a pursestring suture.  FINDINGS: The right upper arm fistula is patent. There was a focal critical stenosis at the junction of the right innominate vein and SVC. There were large collateral vessels in the right upper chest and neck region. The post angioplasty images demonstrated markedly improved flow through the right innominate vein. There was still some filling of the collateral vessels in the right upper chest and neck.  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 This stenosis was successfully treated with balloon angioplasty.   Electronically Signed   By: Richarda Overlie M.D.   On: 08/03/2013 12:38   Ct Biopsy  08/02/2013   CLINICAL DATA:  62 year old with renal lesions and multiple bone lesions. Tissue diagnosis is needed.  EXAM: CT BIOPSY OF LEFT PELVIC SOFT TISSUE MASS.  Physician: Rachelle Hora. Henn, MD  MEDICATIONS: Versed 2 mg, fentanyl 75 mcg. A radiology nurse monitored the patient for moderate sedation.  ANESTHESIA/SEDATION: Moderate sedation time: 17 min  PROCEDURE: The procedure was explained to the patient. The risks and benefits of the procedure were discussed and the patient's questions were addressed. Informed consent was obtained from the patient. The patient was placed supine on the CT scanner. Images through the pelvis were obtained. The lesion in the anterior left hip was identified. The left anterior pelvis was prepped and draped in a sterile fashion. Skin was anesthetized with 1% lidocaine. Using CT guidance, a 17 gauge needle was directed into  the pelvic soft tissue mass. Needle position was confirmed with CT. Three core biopsies were obtained with an 18 gauge device. Specimens were placed in formalin.  COMPLICATIONS: None  FINDINGS: There is a destructive soft tissue mass involving the anterior aspect  of the left iliac bone and the left acetabulum. Needle position was confirmed within the soft tissue lesion.  IMPRESSION: CT-guided biopsy of the left pelvic soft tissue mass.   Electronically Signed   By: Richarda Overlie M.D.   On: 08/02/2013 17:07   Dg Chest Portable 1 View  07/31/2013   CLINICAL DATA:  The shortness breath. DVT.  EXAM: PORTABLE CHEST - 1 VIEW  COMPARISON:  07/15/2013 and 12/02/2009  FINDINGS: Right base wedge-shaped consolidation. This may represent atelectasis. Pulmonary infarct or infiltrate not excluded. Recommend followup until clearance.  Elevated right hemidiaphragm. This limits evaluation of the right lung base.  Pulmonary vascular prominence most notable centrally.  Cardiomegaly.  Tortuous aorta.  The previously questioned nodule adjacent to the left hemidiaphragm is not delineated on the present exam. Recommend followup two view chest when able with attention to this region.  IMPRESSION: Right base wedge-shaped consolidation. This may represent atelectasis. Pulmonary infarct or infiltrate not excluded.  Pulmonary vascular prominence most notable centrally.  Cardiomegaly.  Tortuous aorta.  The previously questioned nodule adjacent to the left hemidiaphragm is not delineated on the present exam. Recommend followup two view chest when able with attention to this region.   Electronically Signed   By: Bridgett Larsson M.D.   On: 07/31/2013 17:52   Ir Shuntogram/ Fistulagram Right Mod Sed  08/03/2013   CLINICAL DATA:  62 year old with end-stage renal disease and right arm fistula. The patient has right arm swelling.  EXAM: RIGHT UPPER EXTREMITY FISTULOGRAM; CENTRAL VENOUS ANGIOPLASTY  Physician: Rachelle Hora. Lowella Dandy, MD  MEDICATIONS AND MEDICAL  HISTORY: Fentanyl 50 mcg.  FLUOROSCOPY TIME:  2 min and 48 seconds  PROCEDURE: The procedure was explained to the patient. The risks and benefits of the procedure were discussed and the patient's questions were addressed. Informed consent was obtained from the patient. Right upper extremity fistula was accessed with an angiocath. Fistulogram images were obtained. Arm was prepped and draped in a sterile fashion. Maximal barrier sterile technique was utilized including caps, mask, sterile gowns, sterile gloves, sterile drape, hand hygiene and skin antiseptic. The skin was anesthetized with 1% lidocaine. Angiocath was exchanged for a 7-French vascular sheath over a Bentson wire. A 5 [place] Kumpe catheter was easily advanced into the right central veins. Additional angiography was performed. A Bentson wire was easily advanced into the superior vena cava and the inferior vena cava. A 8 mm x 40 mm Conquest balloon was positioned at the junction of the right innominate vein and SVC. The balloon was inflated without complication. A significant waist formation was noted. Follow up angiography demonstrated improved flow in this area. This focal stenosis was treated a second time with a 10 mm x 40 mm balloon. This balloon was inflated for 2 minutes. The patient complained of some chest discomfort during this balloon inflation. Follow up angiography was performed. The wire was removed. The catheter was removed with a pursestring suture.  FINDINGS: The right upper arm fistula is patent. There was a focal critical stenosis at the junction of the right innominate vein and SVC. There were large collateral vessels in the right upper chest and neck region. The post angioplasty images demonstrated markedly improved flow through the right innominate vein. There was still some filling of the collateral vessels in the right upper chest and neck.  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  This stenosis was successfully treated with balloon angioplasty.   Electronically Signed   By:  Richarda Overlie M.D.   On: 08/03/2013 12:38       IMPRESSION: The patient is I believe an appropriate candidate to proceed with palliative radiotherapy. He has been evaluated by orthopedics who favors proceeding with palliative radiation at this time. In reviewing the patient's presentation, the dominate issue which needs to be addressed in terms of quality of life is the left pelvis. I also recommended to the patient that we proceed with palliative radiation to the lumbar spine. Additional candidate potential lesions include the right ribs and right femur. I would anticipate an approximate two-week course of treatment with daily radiation.   PLAN: I will schedule the patient for a simulation early next week. He is on hemodialysis. I believe that the patient can be scheduled for a simulation and brought to [facility] via [facility] early next week, likely on 08/05/2013. The patient can proceed with treatment on an outpatient basis, unless the patient will remain an inpatient for a more lengthy stay and then we will need to coordinate further inpatient care.     ________________________________   Radene Gunning, MD, PhD

## 2013-08-04 NOTE — Progress Notes (Signed)
Subjective:  No complaints, feeling well. Arm much less swollen.  Objective: Vital signs in last 24 hours: Temp:  [97.5 F (36.4 C)-98.6 F (37 C)] 98.6 F (37 C) (11/02 0527) Pulse Rate:  [74-87] 85 (11/02 0527) Resp:  [18] 18 (11/02 0527) BP: (126-152)/(74-89) 126/77 mmHg (11/02 0527) SpO2:  [95 %-98 %] 95 % (11/02 0527) Weight:  [57.7 kg (127 lb 3.3 oz)-62.6 kg (138 lb 0.1 oz)] 58.469 kg (128 lb 14.4 oz) (11/01 2138) Weight change: 2.816 kg (6 lb 3.3 oz)  Intake/Output from previous day: 11/01 0701 - 11/02 0700 In: 1080 [P.O.:1080] Out: 2580    EXAM: General appearance:  Alert, in no apparent distress Resp:  CTA without rales, rhonchi, or wheezes diminsished bs Cardio:  RRR with Gr II/VI systolic murmur, no rub GI:  + BS, soft and nontender liver down  cm Extremities:  No LE edema, R arm swelling resolved  Access:  AVF @ [facility] with + bruit Tr edema to arm  Lab Results:  Recent Labs  08/03/13 1442 08/04/13 0354  WBC 9.3 9.2  HGB 9.8* 9.7*  HCT 28.1* 28.3*  PLT 301 309   BMET:  Recent Labs  08/01/13 0951 08/03/13 1442 08/04/13 0354  NA 139 134* 136  K 4.5 4.3 4.1  CL 97 94* 96  CO2 27 26 29   GLUCOSE 145* 141* 78  BUN 51* 43* 22  CREATININE 7.70* 7.24* 4.66*  CALCIUM 10.1 9.2 8.8  ALBUMIN 3.0* 2.9*  --    No results found for this basename: PTH,  in the last 72 hours Iron Studies: No results found for this basename: IRON, TIBC, TRANSFERRIN, FERRITIN,  in the last 72 hours  Dialysis Orders: [facility] hr Optiflux 160 2K 3.5 Ca bath, Qb 450 FR A 1.5 EDW 60 (gets to edw gains 1-2 L) right upper AVF no profile, no var Na Hectorol1, Epo 1200 (just increased from 1000) venofer 50/week   Assessment/Plan: 1. Metastatic cancer - unknown primary source, s/p CT guided-core biopsy of soft tissue lesion in L pelvis 10/31, pathology pending.  2. ESRD - HD on TTS @ [place]; K 4.1.  Next HD on 11/4.  3. HTN/Volume - BP 126/77 on Amlodipine 10 mg qhs, Losartan 25 mg  qhs; wt 58.5 kg s/p net UF 2.6 L with EDW 60. Lower EDW.  Stop Losartan 4. Anemia - Hgb 9.7 on Aranesp 100 mcg & IV Fe on Thurs.  5. Sec HPT - s/p parathyroidectomy; Ca 8.8 (9.9 corrected), P 2.1; no Hectorol, Tums with meals.  6. Nutrition - Alb 2.9, renal diet, vitamin.  7. Swollen right arm - resolved s/p fistulogram with angioplasty yesterday per IR. 8.      LOS: 4 days   LYLES,CHARLES 08/04/2013,7:38 AM I have seen and examined this patient and agree with the plan of care seen , examined, eval, counseled. .  Mackenzie Lia L 08/04/2013, 9:04 AM

## 2013-08-04 NOTE — Progress Notes (Signed)
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  - Admitted to medical floor.  - CT abd and pelvis and chest revealed multiple renal masses and LAD.  - Check  SPEP to evaluate for MM.  - Oncology ocnsulted and recommendations given.   - Bedrest and nonweightbearing on left lower extremity.  - Orthopedic consulted by EDP-patient may eventually require stabilization surgery for left hip.  Pain control. Recommendations are for radiation therapy.  - IR consulted for CT guided biopsy he underwent biopsy on 10/31, radiation oncology consulted.  ESRD on dialysis  - Management per nephrology.  Hypertension  - Controlled.  Anemia  - Secondary to chronic kidney disease  - Management per nephrology.  Tobacco abuse  - Cessation counseled.   Code Status: full code Family Communication: none atbedside, tried reaching his sister. Called on her phone and home and left a message.  Disposition Plan: pending.    Consultants:  IR  ONCOLOGY  RADIATION ONCOLOGY  NEPHROLOGY  ORTHOPEDICS  Procedures:  CT guided biopsy of the soft tissue lesion in the left pelvis.   Antibiotics:  none  HPI/Subjective: Comfortable. Back pain and hip pain 5/10 . Spoke to pt in detail of the CT chest and abdomen results. He requested to talk to his sister. Spoke to the sister over the phone, no new complaints.   Objective: Filed Vitals:   08/04/13 0949  BP: 141/85  Pulse: 80  Temp: 98.4 F (36.9 C)  Resp: 20    Intake/Output Summary (Last 24 hours) at 08/04/13 1120 Last data filed at 08/04/13 0900  Gross per 24 hour  Intake    840 ml  Output   2580 ml  Net  -1740 ml   Filed Weights   08/03/13 1355 08/03/13 1721 08/03/13 2138  Weight: 62.6 kg (138 lb 0.1 oz) 57.7 kg (127 lb 3.3 oz) 58.469 kg (128 lb  14.4 oz)    Exam:   General:  Alert afebrile comfortable  Cardiovascular: s1s2  Respiratory: ctab  Abdomen: soft NT ND BS+  Musculoskeletal: lower back tenderness.  Data Reviewed: Basic Metabolic Panel:  Recent Labs Lab 07/31/13 1725 08/01/13 0951 08/03/13 1442 08/04/13 0354  NA 140 139 134* 136  K 4.8 4.5 4.3 4.1  CL 97 97 94* 96  CO2 28 27 26 29   GLUCOSE 85 145* 141* 78  BUN 42* 51* 43* 22  CREATININE 6.50* 7.70* 7.24* 4.66*  CALCIUM 10.6* 10.1 9.2 8.8  PHOS  --  3.7 2.1*  --    Liver Function Tests:  Recent Labs Lab 07/31/13 1725 08/01/13 0951 08/03/13 1442  AST 14  --  14  ALT 13  --  10  ALKPHOS 88  --  86  BILITOT 0.5  --  0.3  PROT 8.1  --  7.5  ALBUMIN 3.3* 3.0* 2.9*   No results found for this basename: LIPASE, AMYLASE,  in the last 168 hours No results found for this basename: AMMONIA,  in the last 168 hours CBC:  Recent Labs Lab 07/31/13 1725 08/01/13 0950 08/03/13 1442 08/04/13 0354  WBC 9.6 7.8 9.3 9.2  NEUTROABS 6.1  --   --   --   HGB 10.1* 10.0* 9.8* 9.7*  HCT 29.2* 28.2* 28.1* 28.3*  MCV 75.6* 74.2* 75.1* 75.7*  PLT 306 337 301 309   Cardiac Enzymes: No  results found for this basename: CKTOTAL, CKMB, CKMBINDEX, TROPONINI,  in the last 168 hours BNP (last 3 results) No results found for this basename: PROBNP,  in the last 8760 hours CBG: No results found for this basename: GLUCAP,  in the last 168 hours  No results found for this or any previous visit (from the past 240 hour(s)).   Studies: Ir Pta Venous Right  08/03/2013   CLINICAL DATA:  62 year old with end-stage renal disease and right arm fistula. The patient has right arm swelling.  EXAM: RIGHT UPPER EXTREMITY FISTULOGRAM; CENTRAL VENOUS ANGIOPLASTY  Physician: Rachelle Hora. Lowella Dandy, MD  MEDICATIONS AND MEDICAL HISTORY: Fentanyl 50 mcg.  FLUOROSCOPY TIME:  2 min and 48 seconds  PROCEDURE: The procedure was explained to the patient. The risks and benefits of the procedure were  discussed and the patient's questions were addressed. Informed consent was obtained from the patient. Right upper extremity fistula was accessed with an angiocath. Fistulogram images were obtained. Arm was prepped and draped in a sterile fashion. Maximal barrier sterile technique was utilized including caps, mask, sterile gowns, sterile gloves, sterile drape, hand hygiene and skin antiseptic. The skin was anesthetized with 1% lidocaine. Angiocath was exchanged for a 7-French vascular sheath over a Bentson wire. A 5 [place] Kumpe catheter was easily advanced into the right central veins. Additional angiography was performed. A Bentson wire was easily advanced into the superior vena cava and the inferior vena cava. A 8 mm x 40 mm Conquest balloon was positioned at the junction of the right innominate vein and SVC. The balloon was inflated without complication. A significant waist formation was noted. Follow up angiography demonstrated improved flow in this area. This focal stenosis was treated a second time with a 10 mm x 40 mm balloon. This balloon was inflated for 2 minutes. The patient complained of some chest discomfort during this balloon inflation. Follow up angiography was performed. The wire was removed. The catheter was removed with a pursestring suture.  FINDINGS: The right upper arm fistula is patent. There was a focal critical stenosis at the junction of the right innominate vein and SVC. There were large collateral vessels in the right upper chest and neck region. The post angioplasty images demonstrated markedly improved flow through the right innominate vein. There was still some filling of the collateral vessels in the right upper chest and neck.  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 This stenosis was successfully treated with balloon angioplasty.   Electronically Signed   By: Richarda Overlie M.D.   On: 08/03/2013 12:38   Ct Biopsy  08/02/2013    CLINICAL DATA:  62 year old with renal lesions and multiple bone lesions. Tissue diagnosis is needed.  EXAM: CT BIOPSY OF LEFT PELVIC SOFT TISSUE MASS.  Physician: Rachelle Hora. Henn, MD  MEDICATIONS: Versed 2 mg, fentanyl 75 mcg. A radiology nurse monitored the patient for moderate sedation.  ANESTHESIA/SEDATION: Moderate sedation time: 17 min  PROCEDURE: The procedure was explained to the patient. The risks and benefits of the procedure were discussed and the patient's questions were addressed. Informed consent was obtained from the patient. The patient was placed supine on the CT scanner. Images through the pelvis were obtained. The lesion in the anterior left hip was identified. The left anterior pelvis was prepped and draped in a sterile fashion. Skin was anesthetized with 1% lidocaine. Using CT guidance, a 17 gauge needle was directed into the pelvic soft tissue mass. Needle position  was confirmed with CT. Three core biopsies were obtained with an 18 gauge device. Specimens were placed in formalin.  COMPLICATIONS: None  FINDINGS: There is a destructive soft tissue mass involving the anterior aspect of the left iliac bone and the left acetabulum. Needle position was confirmed within the soft tissue lesion.  IMPRESSION: CT-guided biopsy of the left pelvic soft tissue mass.   Electronically Signed   By: Richarda Overlie M.D.   On: 08/02/2013 17:07   Ir Shuntogram/ Fistulagram Right Mod Sed  08/03/2013   CLINICAL DATA:  62 year old with end-stage renal disease and right arm fistula. The patient has right arm swelling.  EXAM: RIGHT UPPER EXTREMITY FISTULOGRAM; CENTRAL VENOUS ANGIOPLASTY  Physician: Rachelle Hora. Lowella Dandy, MD  MEDICATIONS AND MEDICAL HISTORY: Fentanyl 50 mcg.  FLUOROSCOPY TIME:  2 min and 48 seconds  PROCEDURE: The procedure was explained to the patient. The risks and benefits of the procedure were discussed and the patient's questions were addressed. Informed consent was obtained from the patient. Right upper  extremity fistula was accessed with an angiocath. Fistulogram images were obtained. Arm was prepped and draped in a sterile fashion. Maximal barrier sterile technique was utilized including caps, mask, sterile gowns, sterile gloves, sterile drape, hand hygiene and skin antiseptic. The skin was anesthetized with 1% lidocaine. Angiocath was exchanged for a 7-French vascular sheath over a Bentson wire. A 5 [place] Kumpe catheter was easily advanced into the right central veins. Additional angiography was performed. A Bentson wire was easily advanced into the superior vena cava and the inferior vena cava. A 8 mm x 40 mm Conquest balloon was positioned at the junction of the right innominate vein and SVC. The balloon was inflated without complication. A significant waist formation was noted. Follow up angiography demonstrated improved flow in this area. This focal stenosis was treated a second time with a 10 mm x 40 mm balloon. This balloon was inflated for 2 minutes. The patient complained of some chest discomfort during this balloon inflation. Follow up angiography was performed. The wire was removed. The catheter was removed with a pursestring suture.  FINDINGS: The right upper arm fistula is patent. There was a focal critical stenosis at the junction of the right innominate vein and SVC. There were large collateral vessels in the right upper chest and neck region. The post angioplasty images demonstrated markedly improved flow through the right innominate vein. There was still some filling of the collateral vessels in the right upper chest and neck.  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 This stenosis was successfully treated with balloon angioplasty.   Electronically Signed   By: Richarda Overlie M.D.   On: 08/03/2013 12:38    Scheduled Meds: . amLODipine  10 mg Oral QHS  . calcium carbonate  3 tablet Oral Daily  . darbepoetin (ARANESP) injection - DIALYSIS  100 mcg  Intravenous Q Thu-HD  . doxepin  25 mg Oral QHS  . ferric gluconate (FERRLECIT/NULECIT) IV  62.5 mg Intravenous Q Thu-HD  . heparin  5,000 Units Subcutaneous Q8H  . multivitamin  1 tablet Oral QHS  . polyethylene glycol  17 g Oral BID  . senna-docusate  1 tablet Oral BID   Continuous Infusions:   Principal Problem:   Pathological fracture- of left pubic rami Active Problems:   Left hip pain   Metastatic carcinoma   ESRD on hemodialysis   Anemia in chronic kidney disease   Hypertension   Tobacco abuse  Hepatitis C   Secondary hyperparathyroidism (of renal origin)    Time spent: 25 min    [facility]  [facility] Pager [phone]. If 7PM-7AM, please contact night-coverage at [email or web address], password [facility] 08/04/2013, 11:20 AM  LOS: 4 days

## 2013-08-05 ENCOUNTER — Other Ambulatory Visit: Payer: Self-pay | Admitting: Internal Medicine

## 2013-08-05 ENCOUNTER — Ambulatory Visit
Admit: 2013-08-05 | Discharge: 2013-08-05 | Disposition: A | Discharge status: Home / Self Care | Payer: Medicare Other | Attending: Radiation Oncology | Admitting: Radiation Oncology

## 2013-08-05 ENCOUNTER — Telehealth: Payer: Self-pay | Admitting: *Deleted

## 2013-08-05 DIAGNOSIS — N186 End stage renal disease: Secondary | ICD-10-CM

## 2013-08-05 DIAGNOSIS — C7951 Secondary malignant neoplasm of bone: Secondary | ICD-10-CM | POA: Insufficient documentation

## 2013-08-05 DIAGNOSIS — Z510 Encounter for antineoplastic radiation therapy: Secondary | ICD-10-CM | POA: Insufficient documentation

## 2013-08-05 DIAGNOSIS — C649 Malignant neoplasm of unspecified kidney, except renal pelvis: Secondary | ICD-10-CM | POA: Insufficient documentation

## 2013-08-05 DIAGNOSIS — D631 Anemia in chronic kidney disease: Secondary | ICD-10-CM

## 2013-08-05 DIAGNOSIS — C799 Secondary malignant neoplasm of unspecified site: Secondary | ICD-10-CM

## 2013-08-05 MED ORDER — SORBITOL 70 % SOLN
30.0000 mL | Freq: Every day | Status: DC | PRN
  Administered 2013-08-05 – 2013-08-07 (×2): 30 mL via ORAL
  Filled 2013-08-05 (×2): qty 30

## 2013-08-05 MED ORDER — MAGNESIUM CITRATE PO SOLN
1.0000 | Freq: Once | ORAL | Status: AC | PRN
  Filled 2013-08-05: qty 296

## 2013-08-05 MED ORDER — DOCUSATE SODIUM 100 MG PO CAPS
100.0000 mg | ORAL_CAPSULE | Freq: Every day | ORAL | Status: DC
  Administered 2013-08-06 – 2013-08-07 (×3): 100 mg via ORAL
  Filled 2013-08-05 (×3): qty 1

## 2013-08-05 NOTE — Progress Notes (Signed)
Orthopedics Progress Note  Subjective: My hip feels better.  Objective:  Filed Vitals:   08/05/13 0503  BP: 123/73  Pulse: 83  Temp: 98.4 F (36.9 C)  Resp: 18    General: Awake and alert  Musculoskeletal: left hip with no pain with AROM Neurovascularly intact  Lab Results  Component Value Date   WBC 9.2 08/04/2013   HGB 9.7* 08/04/2013   HCT 28.3* 08/04/2013   MCV 75.7* 08/04/2013   PLT 309 08/04/2013       Component Value Date/Time   NA 136 08/04/2013 0354   K 4.1 08/04/2013 0354   CL 96 08/04/2013 0354   CO2 29 08/04/2013 0354   GLUCOSE 78 08/04/2013 0354   BUN 22 08/04/2013 0354   CREATININE 4.66* 08/04/2013 0354   CALCIUM 8.8 08/04/2013 0354   GFRNONAA 12* 08/04/2013 0354   GFRAA 14* 08/04/2013 0354    Lab Results  Component Value Date   INR 1.10 08/02/2013   INR 0.99 11/19/2009    Assessment/Plan: 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  Currently undergoing radiation therapy for local control. This has definitely helped his pain.  There is no reconstructive option available due to the extensive nature of his disease that would compromise his hip should resection of the lesions be attempted.  Plan is to continue with radiation therapy which may allow for some bony reconstitution of the affected anterior aspect of the acetabulum and pelvis.  Continue NWB due to the compromised nature of the dome and anterior wall of the acetabulum and also the proximal femur.   Almedia Balls. Ranell Patrick, MD 08/05/2013 2:35 PM

## 2013-08-05 NOTE — Telephone Encounter (Signed)
Called 6E02-C [facility] spoke with patient's nurse Ashle, [facility]ology dept at 10:00am,  Asked nurse to please medicate patient for pain before coming via [facility] transporation, sje will need to call them and have him here then, nurse stated she would Thanked her, called [facility],spoke with Lenore,therapist gave status 8:18 AM

## 2013-08-05 NOTE — Progress Notes (Addendum)
[facility] PROGRESS NOTE  [name] ZOX:[id number] DOB: 1951/07/06 DOA: 07/31/2013 PCP: Dyke Maes, MD  Assessment/Plan: Left hip pain/metastatic disease to left pubic rami & acetabulum  - Etiology of primary unknown.  - Admitted to medical floor.  - CT abd and pelvis and chest revealed multiple renal masses and LAD.  - Check  SPEP to evaluate for MM.  - Oncology ocnsulted and recommendations given.   - Bedrest and nonweightbearing on left lower extremity.  - Orthopedic consulted by EDP-patient may eventually require stabilization surgery for left hip.  Pain control. Recommendations are for radiation therapy.  - IR consulted for CT guided biopsy he underwent biopsy on 10/31, radiation oncology consulted and he is undergoing radiation to the hip for a total of 10 treatments. After which he will follow up with oncology as outpatient.  ESRD on dialysis  - Management per nephrology.  Hypertension  - Controlled.  Anemia  - Secondary to chronic kidney disease  - Management per nephrology.  Tobacco abuse  - Cessation counseled.   Code Status: full code Family Communication: none atbedside, tried reaching his sister. Called on her phone and home and left a message.  Disposition Plan: pending.    Consultants:  IR  ONCOLOGY  RADIATION ONCOLOGY  NEPHROLOGY  ORTHOPEDICS  Procedures:  CT guided biopsy of the soft tissue lesion in the left pelvis.   Antibiotics:  none  HPI/Subjective: Comfortable. Back pain and hip pain 5/10 . Spoke to pt in detail of the CT chest and abdomen results. He requested to talk to his sister. Spoke to the sister over the phone, no new complaints.   Objective: Filed Vitals:   08/05/13 1400  BP: 138/77  Pulse: 89  Temp: 98.5 F (36.9 C)  Resp: 18    Intake/Output Summary (Last 24 hours) at 08/05/13 1914 Last data filed at 08/05/13 1356  Gross per 24 hour  Intake    480 ml  Output    200 ml  Net    280 ml   Filed  Weights   08/03/13 1721 08/03/13 2138 08/04/13 2018  Weight: 57.7 kg (127 lb 3.3 oz) 58.469 kg (128 lb 14.4 oz) 58.5 kg (128 lb 15.5 oz)    Exam:   General:  Alert afebrile comfortable  Cardiovascular: s1s2  Respiratory: ctab  Abdomen: soft NT ND BS+  Musculoskeletal: lower back tenderness.  Data Reviewed: Basic Metabolic Panel:  Recent Labs Lab 07/31/13 1725 08/01/13 0951 08/03/13 1442 08/04/13 0354  NA 140 139 134* 136  K 4.8 4.5 4.3 4.1  CL 97 97 94* 96  CO2 28 27 26 29   GLUCOSE 85 145* 141* 78  BUN 42* 51* 43* 22  CREATININE 6.50* 7.70* 7.24* 4.66*  CALCIUM 10.6* 10.1 9.2 8.8  PHOS  --  3.7 2.1*  --    Liver Function Tests:  Recent Labs Lab 07/31/13 1725 08/01/13 0951 08/03/13 1442  AST 14  --  14  ALT 13  --  10  ALKPHOS 88  --  86  BILITOT 0.5  --  0.3  PROT 8.1  --  7.5  ALBUMIN 3.3* 3.0* 2.9*   No results found for this basename: LIPASE, AMYLASE,  in the last 168 hours No results found for this basename: AMMONIA,  in the last 168 hours CBC:  Recent Labs Lab 07/31/13 1725 08/01/13 0950 08/03/13 1442 08/04/13 0354  WBC 9.6 7.8 9.3 9.2  NEUTROABS 6.1  --   --   --   HGB  10.1* 10.0* 9.8* 9.7*  HCT 29.2* 28.2* 28.1* 28.3*  MCV 75.6* 74.2* 75.1* 75.7*  PLT 306 337 301 309   Cardiac Enzymes: No results found for this basename: CKTOTAL, CKMB, CKMBINDEX, TROPONINI,  in the last 168 hours BNP (last 3 results) No results found for this basename: PROBNP,  in the last 8760 hours CBG: No results found for this basename: GLUCAP,  in the last 168 hours  No results found for this or any previous visit (from the past 240 hour(s)).   Studies: No results found.  Scheduled Meds: . amLODipine  10 mg Oral QHS  . calcium carbonate  3 tablet Oral Daily  . darbepoetin (ARANESP) injection - DIALYSIS  100 mcg Intravenous Q Thu-HD  . docusate sodium  100 mg Oral QHS  . doxepin  25 mg Oral QHS  . ferric gluconate (FERRLECIT/NULECIT) IV  62.5 mg  Intravenous Q Thu-HD  . heparin  5,000 Units Subcutaneous Q8H  . multivitamin  1 tablet Oral QHS  . polyethylene glycol  17 g Oral BID  . senna-docusate  1 tablet Oral BID   Continuous Infusions:   Principal Problem:   Pathological fracture- of left pubic rami Active Problems:   Left hip pain   Metastatic carcinoma   ESRD on hemodialysis   Anemia in chronic kidney disease   Hypertension   Tobacco abuse   Hepatitis C   Secondary hyperparathyroidism (of renal origin)    Time spent: 25 min    Ambriella Kitt  [facility] Pager [phone]. If 7PM-7AM, please contact night-coverage at [email or web address], password [facility] 08/05/2013, 7:14 PM  LOS: 5 days

## 2013-08-05 NOTE — Telephone Encounter (Signed)
caleld and spoke with RN Morrie Sheldon that [facility] here to bring pateint back, per Lenore,therapist patient cried the whole time in CT", I spoke with the patient and let him know his transportation wpuld be here shortly , he just nodded yes,eyes closed, non verbal, patient to get 10 txs 11:24 AM

## 2013-08-05 NOTE — Progress Notes (Signed)
Subjective:  "going for Radiation today '' co constipation  Objective Vital signs in last 24 hours: Filed Vitals:   08/04/13 1400 08/04/13 1754 08/04/13 2018 08/05/13 0503  BP: 138/80 141/78 141/85 123/73  Pulse: 78 81 82 83  Temp: 97.6 F (36.4 C) 98 F (36.7 C) 98.3 F (36.8 C) 98.4 F (36.9 C)  TempSrc: Oral Oral Oral Oral  Resp: 20 18 18 18   Height:   5\' 7"  (1.702 m)   Weight:   58.5 kg (128 lb 15.5 oz)   SpO2: 97% 98% 99% 95%   Weight change: -4.1 kg (-9 lb 0.6 oz)  Intake/Output Summary (Last 24 hours) at 08/05/13 0856 Last data filed at 08/05/13 0042  Gross per 24 hour  Intake    960 ml  Output    200 ml  Net    760 ml   Labs: Basic Metabolic Panel:  Recent Labs Lab 07/31/13 1725 08/01/13 0951 08/03/13 1442 08/04/13 0354  NA 140 139 134* 136  K 4.8 4.5 4.3 4.1  CL 97 97 94* 96  CO2 28 27 26 29   GLUCOSE 85 145* 141* 78  BUN 42* 51* 43* 22  CREATININE 6.50* 7.70* 7.24* 4.66*  CALCIUM 10.6* 10.1 9.2 8.8  PHOS  --  3.7 2.1*  --    Liver Function Tests:  Recent Labs Lab 07/31/13 1725 08/01/13 0951 08/03/13 1442  AST 14  --  14  ALT 13  --  10  ALKPHOS 88  --  86  BILITOT 0.5  --  0.3  PROT 8.1  --  7.5  ALBUMIN 3.3* 3.0* 2.9*  CBC:  Recent Labs Lab 07/31/13 1725 08/01/13 0950 08/03/13 1442 08/04/13 0354  WBC 9.6 7.8 9.3 9.2  NEUTROABS 6.1  --   --   --   HGB 10.1* 10.0* 9.8* 9.7*  HCT 29.2* 28.2* 28.1* 28.3*  MCV 75.6* 74.2* 75.1* 75.7*  PLT 306 337 301 309   Studies/Results: Ir Pta Venous Right  08/03/2013   CLINICAL DATA:  62 year old with end-stage renal disease and right arm fistula. The patient has right arm swelling.  EXAM: RIGHT UPPER EXTREMITY FISTULOGRAM; CENTRAL VENOUS ANGIOPLASTY  Physician: Rachelle Hora. Lowella Dandy, MD  MEDICATIONS AND MEDICAL HISTORY: Fentanyl 50 mcg.  FLUOROSCOPY TIME:  2 min and 48 seconds  PROCEDURE: The procedure was explained to the patient. The risks and benefits of the procedure were discussed and the patient's  questions were addressed. Informed consent was obtained from the patient. Right upper extremity fistula was accessed with an angiocath. Fistulogram images were obtained. Arm was prepped and draped in a sterile fashion. Maximal barrier sterile technique was utilized including caps, mask, sterile gowns, sterile gloves, sterile drape, hand hygiene and skin antiseptic. The skin was anesthetized with 1% lidocaine. Angiocath was exchanged for a 7-French vascular sheath over a Bentson wire. A 5 [place] Kumpe catheter was easily advanced into the right central veins. Additional angiography was performed. A Bentson wire was easily advanced into the superior vena cava and the inferior vena cava. A 8 mm x 40 mm Conquest balloon was positioned at the junction of the right innominate vein and SVC. The balloon was inflated without complication. A significant waist formation was noted. Follow up angiography demonstrated improved flow in this area. This focal stenosis was treated a second time with a 10 mm x 40 mm balloon. This balloon was inflated for 2 minutes. The patient complained of some chest discomfort during this balloon inflation. Follow up angiography  was performed. The wire was removed. The catheter was removed with a pursestring suture.  FINDINGS: The right upper arm fistula is patent. There was a focal critical stenosis at the junction of the right innominate vein and SVC. There were large collateral vessels in the right upper chest and neck region. The post angioplasty images demonstrated markedly improved flow through the right innominate vein. There was still some filling of the collateral vessels in the right upper chest and neck.  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 This stenosis was successfully treated with balloon angioplasty.   Electronically Signed   By: Richarda Overlie M.D.   On: 08/03/2013 12:38   Ir Shuntogram/ Fistulagram Right Mod Sed  08/03/2013    CLINICAL DATA:  62 year old with end-stage renal disease and right arm fistula. The patient has right arm swelling.  EXAM: RIGHT UPPER EXTREMITY FISTULOGRAM; CENTRAL VENOUS ANGIOPLASTY  Physician: Rachelle Hora. Lowella Dandy, MD  MEDICATIONS AND MEDICAL HISTORY: Fentanyl 50 mcg.  FLUOROSCOPY TIME:  2 min and 48 seconds  PROCEDURE: The procedure was explained to the patient. The risks and benefits of the procedure were discussed and the patient's questions were addressed. Informed consent was obtained from the patient. Right upper extremity fistula was accessed with an angiocath. Fistulogram images were obtained. Arm was prepped and draped in a sterile fashion. Maximal barrier sterile technique was utilized including caps, mask, sterile gowns, sterile gloves, sterile drape, hand hygiene and skin antiseptic. The skin was anesthetized with 1% lidocaine. Angiocath was exchanged for a 7-French vascular sheath over a Bentson wire. A 5 [place] Kumpe catheter was easily advanced into the right central veins. Additional angiography was performed. A Bentson wire was easily advanced into the superior vena cava and the inferior vena cava. A 8 mm x 40 mm Conquest balloon was positioned at the junction of the right innominate vein and SVC. The balloon was inflated without complication. A significant waist formation was noted. Follow up angiography demonstrated improved flow in this area. This focal stenosis was treated a second time with a 10 mm x 40 mm balloon. This balloon was inflated for 2 minutes. The patient complained of some chest discomfort during this balloon inflation. Follow up angiography was performed. The wire was removed. The catheter was removed with a pursestring suture.  FINDINGS: The right upper arm fistula is patent. There was a focal critical stenosis at the junction of the right innominate vein and SVC. There were large collateral vessels in the right upper chest and neck region. The post angioplasty images demonstrated  markedly improved flow through the right innominate vein. There was still some filling of the collateral vessels in the right upper chest and neck.  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 This stenosis was successfully treated with balloon angioplasty.   Electronically Signed   By: Richarda Overlie M.D.   On: 08/03/2013 12:38   Medications:   . amLODipine  10 mg Oral QHS  . calcium carbonate  3 tablet Oral Daily  . darbepoetin (ARANESP) injection - DIALYSIS  100 mcg Intravenous Q Thu-HD  . doxepin  25 mg Oral QHS  . ferric gluconate (FERRLECIT/NULECIT) IV  62.5 mg Intravenous Q Thu-HD  . heparin  5,000 Units Subcutaneous Q8H  . multivitamin  1 tablet Oral QHS  . polyethylene glycol  17 g Oral BID  . senna-docusate  1 tablet Oral BID   Physical Exam: General: alert ,  thin BM , NAD Heart: RRR / 2/6 sem lsb . No rub  Lungs: decr at bilat  Bases, no rales or rhonchi  Abdomen: BS pos.  , soft, nontender Extremities: Dialysis Access: No pedal edema/ R UA AVF pos. Bruit with IR suture in place AVF   Dialysis Orders: [facility] Optiflux 160 2K 3.5 Ca bath, Qb 450 FR A 1.5 EDW 60 (gets to edw gains 1-2 L) right upper AVF no profile, no var Na Hectorol1, Epo 1200 (just increased from 1000) venofer 50/week   Assessment/Plan:  1. Metastatic cancer - unknown primary source, Pending Path  From CT guided-core biopsy of soft tissue lesion in L pelvis 10/31, Radiation Onc. Following and Scheduling Rad Therapy daily for note 2 weeks / Before dc contact  Family (sister Ms Hyman Hopes [phone]) helps with Transportation sometimes. 2. ESRD - HD on TTS @ [place];  Next HD in am / NEEDS to be able to SIT IN RECLINER  At HD before dc home. Attempt in am HD 3. HTN/Volume - BP 123/73on Amlodipine 10 mg qhs,( Losartan 25 mg qhs DC yesterday) wt 58.5 kg s/p net UF 2.6 L Sat HD with EDW 60.will need lower edw 4. Anemia - Hgb 9.7 on Aranesp 100 mcg & IV Fe on Thurs.   5. Sec HPT - s/p parathyroidectomy; Ca 8.8 (9.9 corrected), P 2.1; no Hectorol,No binders 6. Nutrition - Alb 2.9, renal diet, vitamin.  7. Swollen right arm - resolved s/p 08/03/13 fistulogram with angioplasty per IR. 8. Obstipation -colace . Mira lax, prn Sorbital  Lenny Pastel, PA-C [facility] Beeper [phone] 08/05/2013,8:56 AM  LOS: 5 days   I have seen and examined patient, discussed with PA and agree with assessment and plan as outlined above. Vinson Moselle MD pager [phone]    cell [phone] 08/06/2013, 12:58 PM

## 2013-08-05 NOTE — Telephone Encounter (Signed)
Called [facility], spoke with dispatcher to send [facility] to come pick up patient to go back to [facility], "We will send them shortly" 10:39 AM

## 2013-08-06 ENCOUNTER — Other Ambulatory Visit: Payer: Self-pay | Admitting: Internal Medicine

## 2013-08-06 DIAGNOSIS — C799 Secondary malignant neoplasm of unspecified site: Secondary | ICD-10-CM

## 2013-08-06 LAB — RENAL FUNCTION PANEL
BUN: 57 mg/dL — ABNORMAL HIGH (ref 6–23)
CO2: 25 mEq/L (ref 19–32)
Chloride: 93 mEq/L — ABNORMAL LOW (ref 96–112)
GFR calc Af Amer: 7 mL/min — ABNORMAL LOW (ref >90)
Glucose, Bld: 121 mg/dL — ABNORMAL HIGH (ref 70–99)
Potassium: 4.8 mEq/L (ref 3.5–5.1)
Sodium: 136 mEq/L (ref 135–145)

## 2013-08-06 LAB — BASIC METABOLIC PANEL
Calcium: 9.2 mg/dL (ref 8.4–10.5)
Chloride: 93 mEq/L — ABNORMAL LOW (ref 96–112)
GFR calc Af Amer: 7 mL/min — ABNORMAL LOW (ref >90)
GFR calc non Af Amer: 6 mL/min — ABNORMAL LOW (ref >90)
Glucose, Bld: 127 mg/dL — ABNORMAL HIGH (ref 70–99)
Potassium: 4.7 mEq/L (ref 3.5–5.1)
Sodium: 136 mEq/L (ref 135–145)

## 2013-08-06 LAB — CBC
HCT: 28.2 % — ABNORMAL LOW (ref 39.0–52.0)
Hemoglobin: 9.8 g/dL — ABNORMAL LOW (ref 13.0–17.0)
MCH: 25.8 pg — ABNORMAL LOW (ref 26.0–34.0)
Platelets: 339 10*3/uL (ref 150–400)
RBC: 3.8 MIL/uL — ABNORMAL LOW (ref 4.22–5.81)

## 2013-08-06 LAB — PHOSPHORUS: Phosphorus: 3.2 mg/dL (ref 2.3–4.6)

## 2013-08-06 MED ORDER — HYDROCODONE-ACETAMINOPHEN 5-325 MG PO TABS
ORAL_TABLET | ORAL | Status: AC
  Filled 2013-08-06: qty 2

## 2013-08-06 NOTE — Progress Notes (Addendum)
[facility] PROGRESS NOTE  [name] ZOX:[id number] DOB: 04/23/1951 DOA: 07/31/2013 PCP: Dyke Maes, MD Interim History: [name] is a 62 y.o. male with history of ESRD on TTS hemodialysis, hypertension, ongoing tobacco abuse, presented to the ED on his nephrologist advise for evaluation of worsening left hip pain. He was sent for chest x-ray and hip x-ray 2 weeks ago that showed small right pleural effusion, atelectasis, atelectasis or pulmonary nodule along left hemidiaphragm and acute or subacute posterior lateral sixth rib fracture & suspected loosening/lytic expansile lesion of the lateral portion of the left superior pubic rami with underlying pathological fractures potentially extending into the quadrilateral plate of the acetabulum, sclerotic lesion in the left inferior pubic ramus suspicious for metastatic lesion. MRI today shows numerous district or bone lesions with the largest lesion destroying the left acetabulum consistent with metastatic disease. Hospitalist admission requested. EDP has consulted nephrology and orthopedics. He underwent soft tissue biopsy and results show metastatic renal cell carcinoma. Oncology consulted and recommended outpatient follow up. Orthopedics recommended  radiation to the hip for pain, no surgical intervention because of the extensive nature of the disease. Recommendations are to continue with NWB due to the compromised done and anterior wall of the acetabulum and proximal femur. Radiation oncology consulted and he is on day 2 of 10 total treatments. Today he will undergo HD in recliner and XRT to the hip and PT eval ordered for disposition.    Assessment/Plan: Left hip pain/metastatic disease to left pubic rami & acetabulum  - secondary to metastatic renal cell carcinoma.  - Admitted to medical floor.  - CT abd and pelvis and chest revealed multiple renal masses and LAD.  - Oncology ocnsulted and recommendations given.   - Bedrest  and nonweightbearing on left lower extremity.  - Orthopedic consulted by EDP-  Pain control. Recommendations are for radiation therapy. NWB of the hip - currently undergoing xrt to the hip at [facility] . ESRD on dialysis  - Management per nephrology.  Hypertension  - Controlled.  Anemia  - Secondary to chronic kidney disease  - Management per nephrology.  Tobacco abuse  - Cessation counseled.   Code Status: full code Family Communication: none atbedside, called sister's phone and left a message.  Disposition Plan: pending.    Consultants:  IR  ONCOLOGY  RADIATION ONCOLOGY  NEPHROLOGY  ORTHOPEDICS  Procedures:  CT guided biopsy of the soft tissue lesion in the left pelvis.   Antibiotics:  none  HPI/Subjective: Comfortable. Back pain and hip pain 5/10 . PT eval pending.   Objective: Filed Vitals:   08/06/13 0453  BP: 113/64  Pulse: 76  Temp: 98.4 F (36.9 C)  Resp: 18    Intake/Output Summary (Last 24 hours) at 08/06/13 0939 Last data filed at 08/05/13 2200  Gross per 24 hour  Intake    480 ml  Output      0 ml  Net    480 ml   Filed Weights   08/03/13 2138 08/04/13 2018 08/05/13 2039  Weight: 58.469 kg (128 lb 14.4 oz) 58.5 kg (128 lb 15.5 oz) 58.5 kg (128 lb 15.5 oz)    Exam:   General:  Alert afebrile comfortable  Cardiovascular: s1s2  Respiratory: ctab  Abdomen: soft NT ND BS+  Musculoskeletal: lower back tenderness.  Data Reviewed: Basic Metabolic Panel:  Recent Labs Lab 07/31/13 1725 08/01/13 0951 08/03/13 1442 08/04/13 0354  NA 140 139 134* 136  K 4.8 4.5 4.3 4.1  CL 97 97 94*  96  CO2 28 27 26 29   GLUCOSE 85 145* 141* 78  BUN 42* 51* 43* 22  CREATININE 6.50* 7.70* 7.24* 4.66*  CALCIUM 10.6* 10.1 9.2 8.8  PHOS  --  3.7 2.1*  --    Liver Function Tests:  Recent Labs Lab 07/31/13 1725 08/01/13 0951 08/03/13 1442  AST 14  --  14  ALT 13  --  10  ALKPHOS 88  --  86  BILITOT 0.5  --  0.3  PROT 8.1  --  7.5  ALBUMIN  3.3* 3.0* 2.9*   No results found for this basename: LIPASE, AMYLASE,  in the last 168 hours No results found for this basename: AMMONIA,  in the last 168 hours CBC:  Recent Labs Lab 07/31/13 1725 08/01/13 0950 08/03/13 1442 08/04/13 0354  WBC 9.6 7.8 9.3 9.2  NEUTROABS 6.1  --   --   --   HGB 10.1* 10.0* 9.8* 9.7*  HCT 29.2* 28.2* 28.1* 28.3*  MCV 75.6* 74.2* 75.1* 75.7*  PLT 306 337 301 309   Cardiac Enzymes: No results found for this basename: CKTOTAL, CKMB, CKMBINDEX, TROPONINI,  in the last 168 hours BNP (last 3 results) No results found for this basename: PROBNP,  in the last 8760 hours CBG: No results found for this basename: GLUCAP,  in the last 168 hours  No results found for this or any previous visit (from the past 240 hour(s)).   Studies: No results found.  Scheduled Meds: . amLODipine  10 mg Oral QHS  . calcium carbonate  3 tablet Oral Daily  . darbepoetin (ARANESP) injection - DIALYSIS  100 mcg Intravenous Q Thu-HD  . docusate sodium  100 mg Oral QHS  . doxepin  25 mg Oral QHS  . ferric gluconate (FERRLECIT/NULECIT) IV  62.5 mg Intravenous Q Thu-HD  . heparin  5,000 Units Subcutaneous Q8H  . multivitamin  1 tablet Oral QHS  . polyethylene glycol  17 g Oral BID  . senna-docusate  1 tablet Oral BID   Continuous Infusions:   Principal Problem:   Pathological fracture- of left pubic rami Active Problems:   Left hip pain   Metastatic carcinoma   ESRD on hemodialysis   Anemia in chronic kidney disease   Hypertension   Tobacco abuse   Hepatitis C   Secondary hyperparathyroidism (of renal origin)    Time spent: 25 min    Avital Dancy  [facility] Pager [phone]. If 7PM-7AM, please contact night-coverage at [email or web address], password [facility] 08/06/2013, 9:39 AM  LOS: 6 days

## 2013-08-06 NOTE — Progress Notes (Signed)
Subjective:  On hd in recliner no cos curremtly Objective Vital signs in last 24 hours: Filed Vitals:   08/05/13 0503 08/05/13 1400 08/05/13 2039 08/06/13 0453  BP: 123/73 138/77 133/74 113/64  Pulse: 83 89 84 76  Temp: 98.4 F (36.9 C) 98.5 F (36.9 C) 98.3 F (36.8 C) 98.4 F (36.9 C)  TempSrc: Oral Oral Oral Oral  Resp: 18 18 18 18   Height:   5\' 7"  (1.702 m)   Weight:   58.5 kg (128 lb 15.5 oz)   SpO2: 95% 94% 100% 96%   Weight change: -0 kg (-0 oz)  Intake/Output Summary (Last 24 hours) at 08/06/13 1043 Last data filed at 08/05/13 2200  Gross per 24 hour  Intake    480 ml  Output      0 ml  Net    480 ml   Labs: Basic Metabolic Panel:  Recent Labs Lab 07/31/13 1725 08/01/13 0951 08/03/13 1442 08/04/13 0354  NA 140 139 134* 136  K 4.8 4.5 4.3 4.1  CL 97 97 94* 96  CO2 28 27 26 29   GLUCOSE 85 145* 141* 78  BUN 42* 51* 43* 22  CREATININE 6.50* 7.70* 7.24* 4.66*  CALCIUM 10.6* 10.1 9.2 8.8  PHOS  --  3.7 2.1*  --    Liver Function Tests:  Recent Labs Lab 07/31/13 1725 08/01/13 0951 08/03/13 1442  AST 14  --  14  ALT 13  --  10  ALKPHOS 88  --  86  BILITOT 0.5  --  0.3  PROT 8.1  --  7.5  ALBUMIN 3.3* 3.0* 2.9*    Recent Labs Lab 07/31/13 1725 08/01/13 0950 08/03/13 1442 08/04/13 0354  WBC 9.6 7.8 9.3 9.2  NEUTROABS 6.1  --   --   --   HGB 10.1* 10.0* 9.8* 9.7*  HCT 29.2* 28.2* 28.1* 28.3*  MCV 75.6* 74.2* 75.1* 75.7*  PLT 306 337 301 309   Studies/Results: No results found. Medications:   . amLODipine  10 mg Oral QHS  . calcium carbonate  3 tablet Oral Daily  . darbepoetin (ARANESP) injection - DIALYSIS  100 mcg Intravenous Q Thu-HD  . docusate sodium  100 mg Oral QHS  . doxepin  25 mg Oral QHS  . ferric gluconate (FERRLECIT/NULECIT) IV  62.5 mg Intravenous Q Thu-HD  . heparin  5,000 Units Subcutaneous Q8H  . multivitamin  1 tablet Oral QHS  . polyethylene glycol  17 g Oral BID  . senna-docusate  1 tablet Oral BID   Physical  Exam:  General: alert , thin BM , NAD  Heart: RRR / 2/6 sem lsb . No rub  Lungs: decr at bilat Bases, no rales or rhonchi  Abdomen: BS pos. , soft, nontender  Extremities: Dialysis Access: No pedal edema/ R UA AVF patent on hd  Dialysis Orde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  rs: [facility] hr Optiflux 160 2K 3.5 Ca bath, Qb 450 FR A 1.5 EDW 60 (gets to edw gains 1-2 L) right upper AVF no profile, no var Na Hectorol1, Epo 1200 (just increased from 1000) venofer 50/week   Assessment/Plan:  1. Left Hip Metastatic lesions of the acetabulum and proximal Femur - unknown primary source, Path From CT guided-core biopsy of soft tissue lesion in L pelvis 10/31,Oncology seeing and Radiation Onc. Following and Scheduling Rad Therapy daily for 2 weeks /  LIVES ALONE/Before dc contact Family (sister Ms Hyman Hopes [phone]) helps with  Transportation sometimes.? ? 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 ESRD - HD on TTS @ [place]; Next HD in am /. Attempting recliner hd now/ labs pending 3.   HTN/Volume - BP 113/64 on Amlodipine 10 mg qhs,( Losartan 25 mg qhs DC )  Pre hd wt = 58.5 kg and with          EDW 60.will need lower edw  4    Anemia - Hgb 9.7 on Aranesp 100 mcg & IV Fe on Thurs. / pending labs pre hd 5   Sec HPT - s/p parathyroidectomy; Ca 8.8 (9.9 corrected), P 2.1; no Hectorol,No binders/ labs pre  Hd pending 6   Nutrition - Alb 2.9, renal diet, vitamin.  7   Swollen right arm - resolved s/p 08/03/13 fistulogram with angioplasty per IR. 8.  Obstipation -colace . Mira lax, prn Sorbital   Lenny Pastel, PA-C [facility] Beeper [phone] 08/06/2013,10:43 AM  LOS: 6 days   I have seen and examined patient, discussed with PA and agree with  assessment and plan as outlined above with additions as indicated. Vinson Moselle MD pager [phone]    cell [phone] 08/06/2013, 1:34 PM

## 2013-08-06 NOTE — Progress Notes (Signed)
Medical Oncology  Pathology reported on 08/05/2013 soft tissue needle core biopsy collected on 08/02/2013 from left anterior pelvis, acetabula is consistent with metastatic renal cell carcinoma, clear cell type.  We will facilitate follow up with me following completion of his palliative XRT.  We will discuss with the patient today.

## 2013-08-07 DIAGNOSIS — IMO0002 Reserved for concepts with insufficient information to code with codable children: Secondary | ICD-10-CM

## 2013-08-07 NOTE — Plan of Care (Signed)
Problem: Acute Rehab PT Goals(only PT should resolve) Goal: Pt Will Ambulate While maintaining NWB status on Lt LE.

## 2013-08-07 NOTE — Clinical Social Work Placement (Addendum)
Clinical Social Work Department CLINICAL SOCIAL WORK PLACEMENT NOTE 08/07/2013  Patient:  [name], [name]  Account Number:  [id number] Admit date:  07/31/2013  Clinical Social Worker:  Genelle Bal, LCSW  Date/time:  08/07/2013 04:21 AM  Clinical Social Work is seeking post-discharge placement for this patient at the following level of care:   SKILLED NURSING   (*CSW will update this form in Epic as items are completed)   08/07/2013  Patient/family provided with [facility] Department of Clinical Social Work's list of facilities offering this level of care within the geographic area requested by the patient (or if unable, by the patient's family).  08/07/2013  Patient/family informed of their freedom to choose among providers that offer the needed level of care, that participate in Medicare, Medicaid or managed care program needed by the patient, have an available bed and are willing to accept the patient.    Patient/family informed of [facility]' ownership interest in [facility], as well as of the fact that they are under no obligation to receive care at this facility.  PASARR submitted to EDS on 08/07/2013 PASARR number received from EDS on 08/07/2013  FL2 transmitted to all facilities in geographic area requested by pt/family on  08/07/2013 FL2 transmitted to all facilities within larger geographic area on   Patient informed that his/her managed care company has contracts with or will negotiate with  certain facilities, including the following:     Patient/family informed of bed offers received: 08/08/2013. Patient chooses bed at [facility]. Physician recommends and patient chooses bed at    Patient to be transferred to [facility] on  08/08/2013 Patient to be transferred to facility by ambulance.  The following physician request were entered in Epic:   Additional Comments: 08/08/2013 - CSW intern called patient's sister, Ms. Hyman Hopes to  inform her that the patient would be discharging today to [facility].    Deniece Ree, CSW Intern.

## 2013-08-07 NOTE — Progress Notes (Signed)
[facility] PROGRESS NOTE  [name] ZOX:[id number] DOB: 1950/10/30 DOA: 07/31/2013 PCP: Dyke Maes, MD  Brief narrative 62 y/o male with ESRD on hemodialysis (Tu , Th , Sat), attention, ongoing tobacco use presented to the ED for worsening left hip pain. An x-ray of his hip 2 weeks ago showed  A lytic lesion over left hip with pathological fracture. MRI on admission showed numerous destructive bone lesions with the largest lesion destroying the left acetabulum consistent with metastatic disease. Marland Kitchen He underwent soft tissue biopsy and results show metastatic renal cell carcinoma. Oncology consulted and recommended outpatient follow up. Orthopedics recommended radiation to the hip for pain, no surgical intervention because of the extensive nature of the disease. Recommendations are to continue with NWB due to the compromised done and anterior wall of the acetabulum and proximal femur. Radiation oncology consulted and he is on total  10  treatments.   Assessment/Plan: Left hip pain/metastatic disease to left pubic rami & acetabulum  - secondary to metastatic renal cell carcinoma.  - CT abd and pelvis and chest revealed multiple renal masses and lymphadenoapthy - appreciate oncology recommendation. follow up as outpatient - Bedrest and nonweightbearing on left lower extremity. Pain control.  Appreciate ortho consult.  Recommended for radiation therapy and NWB of the left hip . Patient using walker for ambulation. - currently undergoing xrt to the hip at [name] .   ESRD on dialysis  -per nephrology.   Hypertension  - stable. Continue amlodipine  Anemia  - Secondary to chronic kidney disease  - Management per renal  Tobacco abuse  - counseled on cessation  Code Status: full code  Family Communication: none at bedside, Disposition Plan: skilled Nursing facility when  bed available  Consultants:  IR  ONCOLOGY  RADIATION ONCOLOGY  NEPHROLOGY  ORTHOPEDICS  Procedures:   CT guided biopsy of the soft tissue lesion in the left pelvis.   Antibiotics:  None  HPI/Subjective:  Complains of pain on left hip movement  Objective: Filed Vitals:   08/07/13 1326  BP: 122/60  Pulse: 80  Temp: 98.4 F (36.9 C)  Resp: 18    Intake/Output Summary (Last 24 hours) at 08/07/13 1600 Last data filed at 08/07/13 1300  Gross per 24 hour  Intake    720 ml  Output      0 ml  Net    720 ml   Filed Weights   08/06/13 1030 08/06/13 1450 08/06/13 2035  Weight: 60.7 kg (133 lb 13.1 oz) 56.9 kg (125 lb 7.1 oz) 56.901 kg (125 lb 7.1 oz)    Exam:   General: Elderly male in no acute distress  HEENT: No pallor, moist oral mucosa  Chest: Clear to auscultation bilaterally, no added sounds  CVS: Normal S1 and S2, no murmurs rub or gallop  Abdomen: Soft, nontender, nondistended, bowel sounds present  Extremities  warm, painful range of motion of   left hip, left upper extremity AV fistula  CNS: AAO x3    Data Reviewed: Basic Metabolic Panel:  Recent Labs Lab 07/31/13 1725 08/01/13 0951 08/03/13 1442 08/04/13 0354 08/06/13 1045  NA 140 139 134* 136 136  136  K 4.8 4.5 4.3 4.1 4.7  4.8  CL 97 97 94* 96 93*  93*  CO2 28 27 26 29 25  25   GLUCOSE 85 145* 141* 78 127*  121*  BUN 42* 51* 43* 22 56*  57*  CREATININE 6.50* 7.70* 7.24* 4.66* 8.52*  8.61*  CALCIUM 10.6* 10.1 9.2  8.8 9.2  9.2  PHOS  --  3.7 2.1*  --  3.1  3.2   Liver Function Tests:  Recent Labs Lab 07/31/13 1725 08/01/13 0951 08/03/13 1442 08/06/13 1045  AST 14  --  14  --   ALT 13  --  10  --   ALKPHOS 88  --  86  --   BILITOT 0.5  --  0.3  --   PROT 8.1  --  7.5  --   ALBUMIN 3.3* 3.0* 2.9* 3.1*   No results found for this basename: LIPASE, AMYLASE,  in the last 168 hours No results found for this basename: AMMONIA,  in the last 168 hours CBC:  Recent Labs Lab 07/31/13 1725 08/01/13 0950 08/03/13 1442 08/04/13 0354 08/06/13 1059  WBC 9.6 7.8 9.3 9.2 10.0   NEUTROABS 6.1  --   --   --   --   HGB 10.1* 10.0* 9.8* 9.7* 9.8*  HCT 29.2* 28.2* 28.1* 28.3* 28.2*  MCV 75.6* 74.2* 75.1* 75.7* 74.2*  PLT 306 337 301 309 339   Cardiac Enzymes: No results found for this basename: CKTOTAL, CKMB, CKMBINDEX, TROPONINI,  in the last 168 hours BNP (last 3 results) No results found for this basename: PROBNP,  in the last 8760 hours CBG: No results found for this basename: GLUCAP,  in the last 168 hours  No results found for this or any previous visit (from the past 240 hour(s)).   Studies: No results found.  Scheduled Meds: . amLODipine  10 mg Oral QHS  . calcium carbonate  3 tablet Oral Daily  . darbepoetin (ARANESP) injection - DIALYSIS  100 mcg Intravenous Q Thu-HD  . docusate sodium  100 mg Oral QHS  . doxepin  25 mg Oral QHS  . ferric gluconate (FERRLECIT/NULECIT) IV  62.5 mg Intravenous Q Thu-HD  . heparin  5,000 Units Subcutaneous Q8H  . multivitamin  1 tablet Oral QHS  . polyethylene glycol  17 g Oral BID  . senna-docusate  1 tablet Oral BID   Continuous Infusions:     Time spent: 25 MINUTES    Ladona Rosten  [facility] Pager [phone]. If 7PM-7AM, please contact night-coverage at [email or web address], password [facility] 08/07/2013, 4:00 PM  LOS: 7 days

## 2013-08-07 NOTE — Evaluation (Signed)
Physical Therapy Evaluation Patient Details Name: [name] MRN: [id number] DOB: Nov 08, 1950 Today's Date: 08/07/2013 Time: 4098-1191 PT Time Calculation (min): 27 min  PT Assessment / Plan / Recommendation History of Present Illness    [name] is an 62 y.o. male with PMHx of recent falls and c/o left hip pain with xrays on 07/15/13 which revealed suspected lytic lesion. Patient c/o worsening left hip pain and presented 10/29 and had MR of his hip revealing numerous destructive bone lesions. Biopsy consistent with metastatic renal cell carcinoma. Per orthopedics pt is to be NWB on Lt LE. PMH includes renal disorder, HTN, anemia.    Clinical Impression  Pt adm due to the above and presents with deficits listed below (see PT problem list). Pt to benefit from skilled PT to address deficits and increase functional independence with mobility. Pt with difficulty maintaining NWB status on Lt LE due to decr cognition. Uncertain of patient's baseline cognition at this time due to no family being present. Will recommend ST SNF at this time due to decr mobility and cognition.     PT Assessment  Patient needs continued PT services    Follow Up Recommendations  SNF;Supervision/Assistance - 24 hour    Does the patient have the potential to tolerate intense rehabilitation      Barriers to Discharge Decreased caregiver support;Inaccessible home environment      Equipment Recommendations  Other (comment) (TBD at SNF )    Recommendations for Other Services     Frequency Min 3X/week    Precautions / Restrictions Precautions Precautions: Fall Restrictions Weight Bearing Restrictions: Yes LLE Weight Bearing: Non weight bearing   Pertinent Vitals/Pain C/o pain in Lt hip; did not rate      Mobility  Bed Mobility Bed Mobility: Supine to Sit;Sitting - Scoot to Edge of Bed Supine to Sit: 4: Min assist;HOB elevated;With rails Sitting - Scoot to Edge of Bed: 5: Supervision Details for Bed  Mobility Assistance: min (A) to bring trunk to upright sitting position on bed; pt with difficulty with bed mobility due to pain in Lt hip; cues for sequencing  Transfers Transfers: Sit to Stand;Stand to Sit Sit to Stand: 3: Mod assist;From bed;With upper extremity assist Stand to Sit: 3: Mod assist;To chair/3-in-1;With armrests;With upper extremity assist Details for Transfer Assistance: pt demo increased difficulty maintaining NWB status on Lt LE; requires max cues to maintain NWB status and max cues for hand placement and safety; pt unsetady  Ambulation/Gait Ambulation/Gait Assistance: 3: Mod assist Ambulation Distance (Feet): 4 Feet Assistive device: Rolling walker Ambulation/Gait Assistance Details: pt requires (A) to facilitate and maintain NWb status on Lt LE; max cues for gt sequencing and RW safety  Gait Pattern: Step-to pattern Gait velocity: decreased Stairs: No Wheelchair Mobility Wheelchair Mobility: No    Exercises General Exercises - Lower Extremity Ankle Circles/Pumps: AROM;Both;10 reps;Supine   PT Diagnosis: Difficulty walking;Generalized weakness;Acute pain  PT Problem List: Decreased strength;Decreased activity tolerance;Decreased balance;Decreased mobility;Decreased cognition;Decreased knowledge of use of DME;Decreased safety awareness;Pain PT Treatment Interventions: DME instruction;Gait training;Functional mobility training;Therapeutic activities;Therapeutic exercise;Balance training;Neuromuscular re-education;Patient/family education     PT Goals(Current goals can be found in the care plan section) Acute Rehab PT Goals Patient Stated Goal: none stated PT Goal Formulation: With patient Time For Goal Achievement: 08/21/13 Potential to Achieve Goals: Good  Visit Information  Last PT Received On: 08/07/13 Assistance Needed: +2 (for safety; pt impulsive) PT/OT Co-Evaluation/Treatment: Yes       Prior Functioning  Home Living Family/patient expects to be  discharged to:: Skilled nursing facility Living Arrangements: Other relatives Available Help at Discharge: Family;Available PRN/intermittently Additional Comments: CSW spoke with sister; pt and sister agreeable to SNF; sister concerned about level of (A) pt will need upon acute D/C  Prior Function Level of Independence: Needs assistance Gait / Transfers Assistance Needed: see comments section ADL's / Homemaking Assistance Needed: see comments  Comments: pt reports he was ambulating without AD but report 'someone' was coming in everyday to help him get in tub and dress him; pt is poor historian  Communication Communication: No difficulties    Cognition  Cognition Arousal/Alertness: Awake/alert Behavior During Therapy: Impulsive Overall Cognitive Status: Impaired/Different from baseline Area of Impairment: Attention;Safety/judgement;Problem solving Current Attention Level: Selective Safety/Judgement: Decreased awareness of deficits;Decreased awareness of safety Problem Solving: Slow processing;Decreased initiation;Difficulty sequencing;Requires verbal cues;Requires tactile cues General Comments: pt deflects questions and becomes agitated with prior level of function questions; pt unaware of WB status on Lt LE; is able to verbalize that he is NWB but then will put full weight through Lt LE and seemed unaware. no family present to determine baseline      Extremity/Trunk Assessment Upper Extremity Assessment Upper Extremity Assessment: Overall WFL for tasks assessed Lower Extremity Assessment Lower Extremity Assessment: LLE deficits/detail LLE: Unable to fully assess due to pain Cervical / Trunk Assessment Cervical / Trunk Assessment: Normal   Balance Balance Balance Assessed: Yes Static Sitting Balance Static Sitting - Balance Support: Bilateral upper extremity supported;Feet supported Static Sitting - Level of Assistance: 5: Stand by assistance Static Sitting - Comment/# of Minutes:  tolerated sitting EOB ~5 min prior to transfer   End of Session PT - End of Session Equipment Utilized During Treatment: Gait belt Activity Tolerance: Patient tolerated treatment well Patient left: in chair;with call bell/phone within reach;with chair alarm set;with nursing/sitter in room Nurse Communication: Mobility status;Precautions;Weight bearing status  GP     Donell Sievert, [place] [phone] 08/07/2013, 12:52 PM

## 2013-08-07 NOTE — Progress Notes (Signed)
Subjective: sitting  In chair eating breakfast, tolerated recliner  Hd yesterday, hip pain controlled with pain med's, some constipation Objective Vital signs in last 24 hours: Filed Vitals:   08/06/13 1450 08/06/13 1839 08/06/13 2035 08/07/13 0435  BP: 131/80 126/69 121/70 122/79  Pulse: 81 79 85 80  Temp: 98.2 F (36.8 C) 98 F (36.7 C) 98.4 F (36.9 C) 98.1 F (36.7 C)  TempSrc: Oral Oral Oral Oral  Resp: 18 16 16 16   Height:      Weight: 56.9 kg (125 lb 7.1 oz)  56.901 kg (125 lb 7.1 oz)   SpO2:  93% 96% 96%   Weight change: 2.2 kg (4 lb 13.6 oz)  Intake/Output Summary (Last 24 hours) at 08/07/13 0809 Last data filed at 08/07/13 0436  Gross per 24 hour  Intake    240 ml  Output   3301 ml  Net  -3061 ml   Labs: Basic Metabolic Panel:  Recent Labs Lab 08/01/13 0951 08/03/13 1442 08/04/13 0354 08/06/13 1045  NA 139 134* 136 136  136  K 4.5 4.3 4.1 4.7  4.8  CL 97 94* 96 93*  93*  CO2 27 26 29 25  25   GLUCOSE 145* 141* 78 127*  121*  BUN 51* 43* 22 56*  57*  CREATININE 7.70* 7.24* 4.66* 8.52*  8.61*  CALCIUM 10.1 9.2 8.8 9.2  9.2  PHOS 3.7 2.1*  --  3.1  3.2   Liver Function Tests:  Recent Labs Lab 07/31/13 1725 08/01/13 0951 08/03/13 1442 08/06/13 1045  AST 14  --  14  --   ALT 13  --  10  --   ALKPHOS 88  --  86  --   BILITOT 0.5  --  0.3  --   PROT 8.1  --  7.5  --   ALBUMIN 3.3* 3.0* 2.9* 3.1*   CBC:  Recent Labs Lab 07/31/13 1725 08/01/13 0950 08/03/13 1442 08/04/13 0354 08/06/13 1059  WBC 9.6 7.8 9.3 9.2 10.0  NEUTROABS 6.1  --   --   --   --   HGB 10.1* 10.0* 9.8* 9.7* 9.8*  HCT 29.2* 28.2* 28.1* 28.3* 28.2*  MCV 75.6* 74.2* 75.1* 75.7* 74.2*  PLT 306 337 301 309 339    Medications:   . amLODipine  10 mg Oral QHS  . calcium carbonate  3 tablet Oral Daily  . darbepoetin (ARANESP) injection - DIALYSIS  100 mcg Intravenous Q Thu-HD  . docusate sodium  100 mg Oral QHS  . doxepin  25 mg Oral QHS  . ferric gluconate  (FERRLECIT/NULECIT) IV  62.5 mg Intravenous Q Thu-HD  . heparin  5,000 Units Subcutaneous Q8H  . multivitamin  1 tablet Oral QHS  . polyethylene glycol  17 g Oral BID  . senna-docusate  1 tablet Oral BID    Physical Exam:  General: alert , thin BM , NAD  Heart: RRR / 2/6 sem lsb . No rub  Lungs: decr at bilat Bases, no rales or rhonchi  Abdomen: BS pos. , soft, nontender  Extremities: Dialysis Access: No pedal edema/ R UA AVF positive bruit  Dialysis Orders=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   [facility] Optiflux 160 2K 3.5 Ca bath, Qb 450 FR A 1.5 EDW 60 (gets to edw gains 1-2 L) right upper AVF no profile, no var Na Hectorol1, Epo 1200 (just increased from 1000) venofer 50/week   Assessment/Plan: 1 Renal Carcinoma (  clear cell type) with metastatic lesions/tumors destroying L acetabulum (biopsy+), also tumors of L pubic ramus,  R Femur and L5 vert body; also +bilat enhancing renal masses on MRI on background of polycystic kidneys- oncology seeing and Radiation Onc./ Ortho also  Following/ Palliative XRT for now and Scheduling Rad Therapy daily for 2 weeks / LIVES ALONE/Before dc contact sister Ms Hyman Hopes [phone] helps with care , transportation. / family support 2.ESRD - HD on TTS @ [place]; Next HD in am /. recliner hd  tolerating 3. HTN/Volume - BP 122/79 on Amlodipine 10 mg qhs,( Losartan 25 mg qhs DC ) post wt 56.9 yesterday /below edw. No excess vol  On exam today/ eating better / 2 l uf in am 4 Anemia - Hgb 9.8 on Aranesp 100 mcg & IV Fe on Thurs. /  5 Sec HPT - s/p parathyroidectomy; Ca 9.2 (10.1 corrected), P 3.1; no Hectorol,No binders  6 Nutrition - Alb 2.9,>3.1 renal diet, vitamin.  7 Swollen right arm - resolved s/p 08/03/13 fistulogram with angioplasty per IR.  8. Obstipation -colace incr bid  . Mira lax bid . Am and prn Sorbitol add 9 Dispo- may need SNF placement with radiation and dialysis needs, limited mobility, have asked SW to  assess    Lenny Pastel, PA-C [facility] [phone] 08/07/2013,8:09 AM  LOS: 7 days   I have seen and examined patient, discussed with PA and agree with assessment and plan as outlined above with additions as indicated. Vinson Moselle MD pager [phone]    cell [phone] 08/07/2013, 10:46 AM

## 2013-08-07 NOTE — Clinical Social Work Psychosocial (Signed)
Clinical Social Work Department BRIEF PSYCHOSOCIAL ASSESSMENT 08/07/2013  Patient:  [name], [name]     Account Number:  [id number]     Admit date:  07/31/2013  Clinical Social Worker:  Delmer Islam  Date/Time:  08/07/2013 11:05 AM  Referred by:  Physician  Date Referred:  08/07/2013 Referred for  SNF Placement   Other Referral:   Interview type:  Patient Other interview type:   CSW also spoke by phone with patient's sister, [name]    PSYCHOSOCIAL DATA Living Status:  FAMILY Admitted from facility:   Level of care:   Primary support name:  [name] Primary support relationship to patient:  SIBLING Degree of support available:   Patient looks to sister for help in decision making. Sister's phone number is [phone].    CURRENT CONCERNS Current Concerns  Post-Acute Placement   Other Concerns:    SOCIAL WORK ASSESSMENT / PLAN CSW and nurse case manager talked with patient regarding discharge plans and MD's recommendation of SNF placement for short-term rehab before going home. Patient was agitated and  informed CSW that contact should be made with his sister as she makes the decisions.    CSW contacted sister, [name] and talked with her about the recommendation of short-term rehab prior to returning home and she is in agreement. CSW explained SNF search process and her role in selecting a facility for her brother. When asked, sister requested that CSW leave facility list in patient's room (placed in drawer).   Assessment/plan status:   Other assessment/ plan:   Information/referral to community resources:   Skilled facility list for [facility]    PATIENT'S/FAMILY'S RESPONSE TO PLAN OF CARE: Patient was agitated and advised CSW to speak with his sister. [name] was pleasant and receptive to talking with CSW regarding ST rehab for patient.

## 2013-08-08 ENCOUNTER — Ambulatory Visit: Payer: Medicare Other | Admitting: Radiation Oncology

## 2013-08-08 ENCOUNTER — Telehealth: Payer: Self-pay | Admitting: *Deleted

## 2013-08-08 ENCOUNTER — Ambulatory Visit
Admit: 2013-08-08 | Discharge: 2013-08-08 | Disposition: A | Discharge status: Home / Self Care | Payer: Medicare Other | Attending: Radiation Oncology | Admitting: Radiation Oncology

## 2013-08-08 ENCOUNTER — Other Ambulatory Visit: Payer: Self-pay | Admitting: Internal Medicine

## 2013-08-08 DIAGNOSIS — C799 Secondary malignant neoplasm of unspecified site: Secondary | ICD-10-CM

## 2013-08-08 DIAGNOSIS — M25552 Pain in left hip: Secondary | ICD-10-CM

## 2013-08-08 DIAGNOSIS — M8440XD Pathological fracture, unspecified site, subsequent encounter for fracture with routine healing: Secondary | ICD-10-CM

## 2013-08-08 MED ORDER — HEPARIN SODIUM (PORCINE) 1000 UNIT/ML DIALYSIS
20.0000 [IU]/kg | Freq: Once | INTRAMUSCULAR | Status: AC
  Administered 2013-08-08: 1100 [IU] via INTRAVENOUS_CENTRAL

## 2013-08-08 MED ORDER — DSS 100 MG PO CAPS: 100.0000 mg | ORAL_CAPSULE | Freq: Every day | ORAL | Status: DC

## 2013-08-08 MED ORDER — NEPRO/CARBSTEADY PO LIQD
237.0000 mL | ORAL | Status: DC | PRN
  Filled 2013-08-08: qty 237

## 2013-08-08 MED ORDER — PENTAFLUOROPROP-TETRAFLUOROETH EX AERO: 1.0000 "application " | INHALATION_SPRAY | CUTANEOUS | Status: DC | PRN

## 2013-08-08 MED ORDER — LIDOCAINE HCL (PF) 1 % IJ SOLN: 5.0000 mL | INTRAMUSCULAR | Status: DC | PRN

## 2013-08-08 MED ORDER — HEPARIN SODIUM (PORCINE) 1000 UNIT/ML DIALYSIS
1000.0000 [IU] | INTRAMUSCULAR | Status: DC | PRN
  Administered 2013-08-08 (×2): 1000 [IU] via INTRAVENOUS_CENTRAL

## 2013-08-08 MED ORDER — ALTEPLASE 2 MG IJ SOLR
2.0000 mg | Freq: Once | INTRAMUSCULAR | Status: DC | PRN
  Filled 2013-08-08: qty 2

## 2013-08-08 MED ORDER — LIDOCAINE-PRILOCAINE 2.5-2.5 % EX CREA
1.0000 "application " | TOPICAL_CREAM | CUTANEOUS | Status: DC | PRN
  Filled 2013-08-08: qty 5

## 2013-08-08 MED ORDER — SODIUM CHLORIDE 0.9 % IV SOLN: 100.0000 mL | INTRAVENOUS | Status: DC | PRN

## 2013-08-08 MED ORDER — ALBUTEROL SULFATE (5 MG/ML) 0.5% IN NEBU: 2.5000 mg | INHALATION_SOLUTION | Freq: Four times a day (QID) | RESPIRATORY_TRACT | Status: DC | PRN

## 2013-08-08 MED ORDER — HYDROCODONE-ACETAMINOPHEN 5-325 MG PO TABS: 2.0000 | ORAL_TABLET | ORAL | Status: DC | PRN

## 2013-08-08 MED ORDER — DARBEPOETIN ALFA-POLYSORBATE 100 MCG/0.5ML IJ SOLN
INTRAMUSCULAR | Status: AC
  Filled 2013-08-08: qty 0.5

## 2013-08-08 MED ORDER — RENA-VITE PO TABS: 1.0000 | ORAL_TABLET | Freq: Every day | ORAL | Status: DC

## 2013-08-08 NOTE — Clinical Documentation Improvement (Signed)
Possible Clinical Conditions?  Chronic Systolic Congestive Heart Failure Chronic Diastolic Congestive Heart Failure Chronic Systolic & Diastolic Congestive Heart Failure Acute Systolic Congestive Heart Failure Acute Diastolic Congestive Heart Failure Acute Systolic & Diastolic Congestive Heart Failure Acute on Chronic Systolic Congestive Heart Failure Acute on Chronic Diastolic Congestive Heart Failure Acute on Chronic Systolic & Diastolic Congestive Heart Failure Other Condition Cannot Clinically Determine  Supporting Information:  Please Clarify Type & Acuity of CHF  Thank You, Nevin Bloodgood, RN, BSN, CCDS Clinical Documentation Specialist:  [phone]  [facility]- Health Information Management Cell=[phone]

## 2013-08-08 NOTE — Discharge Summary (Signed)
Physician Discharge Summary  Modesto Ganoe ZOX:[id number] DOB: [birth year] DOA: 07/31/2013  PCP: Dyke Maes, MD  Admit date: 07/31/2013 Discharge date: 08/08/2013  Time spent: 40  minutes  Recommendations for Outpatient Follow-up:  1. D/c to SNF with outpt follow up with scheduled HD.  2. palliative radiation therapy at [facility] 3. Follow up with oncology Dr Rosie Fate as outpt  Discharge Diagnoses:  Principal Problem:   Pathological fracture- of left pubic rami  Active Problems:   Left hip pain   Metastatic renal cell carcinoma   ESRD on hemodialysis   Anemia in chronic kidney disease   Hypertension   Tobacco abuse   Hepatitis C   Secondary hyperparathyroidism (of renal origin)   Discharge Condition: fair   Diet recommendation: renal  Filed Weights   08/06/13 2035 08/07/13 2007 08/08/13 1240  Weight: 56.901 kg (125 lb 7.1 oz) 56.898 kg (125 lb 7 oz) 58.6 kg (129 lb 3 oz)    History of present illness:  Please review admission H&P for details but in brief, 62 y/o male with ESRD on hemodialysis (Tu , Th , Sat), attention, ongoing tobacco use presented to the ED for worsening left hip pain. An x-ray of his hip 2 weeks ago showed A lytic lesion over left hip with pathological fracture. MRI on admission showed numerous destructive bone lesions with the largest lesion destroying the left acetabulum consistent with metastatic disease. Marland Kitchen He underwent soft tissue biopsy and results show metastatic renal cell carcinoma. Oncology consulted and recommended outpatient follow up. Orthopedics recommended radiation to the hip for pain, no surgical intervention because of the extensive nature of the disease. Recommendations are to continue with NWB due to the compromised done and anterior wall of the acetabulum and proximal femur. Radiation oncology consulted and he is on total 10 treatments.    Hospital Course:  Left hip pain/metastatic disease to left pubic rami & acetabulum  - secondary  to metastatic renal cell carcinoma.  - CT abd and pelvis and chest revealed multiple renal masses and lymphadenoapthy . - appreciate oncology recommendation.will schedule  follow up as outpatient  - per ortho  Recommended for radiation therapy and NWB of the left hip . Patient using walker for ambulation with NWB on left hip - currently undergoing palliative radiation of left hipat [facility] .Marland Kitchen Next therapy on 11/7. i have spoken with Dr Broadus John nurse who will call the SNF to arrange for his treatment.  ESRD on dialysis  Received HD today -management per nephrology. Will be followed as outpt  Hypertension  - stable. Continue amlodipine   Anemia  - Secondary to chronic kidney disease   Tobacco abuse  - counseled on cessation   Code Status: full code  Family Communication: sistr updated of the plan over the phone Disposition Plan: skilled Nursing facility   Consultants:  IR  ONCOLOGY  RADIATION ONCOLOGY  NEPHROLOGY  ORTHOPEDICS   Procedures:  CT guided biopsy of the soft tissue lesion in the left pelvis.    Antibiotics:  None   Discharge Exam: Filed Vitals:   08/08/13 1630  BP: 116/71  Pulse: 73  Temp:   Resp:     General: Elderly male in no acute distress  HEENT: No pallor, moist oral mucosa  Chest: Clear to auscultation bilaterally, no added sounds  CVS: Normal S1 and S2, no murmurs rub or gallop Abdomen: Soft, nontender, nondistended, bowel sounds present  Extremities warm, painful range of motion of left hip, left upper extremity AV fistula  CNS:  AAO x3   Discharge Instructions   Future Appointments Provider Department Dept Phone   08/09/2013 1:15 PM [facility] [id number] [facility] [phone]   08/12/2013 11:50 AM [facility] [id number] [facility] [phone]   08/13/2013 7:25 PM [facility] [id number] [facility] [phone]   08/14/2013 6:55 PM [facility]  [id number] [facility] [phone]   08/15/2013 12:00 PM [facility] [id number] [facility] [phone]   08/16/2013 12:00 PM [facility] [id number] [facility] [phone]   08/19/2013 12:20 PM [facility] [id number] [facility] [phone]   08/20/2013 12:00 PM [facility] [id number] [facility] [phone]   08/21/2013 12:00 PM [facility] [id number]  [facility] [phone]       Medication List         albuterol (5 MG/ML) 0.5% nebulizer solution  Commonly known as:  PROVENTIL  Take 0.5 mLs (2.5 mg total) by nebulization every 6 (six) hours as needed for wheezing or shortness of breath.     amLODipine 10 MG tablet  Commonly known as:  NORVASC  Take 10 mg by mouth at bedtime.     calcium carbonate 500 MG chewable tablet  Commonly known as:  TUMS - dosed in mg elemental calcium  Chew 3 tablets by mouth daily.     doxepin 25 MG capsule  Commonly known as:  SINEQUAN  Take 25 mg by mouth at bedtime.     DSS 100 MG Caps  Take 100 mg by mouth at bedtime.     HYDROcodone-acetaminophen 5-325 MG per tablet  Commonly known as:  NORCO/VICODIN  Take 2 tablets by mouth every 4 (four) hours as needed for moderate pain.     losartan 25 MG tablet  Commonly known as:  COZAAR  Take 25 mg by mouth at bedtime.     multivitamin Tabs tablet  Take 1 tablet by mouth at bedtime.     traMADol 50 MG tablet  Commonly known as:  ULTRAM  Take 50 mg by mouth every 6 (six) hours as needed for pain (pain).       No Known Allergies     Follow-up Information   Follow up with MATTINGLY,[name], MD In 2 weeks.   Specialty:  Nephrology   Contact information:   [address] [place] [address] [phone]       Follow up with CHISM, DAVID, MD In 2 weeks.   Specialty:  Internal Medicine    Contact information:   [address] [place] [address] [phone]       Follow up with Margaretmary Dys A, MD In 1 week. (scheduled radiation therapy)    Specialty:  Radiation Oncology   Contact information:   [address] [place] [address] [phone]        The results of significant diagnostics from this hospitalization (including imaging, microbiology, ancillary and laboratory) are listed below for reference.    Significant Diagnostic Studies: Dg Chest 2 View  07/15/2013   CLINICAL DATA:  Cough. Chest pain. Fall.  Left hip pain.  EXAM: CHEST  2 VIEW  COMPARISON:  12/24/2010  FINDINGS: Mildly enlarged cardiopericardial silhouette noted with prominence of upper zone pulmonary vasculature. There is abnormal blunting of the right costophrenic angle with adjacent airspace opacity in the right lung base. Nonspecific focal airspace opacity along the left hemidiaphragm is observed.  There is  an acute or subacute right 6th rib fracture post for laterally.  IMPRESSION: 1. Small right pleural effusion with adjacent with suspected atelectasis. 2. Atelectasis or pulmonary nodule along the left hemidiaphragm. Followup chest radiography to ensure clearance, or chest CT, is recommended. 3. Mild cardiomegaly with pulmonary venous hypertension but without overt edema. 4. Acute or subacute right posterolateral 6th rib fracture.   Electronically Signed   By: Herbie Baltimore M.D.   On: 07/15/2013 13:40   Dg Lumbar Spine 2-3 Views  07/31/2013   CLINICAL DATA:  Low back pain with recent metastatic diagnosis  EXAM: LUMBAR SPINE - 2-3 VIEW  COMPARISON:  Contemporaneously body CT.  FINDINGS: Extensive metastatic disease of the lumbar spine is under appreciated by radiography. There is a lytic lesion in the posterior L5 body and an extensive permeative lesion in the L2 body. There is a superior endplate fracture of L2 noted. Patchy sclerosis, present in the lower L2, upper L3, lower L4, and  upper L5 bodies that is most likely degenerative in nature.  IMPRESSION: 1. Metastatic disease in the L2 and L5 bodies. 2. Pathologic L2 superior endplate fracture with mild compression.   Electronically Signed   By: Tiburcio Pea M.D.   On: 07/31/2013 22:23   Dg Hip Complete Left  07/15/2013   CLINICAL DATA:  Fall. Left hip pain.  EXAM: LEFT HIP - COMPLETE 2+ VIEW  COMPARISON:  None.  FINDINGS: There is abnormal lucency in the left superior pubic ramus laterally, extending towards the acetabulum, with suspected fractures through this lucent region of bone. There is also an abnormal sclerotic lesion in the left inferior pubic ramus with some associated subtle linear lucency internally.  Calcification in the vicinity of the acetabular labrum may be from labral degeneration or a chronically fragmented spurring.  IMPRESSION: 1. Suspected lucent/lytic expansile lesion of the lateral portion of the left superior pubic ramus with underlying pathologic fractures potentially extending into the quadrilateral plate of the acetabulum. Sclerotic lesion in the left inferior pubic ramus suspicious for metastatic lesion, potentially with internal stress fracture. Correlate with patient history of malignancy. Consider whole-body bone scan bone scan and/ or MRI for further characterization and to assess the rest of the skeleton. 2. Fragmented acetabular spurring versus labral degeneration.   Electronically Signed   By: Herbie Baltimore M.D.   On: 07/15/2013 13:46   Ct Chest W Contrast  07/31/2013   CLINICAL DATA:  Bone lesions.  Renal failure.  EXAM: CT CHEST, ABDOMEN, AND PELVIS WITH CONTRAST  TECHNIQUE: Multidetector CT imaging of the chest, abdomen and pelvis was performed following the standard protocol during bolus administration of intravenous contrast.  CONTRAST:  80mL OMNIPAQUE IOHEXOL 300 MG/ML  SOLN  COMPARISON:  07/11/2005  FINDINGS: CT CHEST FINDINGS  Hyperemic 2.3 cm right paratracheal lymph node. Smaller  enlarged precarinal, subcarinal, pretracheal, and prevascular lymph nodes. Trace left small and right pleural effusions. There is a permeative lesion in the proximal right 9th rib with surrounding soft tissue and associated pathologic fracture. Smaller lytic lesion with path fracture through lateral aspect right 6th rib. Old healed left rib fractures. Small subpleural blebs in both upper lobes. There is dependent atelectasis in both lower lobes, right greater than left, with no discrete mass identified.  CT ABDOMEN AND PELVIS FINDINGS  Surgical clips in the gallbladder fossa. Unremarkable liver, spleen, adrenal glands. Innumerable cysts throughout both kidneys. 4.5 cm enhancing mass in the interpolar region of the left kidney. 3.3 cm enhancing mass in the upper  pole of the right kidney. Renal veins are patent. No retroperitoneal adenopathy. Atheromatous nondilated abdominal aorta. Stomach, small bowel and colon are nondilated. Expansile lytic enhancing masses involve the superior left pubic ramus and anterior acetabulum, L2 with pathologic fracture, L5 vertebral body, posterior right iliac bone, and intertrochanteric region of the right femur. Enlarged 14 mm right inguinal lymph node. Trace pelvic ascites. No free air.  IMPRESSION: 1. Bilateral enhancing renal masses suggesting either synchronous renal cell carcinoma or metastatic disease. 2. 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 3. Mediastinal adenopathy. 4. Pleural effusions, right greater than left. 5. Innumerable renal cysts suggesting polycystic kidney disease.   Electronically Signed   By: Oley Balm M.D.   On: 07/31/2013 22:25   Ct Abdomen Pelvis W Contrast  07/31/2013   CLINICAL DATA:  Bone lesions.  Renal failure.  EXAM: CT CHEST, ABDOMEN, AND PELVIS WITH CONTRAST  TECHNIQUE: Multidetector CT imaging of the chest, abdomen and pelvis was performed following the standard  protocol during bolus administration of intravenous contrast.  CONTRAST:  80mL OMNIPAQUE IOHEXOL 300 MG/ML  SOLN  COMPARISON:  07/11/2005  FINDINGS: CT CHEST FINDINGS  Hyperemic 2.3 cm right paratracheal lymph node. Smaller enlarged precarinal, subcarinal, pretracheal, and prevascular lymph nodes. Trace left small and right pleural effusions. There is a permeative lesion in the proximal right 9th rib with surrounding soft tissue and associated pathologic fracture. Smaller lytic lesion with path fracture through lateral aspect right 6th rib. Old healed left rib fractures. Small subpleural blebs in both upper lobes. There is dependent atelectasis in both lower lobes, right greater than left, with no discrete mass identified.  CT ABDOMEN AND PELVIS FINDINGS  Surgical clips in the gallbladder fossa. Unremarkable liver, spleen, adrenal glands. Innumerable cysts throughout both kidneys. 4.5 cm enhancing mass in the interpolar region of the left kidney. 3.3 cm enhancing mass in the upper pole of the right kidney. Renal veins are patent. No retroperitoneal adenopathy. Atheromatous nondilated abdominal aorta. Stomach, small bowel and colon are nondilated. Expansile lytic enhancing masses involve the superior left pubic ramus and anterior acetabulum, L2 with pathologic fracture, L5 vertebral body, posterior right iliac bone, and intertrochanteric region of the right femur. Enlarged 14 mm right inguinal lymph node. Trace pelvic ascites. No free air.  IMPRESSION: 1. Bilateral enhancing renal masses suggesting either synchronous renal cell carcinoma or metastatic disease. 2. 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 3. Mediastinal adenopathy. 4. Pleural effusions, right greater than left. 5. Innumerable renal cysts suggesting polycystic kidney disease.   Electronically Signed   By: Oley Balm M.D.   On: 07/31/2013 22:25   Mr Hip Left Wo  Contrast  07/31/2013   CLINICAL DATA:  Severe left hip pain. Abnormal radiographs dated 07/15/2013  EXAM: MRI OF THE LEFT HIP WITHOUT CONTRAST  TECHNIQUE: Multiplanar, multisequence MR imaging was performed. No intravenous contrast was administered.  COMPARISON:  Radiographs dated 07/15/2013  FINDINGS: There is a 6 x 6 x 5 cm inhomogeneous destructive soft tissue mass destroying the anterior medial and anterior superior aspects of the left acetabulum and the lateral aspect of the left superior pubic ramus. The tumor protrudes into the pelvis. Tumor extends into the left internal obturator muscle.  There is an expansile 2 cm mass in the posterior aspect of the L5 vertebral body slightly protruding into the spinal canal. There is a 2.5 cm destructive lesion in the  posterior aspect of the left iliac bone adjacent to the superior aspect of the left sacroiliac joint. There is a 16 mm destructive lesion in the posterior lateral aspect of the proximal right femur just below the right greater trochanter. There is a 4 cm destructive lesion in the medullary canal of the proximal right femoral shaft.  The patient does have fairly severe arthritis of the superior aspect of the left femoral head with joint space narrowing and degenerative changes of the remaining portion of the left acetabulum. There small bilateral hip effusions. There is extensive edema in the adjacent soft tissues around the left hip. The edema extends into the left side of the pelvis.  2.7 cm cyst in the right groin. This is of unknown etiology but appears benign.  IMPRESSION: 1. Numerous destructive bone lesions, with the largest lesion destroying the left acetabulum, consistent with metastatic disease. 2. 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 3. Critical Value/emergent results were called by telephone at the time of interpretation on 07/31/2013 at 1:15 PM to Dr.KELLIE [facility] , who verbally acknowledged these results.   Electronically Signed    By: Geanie Cooley M.D.   On: 07/31/2013 13:25   Ir Pta Venous Right  08/03/2013   CLINICAL DATA:  62 year old with end-stage renal disease and right arm fistula. The patient has right arm swelling.  EXAM: RIGHT UPPER EXTREMITY FISTULOGRAM; CENTRAL VENOUS ANGIOPLASTY  Physician: Rachelle Hora. Lowella Dandy, MD  MEDICATIONS AND MEDICAL HISTORY: Fentanyl 50 mcg.  FLUOROSCOPY TIME:  2 min and 48 seconds  PROCEDURE: The procedure was explained to the patient. The risks and benefits of the procedure were discussed and the patient's questions were addressed. Informed consent was obtained from the patient. Right upper extremity fistula was accessed with an angiocath. Fistulogram images were obtained. Arm was prepped and draped in a sterile fashion. Maximal barrier sterile technique was utilized including caps, mask, sterile gowns, sterile gloves, sterile drape, hand hygiene and skin antiseptic. The skin was anesthetized with 1% lidocaine. Angiocath was exchanged for a 7-French vascular sheath over a Bentson wire. A 5 [place] Kumpe catheter was easily advanced into the right central veins. Additional angiography was performed. A Bentson wire was easily advanced into the superior vena cava and the inferior vena cava. A 8 mm x 40 mm Conquest balloon was positioned at the junction of the right innominate vein and SVC. The balloon was inflated without complication. A significant waist formation was noted. Follow up angiography demonstrated improved flow in this area. This focal stenosis was treated a second time with a 10 mm x 40 mm balloon. This balloon was inflated for 2 minutes. The patient complained of some chest discomfort during this balloon inflation. Follow up angiography was performed. The wire was removed. The catheter was removed with a pursestring suture.  FINDINGS: The right upper arm fistula is patent. There was a focal critical stenosis at the junction of the right innominate vein and SVC. There were large collateral vessels  in the right upper chest and neck region. The post angioplasty images demonstrated markedly improved flow through the right innominate vein. There was still some filling of the collateral vessels in the right upper chest and neck.  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 This stenosis was successfully treated with balloon angioplasty.   Electronically Signed   By: Richarda Overlie M.D.   On: 08/03/2013 12:38   Ct Biopsy  08/02/2013   CLINICAL DATA:  62 year old  with renal lesions and multiple bone lesions. Tissue diagnosis is needed.  EXAM: CT BIOPSY OF LEFT PELVIC SOFT TISSUE MASS.  Physician: Rachelle Hora. Henn, MD  MEDICATIONS: Versed 2 mg, fentanyl 75 mcg. A radiology nurse monitored the patient for moderate sedation.  ANESTHESIA/SEDATION: Moderate sedation time: 17 min  PROCEDURE: The procedure was explained to the patient. The risks and benefits of the procedure were discussed and the patient's questions were addressed. Informed consent was obtained from the patient. The patient was placed supine on the CT scanner. Images through the pelvis were obtained. The lesion in the anterior left hip was identified. The left anterior pelvis was prepped and draped in a sterile fashion. Skin was anesthetized with 1% lidocaine. Using CT guidance, a 17 gauge needle was directed into the pelvic soft tissue mass. Needle position was confirmed with CT. Three core biopsies were obtained with an 18 gauge device. Specimens were placed in formalin.  COMPLICATIONS: None  FINDINGS: There is a destructive soft tissue mass involving the anterior aspect of the left iliac bone and the left acetabulum. Needle position was confirmed within the soft tissue lesion.  IMPRESSION: CT-guided biopsy of the left pelvic soft tissue mass.   Electronically Signed   By: Richarda Overlie M.D.   On: 08/02/2013 17:07   Dg Chest Portable 1 View  07/31/2013   CLINICAL DATA:  The shortness breath. DVT.  EXAM:  PORTABLE CHEST - 1 VIEW  COMPARISON:  07/15/2013 and 12/02/2009  FINDINGS: Right base wedge-shaped consolidation. This may represent atelectasis. Pulmonary infarct or infiltrate not excluded. Recommend followup until clearance.  Elevated right hemidiaphragm. This limits evaluation of the right lung base.  Pulmonary vascular prominence most notable centrally.  Cardiomegaly.  Tortuous aorta.  The previously questioned nodule adjacent to the left hemidiaphragm is not delineated on the present exam. Recommend followup two view chest when able with attention to this region.  IMPRESSION: Right base wedge-shaped consolidation. This may represent atelectasis. Pulmonary infarct or infiltrate not excluded.  Pulmonary vascular prominence most notable centrally.  Cardiomegaly.  Tortuous aorta.  The previously questioned nodule adjacent to the left hemidiaphragm is not delineated on the present exam. Recommend followup two view chest when able with attention to this region.   Electronically Signed   By: Bridgett Larsson M.D.   On: 07/31/2013 17:52   Ir Shuntogram/ Fistulagram Right Mod Sed  08/03/2013   CLINICAL DATA:  62 year old with end-stage renal disease and right arm fistula. The patient has right arm swelling.  EXAM: RIGHT UPPER EXTREMITY FISTULOGRAM; CENTRAL VENOUS ANGIOPLASTY  Physician: Rachelle Hora. Lowella Dandy, MD  MEDICATIONS AND MEDICAL HISTORY: Fentanyl 50 mcg.  FLUOROSCOPY TIME:  2 min and 48 seconds  PROCEDURE: The procedure was explained to the patient. The risks and benefits of the procedure were discussed and the patient's questions were addressed. Informed consent was obtained from the patient. Right upper extremity fistula was accessed with an angiocath. Fistulogram images were obtained. Arm was prepped and draped in a sterile fashion. Maximal barrier sterile technique was utilized including caps, mask, sterile gowns, sterile gloves, sterile drape, hand hygiene and skin antiseptic. The skin was anesthetized with 1%  lidocaine. Angiocath was exchanged for a 7-French vascular sheath over a Bentson wire. A 5 [place] Kumpe catheter was easily advanced into the right central veins. Additional angiography was performed. A Bentson wire was easily advanced into the superior vena cava and the inferior vena cava. A 8 mm x 40 mm Conquest balloon was positioned at the junction  of the right innominate vein and SVC. The balloon was inflated without complication. A significant waist formation was noted. Follow up angiography demonstrated improved flow in this area. This focal stenosis was treated a second time with a 10 mm x 40 mm balloon. This balloon was inflated for 2 minutes. The patient complained of some chest discomfort during this balloon inflation. Follow up angiography was performed. The wire was removed. The catheter was removed with a pursestring suture.  FINDINGS: The right upper arm fistula is patent. There was a focal critical stenosis at the junction of the right innominate vein and SVC. There were large collateral vessels in the right upper chest and neck region. The post angioplasty images demonstrated markedly improved flow through the right innominate vein. There was still some filling of the collateral vessels in the right upper chest and neck.  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 This stenosis was successfully treated with balloon angioplasty.   Electronically Signed   By: Richarda Overlie M.D.   On: 08/03/2013 12:38    Microbiology: No results found for this or any previous visit (from the past 240 hour(s)).   Labs: Basic Metabolic Panel:  Recent Labs Lab 08/03/13 1442 08/04/13 0354 08/06/13 1045  NA 134* 136 136  136  K 4.3 4.1 4.7  4.8  CL 94* 96 93*  93*  CO2 26 29 25  25   GLUCOSE 141* 78 127*  121*  BUN 43* 22 56*  57*  CREATININE 7.24* 4.66* 8.52*  8.61*  CALCIUM 9.2 8.8 9.2  9.2  PHOS 2.1*  --  3.1  3.2   Liver Function  Tests:  Recent Labs Lab 08/03/13 1442 08/06/13 1045  AST 14  --   ALT 10  --   ALKPHOS 86  --   BILITOT 0.3  --   PROT 7.5  --   ALBUMIN 2.9* 3.1*   No results found for this basename: LIPASE, AMYLASE,  in the last 168 hours No results found for this basename: AMMONIA,  in the last 168 hours CBC:  Recent Labs Lab 08/03/13 1442 08/04/13 0354 08/06/13 1059  WBC 9.3 9.2 10.0  HGB 9.8* 9.7* 9.8*  HCT 28.1* 28.3* 28.2*  MCV 75.1* 75.7* 74.2*  PLT 301 309 339   Cardiac Enzymes: No results found for this basename: CKTOTAL, CKMB, CKMBINDEX, TROPONINI,  in the last 168 hours BNP: BNP (last 3 results) No results found for this basename: PROBNP,  in the last 8760 hours CBG: No results found for this basename: GLUCAP,  in the last 168 hours     Signed:  Eddie North  [facility] 08/08/2013, 4:45 PM

## 2013-08-08 NOTE — Progress Notes (Signed)
  [facility] Progress Note    Subjective: In better spirits, talked w SW yest about SNFP, bowels better   Exam  Blood pressure 119/71, pulse 82, temperature 98.8 F (37.1 C), temperature source Oral, resp. rate 18, height 5\' 7"  (1.702 m), weight 56.898 kg (125 lb 7 oz), SpO2 100.00%. Gen: alert , thin BM , NAD  Heart: RRR / 2/6 sem lsb . No rub  Lungs: decr at bilat Bases, no rales or rhonchi  Abdomen: BS pos. , soft, nontender  Ext: no LE or UE edema Access: RUA AVF positive bruit  Dialysis Orders [facility]  4 hr  F160 2K 3.5 Ca    60kg   RUA AVF  Hectorol 1ug   Epo 1200 (just increased from 1000)  Venofer 50/week   Assessment/Plan: 1 Renal cell cancer with metastatic lesions/tumors destroying L acetabulum (biopsy+), also tumors of L pubic ramus,  R Femur and L5 vert body; also +bilat enhancing renal masses on MRI; getting palliative XRT 2.ESRD - HD on TTS @ [place]; Next HD in am /. recliner hd  tolerating 3. HTN/Volume - BP better, lowering dry wt, stopping BP meds, will d/c norvasc today 4 Anemia - Hgb 9.8 on Aranesp 100 mcg & IV Fe on Thurs. /  5 Sec HPT - s/p parathyroidectomy; Ca 9.2 (10.1 corrected), P 3.1; no Hectorol,No binders  6 Nutrition - Alb 2.9,>3.1 renal diet, vitamin.  7 Swollen right arm - resolved s/p 08/03/13 fistulogram with angioplasty per IR.  8 Obstipation - better 9 Dispo- SNFP     Vinson Moselle MD  pager [phone]    cell [phone]  08/08/2013, 11:00 AM   Recent Labs Lab 08/03/13 1442 08/04/13 0354 08/06/13 1045  NA 134* 136 136  136  K 4.3 4.1 4.7  4.8  CL 94* 96 93*  93*  CO2 26 29 25  25   GLUCOSE 141* 78 127*  121*  BUN 43* 22 56*  57*  CREATININE 7.24* 4.66* 8.52*  8.61*  CALCIUM 9.2 8.8 9.2  9.2  PHOS 2.1*  --  3.1  3.2    Recent Labs Lab 08/03/13 1442 08/06/13 1045  AST 14  --   ALT 10  --   ALKPHOS 86  --   BILITOT 0.3  --   PROT 7.5  --   ALBUMIN 2.9* 3.1*    Recent Labs Lab 08/03/13 1442  08/04/13 0354 08/06/13 1059  WBC 9.3 9.2 10.0  HGB 9.8* 9.7* 9.8*  HCT 28.1* 28.3* 28.2*  MCV 75.1* 75.7* 74.2*  PLT 301 309 339   . amLODipine  10 mg Oral QHS  . calcium carbonate  3 tablet Oral Daily  . darbepoetin (ARANESP) injection - DIALYSIS  100 mcg Intravenous Q Thu-HD  . docusate sodium  100 mg Oral QHS  . doxepin  25 mg Oral QHS  . ferric gluconate (FERRLECIT/NULECIT) IV  62.5 mg Intravenous Q Thu-HD  . heparin  5,000 Units Subcutaneous Q8H  . multivitamin  1 tablet Oral QHS  . polyethylene glycol  17 g Oral BID  . senna-docusate  1 tablet Oral BID     acetaminophen, acetaminophen, albuterol, HYDROcodone-acetaminophen, HYDROmorphone (DILAUDID) injection, ondansetron (ZOFRAN) IV, ondansetron, sorbitol

## 2013-08-08 NOTE — Progress Notes (Signed)
PT Cancellation Note  Patient Details Name: [name] MRN: [id number] DOB: 07-May-1951   Cancelled Treatment:    Reason Eval/Treat Not Completed: Patient at procedure or test/unavailable. Pt being transported to [facility] for radiation. Will check back with pt next available time.    Donnamarie Poag [place] , [place] [phone]  08/08/2013, 8:46 AM

## 2013-08-08 NOTE — Progress Notes (Signed)
  Radiation Oncology         [phone]) [phone] ________________________________  Name: [name] MRN: [id number]  Date: 08/08/2013  DOB: [birth year]  Simulation Verification Note  Status: inpatient  NARRATIVE: The patient was brought to the treatment unit and placed in the planned treatment position. The clinical setup was verified. Then port films were obtained and uploaded to the radiation oncology medical record software.  The treatment beams were carefully compared against the planned radiation fields. The position location and shape of the radiation fields was reviewed. They targeted volume of tissue appears to be appropriately covered by the radiation beams. Organs at risk appear to be excluded as planned.  Based on my personal review, I approved the simulation verification. The patient's treatment will proceed as planned.  ------------------------------------------------  [name], M.D.

## 2013-08-08 NOTE — Progress Notes (Signed)
Pt prepared for d/c to SNF. Skin intact except as most recently charted. Vitals are stable. Report called to [facility] at receiving facility [facility]). Pt to be transported by ambulance service.  Peri Maris, MBA, BS, RN

## 2013-08-08 NOTE — Telephone Encounter (Signed)
Called and spoke with Kristin,RN on HS, patient to be transferred to [facility] on [address],called [phone], spoke with Lillia Dallas, informed her of patient being transferred to her facility tonight, will fax over patient's radiation treatment schedule but will have to change the date times on 08/13/13& 08/16/13 , thanked Luna Kitchens will fax now and informed Sonda Rumble of  Status, will in basket MD and Nurses here, their fax # [phone] 5:21 PM

## 2013-08-08 NOTE — Telephone Encounter (Addendum)
Patient to be d/c from [facility] today, per Hospitalist MD pleas arrange transportation for patient being d/c to [facility] for  His  treatments  Will call [facility] today thanked MD for the call 4:55 PM

## 2013-08-09 ENCOUNTER — Ambulatory Visit: Payer: Medicare Other

## 2013-08-09 ENCOUNTER — Ambulatory Visit
Admit: 2013-08-09 | Discharge: 2013-08-09 | Disposition: A | Discharge status: Home / Self Care | Payer: Medicare Other | Attending: Radiation Oncology | Admitting: Radiation Oncology

## 2013-08-09 ENCOUNTER — Telehealth: Payer: Self-pay | Admitting: Internal Medicine

## 2013-08-09 ENCOUNTER — Telehealth: Payer: Self-pay

## 2013-08-09 NOTE — Telephone Encounter (Signed)
Per Dr Rosie Fate POF 11/6 called sw sister Maxie Barb made aware of 11/6 appt shh

## 2013-08-09 NOTE — Telephone Encounter (Signed)
Spoke with Magazine features editor [phone]) and updated her on patient appointment times.Will send calendar on Monday.Patient has dialysis on Tuesday and Thursday. Gave her our number for nursing and linac [phone] if she needs to call regarding schedule.

## 2013-08-12 ENCOUNTER — Ambulatory Visit
Admit: 2013-08-12 | Discharge: 2013-08-12 | Disposition: A | Discharge status: Home / Self Care | Payer: Medicare Other | Attending: Radiation Oncology | Admitting: Radiation Oncology

## 2013-08-12 ENCOUNTER — Ambulatory Visit: Payer: Medicare Other

## 2013-08-13 ENCOUNTER — Ambulatory Visit
Admission: RE | Admit: 2013-08-13 | Discharge: 2013-08-13 | Disposition: A | Discharge status: Home / Self Care | Payer: Medicare Other | Source: Ambulatory Visit | Attending: Radiation Oncology | Admitting: Radiation Oncology

## 2013-08-13 ENCOUNTER — Ambulatory Visit: Payer: Medicare Other

## 2013-08-14 ENCOUNTER — Ambulatory Visit: Payer: Medicare Other

## 2013-08-14 ENCOUNTER — Ambulatory Visit
Admission: RE | Admit: 2013-08-14 | Discharge: 2013-08-14 | Disposition: A | Discharge status: Home / Self Care | Payer: Medicare Other | Source: Ambulatory Visit | Attending: Radiation Oncology | Admitting: Radiation Oncology

## 2013-08-14 ENCOUNTER — Encounter: Payer: Self-pay | Admitting: Internal Medicine

## 2013-08-14 ENCOUNTER — Non-Acute Institutional Stay (SKILLED_NURSING_FACILITY): Payer: Medicare Other | Admitting: Internal Medicine

## 2013-08-14 ENCOUNTER — Encounter: Payer: Self-pay | Admitting: Radiation Oncology

## 2013-08-14 VITALS — BP 128/79 | HR 77 | Temp 97.7°F | Resp 20 | Wt 121.6 lb

## 2013-08-14 DIAGNOSIS — D631 Anemia in chronic kidney disease: Secondary | ICD-10-CM

## 2013-08-14 DIAGNOSIS — N186 End stage renal disease: Secondary | ICD-10-CM

## 2013-08-14 DIAGNOSIS — C7951 Secondary malignant neoplasm of bone: Secondary | ICD-10-CM

## 2013-08-14 DIAGNOSIS — M25552 Pain in left hip: Secondary | ICD-10-CM

## 2013-08-14 DIAGNOSIS — IMO0002 Reserved for concepts with insufficient information to code with codable children: Secondary | ICD-10-CM

## 2013-08-14 DIAGNOSIS — M25559 Pain in unspecified hip: Secondary | ICD-10-CM

## 2013-08-14 DIAGNOSIS — Z720 Tobacco use: Secondary | ICD-10-CM

## 2013-08-14 DIAGNOSIS — M8440XP Pathological fracture, unspecified site, subsequent encounter for fracture with malunion: Secondary | ICD-10-CM

## 2013-08-14 DIAGNOSIS — F17200 Nicotine dependence, unspecified, uncomplicated: Secondary | ICD-10-CM

## 2013-08-14 DIAGNOSIS — N2581 Secondary hyperparathyroidism of renal origin: Secondary | ICD-10-CM

## 2013-08-14 DIAGNOSIS — N189 Chronic kidney disease, unspecified: Secondary | ICD-10-CM

## 2013-08-14 DIAGNOSIS — C649 Malignant neoplasm of unspecified kidney, except renal pelvis: Secondary | ICD-10-CM

## 2013-08-14 DIAGNOSIS — B1920 Unspecified viral hepatitis C without hepatic coma: Secondary | ICD-10-CM

## 2013-08-14 DIAGNOSIS — Z992 Dependence on renal dialysis: Secondary | ICD-10-CM

## 2013-08-14 NOTE — Progress Notes (Signed)
Patient residing at [facility] on [facility] fluids,blanket,declined, did put pillow behind back  2:38 PM  2:37 PM

## 2013-08-14 NOTE — Progress Notes (Signed)
Patient ID: [name], male   DOB: Apr 30, 1951, 62 y.o.   MRN: [id number] Provider:  Gwenith Spitz. Renato Gails, D.O., C.M.D. Location:  [facility]  PCP: Dyke Maes, MD  Code Status: full code  No Known Allergies  Chief Complaint  Patient presents with  . Hospitalization Follow-up    new admission s/p hospitalization for left pubic ramus fracture found to be pathologic from metastatic renal cell carcinoma, now here for rehab while undergoing palliative XRT     HPI: 62 y.o. male with h/o ESRD on HD Tues/Thurs/Sat, secondary hyperparathyroidism, hypertension with chronic kidney disease, hepatitis C, ongoing tobacco abuse, and anemia of chronic kidney disease was admitted here for short term rehab s/p hospitalization 10/29-11/6/14 for left hip pain.  Xrays revealed left pathologic pubic ramus fracture secondary to lytic lesion left hip.  Soft tissue biopsy revealed renal cell carcinoma and CT imaging also showed multiple renal masses and lymphadenopathy.  He was followed by his nephrologist, Dr. Kathrynn Running.  Consultants included oncology, Dr. Rosie Fate, and radiation oncology, Dr. Alvy Beal.  The decision was made to manage his cancer with palliative XRT at [facility] for 10 treatments due to the widespread nature of his cancer.    He is nonweightbearing at this time due to the pathologic fracture of his left pubic ramus.    When seen, he described his tremendous faith to me.    ROS: Review of Systems  Constitutional: Positive for malaise/fatigue. Negative for fever.  Respiratory: Negative for shortness of breath.   Cardiovascular: Negative for chest pain.  Gastrointestinal: Positive for constipation.  Musculoskeletal: Positive for back pain, joint pain and myalgias. Negative for falls.  Neurological: Positive for weakness. Negative for dizziness, loss of consciousness and headaches.  Psychiatric/Behavioral: Positive for depression.     Past Medical History  Diagnosis Date   . Renal disorder   . Hypertension   . Dialysis patient   . Secondary hyperparathyroidism (of renal origin)     s/p parathyroidectomy  . Anemia due to chronic disease treated with erythropoietin   . 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 Closed fracture of pubic ramus 08/2013    pathologic (metastatic renal cell ca)   Past Surgical History  Procedure Laterality Date  . Av fistula placement     Social History:   reports that he has been smoking Cigarettes.  He has a 15 pack-year smoking history. He uses smokeless tobacco. He reports that he does not drink alcohol. His drug history is not on file.  Family History  Problem Relation Age of Onset  . Lung cancer      Medications: Patient's Medications  New Prescriptions   No medications on file  Previous Medications   ALBUTEROL (PROVENTIL) (5 MG/ML) 0.5% NEBULIZER SOLUTION    Take 0.5 mLs (2.5 mg total) by nebulization every 6 (six) hours as needed for wheezing or shortness of breath.   AMLODIPINE (NORVASC) 10 MG TABLET    Take 10 mg by mouth at bedtime.   CALCIUM CARBONATE (TUMS - DOSED IN MG ELEMENTAL CALCIUM) 500 MG CHEWABLE TABLET    Chew 3 tablets by mouth daily.   DOCUSATE SODIUM 100 MG CAPS    Take 100 mg by mouth at bedtime.   DOXEPIN (SINEQUAN) 25 MG CAPSULE    Take 25 mg by mouth at bedtime.   HYDROCODONE-ACETAMINOPHEN (NORCO/VICODIN) 5-325 MG PER TABLET    Take 2 tablets by mouth every 4 (four) hours as needed for moderate pain.   LOSARTAN (  COZAAR) 25 MG TABLET    Take 25 mg by mouth at bedtime.   MULTIVITAMIN (RENA-VIT) TABS TABLET    Take 1 tablet by mouth at bedtime.   TRAMADOL (ULTRAM) 50 MG TABLET    Take 50 mg by mouth every 6 (six) hours as needed for pain (pain).  Modified Medications   No medications on file  Discontinued Medications   No medications on file     Physical Exam: Filed Vitals:   08/14/13 1052  BP: 140/85  Pulse: 77  Temp: 97.3 F (36.3 C)  Resp: 18  SpO2: 98%   Physical  Exam  Constitutional: He is oriented to person, place, and time. No distress.  Thin black male  HENT:  Head: Normocephalic and atraumatic.  Right Ear: External ear normal.  Left Ear: External ear normal.  Nose: Nose normal.  Mouth/Throat: Oropharynx is clear and moist.  Eyes: Conjunctivae and EOM are normal. Pupils are equal, round, and reactive to light.  Neck: Normal range of motion. Neck supple. No JVD present.  Cardiovascular: Normal rate, regular rhythm, normal heart sounds and intact distal pulses.   Pulmonary/Chest: Effort normal and breath sounds normal. No respiratory distress.  Abdominal: Soft. Bowel sounds are normal. He exhibits no distension. There is tenderness.  Musculoskeletal:  Tenderness over left hip and into left groin  Neurological: He is alert and oriented to person, place, and time.  Skin: Skin is warm and dry.  Psychiatric:  Flat affect     Labs reviewed: Basic Metabolic Panel:  Recent Labs  16/10/96 0951 08/03/13 1442 08/04/13 0354 08/06/13 1045  NA 139 134* 136 136  136  K 4.5 4.3 4.1 4.7  4.8  CL 97 94* 96 93*  93*  CO2 27 26 29 25  25   GLUCOSE 145* 141* 78 127*  121*  BUN 51* 43* 22 56*  57*  CREATININE 7.70* 7.24* 4.66* 8.52*  8.61*  CALCIUM 10.1 9.2 8.8 9.2  9.2  PHOS 3.7 2.1*  --  3.1  3.2   Liver Function Tests:  Recent Labs  07/31/13 1725 08/01/13 0951 08/03/13 1442 08/06/13 1045  AST 14  --  14  --   ALT 13  --  10  --   ALKPHOS 88  --  86  --   BILITOT 0.5  --  0.3  --   PROT 8.1  --  7.5  --   ALBUMIN 3.3* 3.0* 2.9* 3.1*  CBC:  Recent Labs  08/24/12 1507  07/31/13 1725  08/03/13 1442 08/04/13 0354 08/06/13 1059  WBC 11.4*  --  9.6  < > 9.3 9.2 10.0  NEUTROABS 7.6  --  6.1  --   --   --   --   HGB 10.8*  < > 10.1*  < > 9.8* 9.7* 9.8*  HCT 31.5*  < > 29.2*  < > 28.1* 28.3* 28.2*  MCV 78.0  --  75.6*  < > 75.1* 75.7* 74.2*  PLT 263  --  306  < > 301 309 339  < > = values in this interval not  displayed.  Imaging and Procedures: Dg Chest 2 View  07/15/2013  1. Small right pleural effusion with adjacent with suspected atelectasis. 2. Atelectasis or pulmonary nodule along the left hemidiaphragm. Followup chest radiography to ensure clearance, or chest CT, is recommended. 3. Mild cardiomegaly with pulmonary venous hypertension but without overt edema. 4. Acute or subacute right posterolateral 6th rib fracture.   Dg Lumbar Spine  2-3 Views  07/31/2013 1. Metastatic disease in the L2 and L5 bodies. 2. Pathologic L2 superior endplate fracture with mild compression.   Dg Hip Complete Left  07/15/2013  1. Suspected lucent/lytic expansile lesion of the lateral portion of the left superior pubic ramus with underlying pathologic fractures potentially extending into the quadrilateral plate of the acetabulum. Sclerotic lesion in the left inferior pubic ramus suspicious for metastatic lesion, potentially with internal stress fracture. Correlate with patient history of malignancy. Consider whole-body bone scan bone scan and/ or MRI for further characterization and to assess the rest of the skeleton. 2. Fragmented acetabular spurring versus labral degeneration.   Ct Chest W Contrast  07/31/2013 1. Bilateral enhancing renal masses suggesting either synchronous renal cell carcinoma or metastatic disease. 2. 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 3. Mediastinal adenopathy. 4. Pleural effusions, right greater than left. 5. Innumerable renal cysts suggesting polycystic kidney disease.   Ct Abdomen Pelvis W Contrast   1. Bilateral enhancing renal masses suggesting either synchronous renal cell carcinoma or metastatic disease. 2. 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 3. Mediastinal adenopathy. 4. Pleural effusions, right greater than left. 5. Innumerable renal cysts  suggesting polycystic kidney disease.   Mr Hip Left Wo Contrast  07/31/2013  1. Numerous destructive bone lesions, with the largest lesion destroying the left acetabulum, consistent with metastatic disease. 2. 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 This stenosis was successfully treated with balloon angioplasty.   Ct Biopsy  08/02/2013  CT-guided biopsy of the left pelvic soft tissue mass.   Dg Chest Portable 1 View  07/31/2013 Right base wedge-shaped consolidation. This may represent atelectasis. Pulmonary infarct or infiltrate not excluded. Pulmonary vascular prominence most notable centrally. Cardiomegaly. Tortuous aorta. The previously questioned nodule adjacent to the left hemidiaphragm is not delineated on the present exam. Recommend followup two view chest when able with attention to this region.   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 This stenosis was successfully treated with balloon angioplasty.   Assessment/Plan 1. Pathological fracture, with malunion, subsequent encounter -due to metastatic renal cell -receiving xrt for palliation -keep f/u appts for 10 total txs at [facility] -nonweightbearing to left hip  2. 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 Kathrynn Running, Dr Briant Cedar, and Dr. Rosie Fate -prognosis is poor, but patient is not ready for a palliative or hospice approach--he has great faith  -will adjust pain regimen to make him more comfortable  3. ESRD on hemodialysis -cont as planned  4. Anemia in chronic kidney disease -cont mgt as per nephrology  5. Tobacco abuse -was ongoing before admission, did not want nicoderm patch  6. Left hip pain -due to metastatic cancer--see above  7. Secondary hyperparathyroidism (of renal origin) -cont renavit, ca  carbonate, mvi  8. Hepatitis C -chronic, noted  9. Dialysis patient -cont HD t/r/s   Functional status:  Requiring some adl assist now due to nonweighbearing status and pain  Family/ staff Communication: discussed with pt himself and nursing staff  Labs/tests ordered:  Cbc, cmp next draw

## 2013-08-14 NOTE — Progress Notes (Signed)
[facility]    Radiation Oncology [address] [place]     Maryln Gottron, M.D. [place], [place] [address]               Billie Lade, M.D., Ph.D. Phone: [phone]      Molli Hazard A. Kathrynn Running, M.D. Fax: [phone]      Radene Gunning, M.D., Ph.D.         Lurline Hare, M.D.         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  Current Dose: 15 Gy  Current Fraction: 5  Planned Dose: 30 Gy   Narrative: [name] was seen today for weekly treatment management. The chart was checked and port films  were reviewed. He is tolerating his treatments well this time. He denies any nausea. His Pain is unchanged at this point.  He is receiving treatments to the lumbar spine left hip and right femur area.  Review of patient's allergies indicates no known allergies.  Current Outpatient Prescriptions  Medication Sig Dispense Refill  . amLODipine (NORVASC) 10 MG tablet Take 10 mg by mouth at bedtime.      . calcium carbonate (TUMS - DOSED IN MG ELEMENTAL CALCIUM) 500 MG chewable tablet Chew 3 tablets by mouth daily.      Marland Kitchen docusate sodium 100 MG CAPS Take 100 mg by mouth at bedtime.  10 capsule  0  . doxepin (SINEQUAN) 25 MG capsule Take 25 mg by mouth at bedtime.      Marland Kitchen HYDROcodone-acetaminophen (NORCO/VICODIN) 5-325 MG per tablet Take 2 tablets by mouth every 4 (four) hours as needed for moderate pain.  30 tablet  0  . losartan (COZAAR) 25 MG tablet Take 25 mg by mouth at bedtime.      . multivitamin (RENA-VIT) TABS tablet Take 1 tablet by mouth at bedtime.  30 tablet  0  . traMADol (ULTRAM) 50 MG tablet Take 50 mg by mouth every 6 (six) hours as needed for pain (pain).      Marland Kitchen albuterol (PROVENTIL) (5 MG/ML) 0.5%  nebulizer solution Take 0.5 mLs (2.5 mg total) by nebulization every 6 (six) hours as needed for wheezing or shortness of breath.  20 mL  12   No current facility-administered medications for this encounter.   Labs:  Lab Results  Component Value Date   WBC 10.0 08/06/2013   HGB 9.8* 08/06/2013   HCT 28.2* 08/06/2013   MCV 74.2* 08/06/2013   PLT 339 08/06/2013   Lab Results  Component Value Date   CREATININE 8.52* 08/06/2013   CREATININE 8.61* 08/06/2013   BUN 56* 08/06/2013   BUN 57* 08/06/2013   NA 136 08/06/2013   NA 136 08/06/2013   K 4.7 08/06/2013   K 4.8 08/06/2013   CL 93* 08/06/2013   CL 93* 08/06/2013   CO2 25 08/06/2013   CO2 25 08/06/2013   Lab Results  Component Value Date   ALT 10 08/03/2013   AST 14 08/03/2013   PHOS 3.1 08/06/2013   PHOS 3.2 08/06/2013   BILITOT 0.3 08/03/2013    Physical Examination:  weight is 121 lb 9.6 oz (  55.157 kg). His oral temperature is 97.7 F (36.5 C). His blood pressure is 128/79 and his pulse is 77. His respiration is 20.    Wt Readings from Last 3 Encounters:  08/14/13 121 lb 9.6 oz (55.157 kg)  08/08/13 123 lb 14.4 oz (56.2 kg)     Lungs - Normal respiratory effort, chest expands symmetrically. Lungs are clear to auscultation, no crackles or wheezes.  Heart has regular rhythm and rate  Abdomen is soft and non tender with normal bowel sounds  Assessment:  Patient tolerating treatments well  Plan: Continue treatment per original radiation prescription

## 2013-08-14 NOTE — Progress Notes (Signed)
Weekly rad txs, rt femur,l-spine,lt hip, in w/c,on Diaylsis Tues,Thurs, % Saturdays, pain mostly in back, "still can't walk, tired,fatigued, says he's eating okay, no c/o nausea, regular bowel movements, "I'm letting this be in God's hands"  Appetite good, tried to do patient education ,patient uninterested,sauid I know ll about that", discussed skin irritation,fatigue,pain, and stopped 2:32 PM

## 2013-08-15 ENCOUNTER — Ambulatory Visit: Payer: Medicare Other

## 2013-08-15 ENCOUNTER — Ambulatory Visit
Admit: 2013-08-15 | Discharge: 2013-08-15 | Disposition: A | Discharge status: Home / Self Care | Payer: Medicare Other | Attending: Radiation Oncology | Admitting: Radiation Oncology

## 2013-08-16 ENCOUNTER — Telehealth: Payer: Self-pay | Admitting: Internal Medicine

## 2013-08-16 ENCOUNTER — Ambulatory Visit: Payer: Medicare Other

## 2013-08-16 ENCOUNTER — Encounter: Payer: Self-pay | Admitting: Radiation Oncology

## 2013-08-16 ENCOUNTER — Ambulatory Visit
Admission: RE | Admit: 2013-08-16 | Discharge: 2013-08-16 | Disposition: A | Discharge status: Home / Self Care | Payer: Medicare Other | Source: Ambulatory Visit | Attending: Radiation Oncology | Admitting: Radiation Oncology

## 2013-08-16 ENCOUNTER — Ambulatory Visit
Admit: 2013-08-16 | Discharge: 2013-08-16 | Disposition: A | Discharge status: Home / Self Care | Payer: Medicare Other | Attending: Radiation Oncology | Admitting: Radiation Oncology

## 2013-08-16 VITALS — BP 143/72 | HR 74 | Temp 97.8°F | Resp 20

## 2013-08-16 DIAGNOSIS — C649 Malignant neoplasm of unspecified kidney, except renal pelvis: Secondary | ICD-10-CM

## 2013-08-16 NOTE — Progress Notes (Signed)
Weekly rad txs,  9 completed, rt femur, l-spine,l hip, pain an 8 from lying on table getting rad tx stated, patient gets diaylsis Tues,Thurs,& Sats, ofered fluids,declined, patient also declined to be weighed, unable to stand left leg, last pain med taken last night stated, encouraged patient may need to take a pain med right before he leaves Monday morning to come for his treatment,patient agreed, very weak 12:41 PM

## 2013-08-16 NOTE — Telephone Encounter (Signed)
C/D 08/16/13 for appt. 08/19/13

## 2013-08-16 NOTE — Progress Notes (Signed)
   Department of Radiation Oncology  Phone:  [phone] Fax:        [phone]  Weekly Treatment Note    Name: [name] Date: 08/16/2013 MRN: [id number] DOB: 1951/03/17   Current dose: 21 Gy  Current fraction: 7   MEDICATIONS: Current Outpatient Prescriptions  Medication Sig Dispense Refill  . albuterol (PROVENTIL) (5 MG/ML) 0.5% nebulizer solution Take 0.5 mLs (2.5 mg total) by nebulization every 6 (six) hours as needed for wheezing or shortness of breath.  20 mL  12  . amLODipine (NORVASC) 10 MG tablet Take 10 mg by mouth at bedtime.      . calcium carbonate (TUMS - DOSED IN MG ELEMENTAL CALCIUM) 500 MG chewable tablet Chew 3 tablets by mouth daily.      Marland Kitchen docusate sodium 100 MG CAPS Take 100 mg by mouth at bedtime.  10 capsule  0  . doxepin (SINEQUAN) 25 MG capsule Take 25 mg by mouth at bedtime.      Marland Kitchen HYDROcodone-acetaminophen (NORCO/VICODIN) 5-325 MG per tablet Take 2 tablets by mouth every 4 (four) hours as needed for moderate pain.  30 tablet  0  . losartan (COZAAR) 25 MG tablet Take 25 mg by mouth at bedtime.      . multivitamin (RENA-VIT) TABS tablet Take 1 tablet by mouth at bedtime.  30 tablet  0  . traMADol (ULTRAM) 50 MG tablet Take 50 mg by mouth every 6 (six) hours as needed for pain (pain).       No current facility-administered medications for this encounter.     ALLERGIES: Review of patient's allergies indicates no known allergies.   LABORATORY DATA:  Lab Results  Component Value Date   WBC 10.0 08/06/2013   HGB 9.8* 08/06/2013   HCT 28.2* 08/06/2013   MCV 74.2* 08/06/2013   PLT 339 08/06/2013   Lab Results  Component Value Date   NA 136 08/06/2013   NA 136 08/06/2013   K 4.7 08/06/2013   K 4.8 08/06/2013   CL 93* 08/06/2013   CL 93* 08/06/2013   CO2 25 08/06/2013   CO2 25 08/06/2013   Lab Results  Component Value Date   ALT 10 08/03/2013   AST 14 08/03/2013   ALKPHOS 86 08/03/2013   BILITOT 0.3 08/03/2013     NARRATIVE: [name] was  seen today for weekly treatment management. The chart was checked and the patient's films were reviewed. The patient states that he is doing fine with treatment. No nausea and no diarrhea. His pain is improved.  PHYSICAL EXAMINATION: oral temperature is 97.8 F (36.6 C). His blood pressure is 143/72 and his pulse is 74. His respiration is 20.        ASSESSMENT: The patient is doing satisfactorily with treatment.  PLAN: We will continue with the patient's radiation treatment as planned.

## 2013-08-18 ENCOUNTER — Other Ambulatory Visit: Payer: Self-pay | Admitting: Internal Medicine

## 2013-08-18 DIAGNOSIS — C649 Malignant neoplasm of unspecified kidney, except renal pelvis: Secondary | ICD-10-CM

## 2013-08-19 ENCOUNTER — Ambulatory Visit
Admit: 2013-08-19 | Discharge: 2013-08-19 | Disposition: A | Discharge status: Home / Self Care | Payer: Medicare Other | Attending: Radiation Oncology | Admitting: Radiation Oncology

## 2013-08-19 ENCOUNTER — Ambulatory Visit: Payer: Medicare Other

## 2013-08-19 ENCOUNTER — Ambulatory Visit (HOSPITAL_BASED_OUTPATIENT_CLINIC_OR_DEPARTMENT_OTHER): Payer: Medicare Other | Admitting: Internal Medicine

## 2013-08-19 ENCOUNTER — Telehealth: Payer: Self-pay | Admitting: Internal Medicine

## 2013-08-19 ENCOUNTER — Encounter: Payer: Self-pay | Admitting: Medical Oncology

## 2013-08-19 ENCOUNTER — Telehealth: Payer: Self-pay | Admitting: Medical Oncology

## 2013-08-19 ENCOUNTER — Encounter: Payer: Self-pay | Admitting: Internal Medicine

## 2013-08-19 VITALS — BP 123/67 | HR 78 | Temp 98.4°F | Resp 17 | Ht 67.0 in

## 2013-08-19 DIAGNOSIS — Z992 Dependence on renal dialysis: Secondary | ICD-10-CM

## 2013-08-19 DIAGNOSIS — B1920 Unspecified viral hepatitis C without hepatic coma: Secondary | ICD-10-CM

## 2013-08-19 DIAGNOSIS — C7951 Secondary malignant neoplasm of bone: Secondary | ICD-10-CM

## 2013-08-19 DIAGNOSIS — I10 Essential (primary) hypertension: Secondary | ICD-10-CM

## 2013-08-19 DIAGNOSIS — N189 Chronic kidney disease, unspecified: Secondary | ICD-10-CM

## 2013-08-19 DIAGNOSIS — M25552 Pain in left hip: Secondary | ICD-10-CM

## 2013-08-19 DIAGNOSIS — M25559 Pain in unspecified hip: Secondary | ICD-10-CM

## 2013-08-19 DIAGNOSIS — C649 Malignant neoplasm of unspecified kidney, except renal pelvis: Secondary | ICD-10-CM

## 2013-08-19 DIAGNOSIS — D631 Anemia in chronic kidney disease: Secondary | ICD-10-CM

## 2013-08-19 DIAGNOSIS — M8440XP Pathological fracture, unspecified site, subsequent encounter for fracture with malunion: Secondary | ICD-10-CM

## 2013-08-19 DIAGNOSIS — IMO0002 Reserved for concepts with insufficient information to code with codable children: Secondary | ICD-10-CM

## 2013-08-19 MED ORDER — OXYCODONE-ACETAMINOPHEN 5-325 MG PO TABS
ORAL_TABLET | ORAL | Status: AC
  Filled 2013-08-19: qty 2

## 2013-08-19 MED ORDER — SUNITINIB MALATE 50 MG PO CAPS: ORAL_CAPSULE | ORAL | Status: DC

## 2013-08-19 MED ORDER — OXYCODONE-ACETAMINOPHEN 5-325 MG PO TABS
2.0000 | ORAL_TABLET | Freq: Once | ORAL | Status: AC
  Administered 2013-08-19: 2 via ORAL

## 2013-08-19 NOTE — Telephone Encounter (Signed)
appts made per 11/17 POF Sister picked up cal for Dec stating pt in home shh

## 2013-08-19 NOTE — Progress Notes (Signed)
[facility] OFFICE PROGRESS NOTE  MATTINGLY,[name], MD [address] 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 - Plan: oxyCODONE-acetaminophen (PERCOCET/ROXICET) 5-325 MG per tablet 2 tablet, SUNItinib (SUTENT) 50 MG capsule, CBC with Differential, Comprehensive metabolic panel, CBC with Differential in 1 month, Comprehensive metabolic panel, 2D Echocardiogram without contrast  Dialysis patient - Plan: oxyCODONE-acetaminophen (PERCOCET/ROXICET) 5-325 MG per tablet 2 tablet  Hepatitis C - Plan: oxyCODONE-acetaminophen (PERCOCET/ROXICET) 5-325 MG per tablet 2 tablet  Hypertension - Plan: oxyCODONE-acetaminophen (PERCOCET/ROXICET) 5-325 MG per tablet 2 tablet  Left hip pain - Plan: oxyCODONE-acetaminophen (PERCOCET/ROXICET) 5-325 MG per tablet 2 tablet  Anemia in chronic kidney disease - Plan: oxyCODONE-acetaminophen (PERCOCET/ROXICET) 5-325 MG per tablet 2 tablet  Pathological fracture, with malunion, subsequent encounter - Plan: oxyCODONE-acetaminophen (PERCOCET/ROXICET) 5-325 MG per tablet 2 tablet  Chief Complaint  Patient presents with  . metastatic renal cell carcinoma to the bone    CURRENT THERAPY: Palliative XRT to the left hip (2 week course planned, ending on 11/19)  INTERVAL HISTORY: [name] 62 y.o. male African American male smoker with multiple medical problems including ESRD on HD, asked to see for evaluation metastatic bone lesions of unknown primary which were determined to be secondary to clear cell renal cell carcinoma.   He was initially seen by me on 07/31/2013. He was admitted on 07/31/2013 after he had presented with worsening left hip and right rib pain Requiring outpatient workup. Of note, he had been seen by PCP/Orthopedics 2-3 weeks prior, at which time a chest x-ray and a hip x-ray demonstrated acute or subacute right rib fracture, and a suspected lucent/lytic expansile lesion of the lateral portion of the  left superior pubic ramus with underlying pathologic fractures potentially extending into the quadrilateral plate of the acetabulum. Sclerotic lesion in the left inferior pubic ramus suspicious for metastatic lesion, potentially with internal stress fracture. A questionable nodule adjacent to the left hemidiaphragm seen on the prior x-ray was not be stable on admission. MRI of the left hip without contrast on 07/31/2013 revealed a 6 x 6 x 5 cm in homogeneous destructive soft tissue mass destroying the anterior medial and anterior superior aspects of the left acetabulum and the lateral aspect of the left superior pubic ramus. The tumor protrudes into the pelvis, extending into the left internal obturator muscle. There is an expansile 2 cm mass in the posterior aspect of the L5 vertebral body slightly protruding into the spinal canal. A 2.5 cm destructive lesion in the posterior aspect of the left iliac bone adjacent to the superior aspect of the left sacroiliac joint, a 16 mm destructive lesion in the posterior lateral aspect of the proximal right femur just below the right greater trochanter and a 4 cm destructive lesion in the medullary canal of the proximal right femoral shaft are noted. Small bilateral hip effusions and extensive edema in the adjacent soft tissues around the left hip extending into the left side of the pelvis are seen.   CT of the chest abdomen and pelvis with contrast on 07/31/2013 demonstrated a hyperemic 2.3 cm right paratracheal lymph node, smaller enlarged precarinal, subcarinal, pretracheal, and prevascular lymph nodes, trace left small and right pleural effusions, a permeative lesion in the proximal right 9th rib with surrounding soft tissue and associated pathologic fracture. A Smaller lytic lesion with path fracture through lateral aspect right 6th rib along with Old healed left rib fractures are seen. Small subpleural blebs in both upper lobes are noted.  Liver, spleen, adrenal glands  are unremarkable. Innumerable cysts throughout both kidneys are seen. A 4.5 cm enhancing mass in the interpolar region of the left kidney and a 3.3 cm enhancing mass in the upper pole of the right kidney are noted. Renal veins are patent. No retroperitoneal adenopathy. Stomach, small bowel and colon are nondilated. Expansile lytic enhancing masses as above. Enlarged 14 mm right inguinal lymph node was observed. Trace pelvic ascites. No free air. PSA was 1.32, normal.   Ct of bone was obtained on 10/30 and pathology determined to be consistent with metastatic renal cell carcinoma. He was started on palliative XRT to the left hip/lumbar spine  by radiation oncology (Dr. Mitzi Hansen) and scheduled to complete a two-week course of therapy on 08/21/2013.   He has been given darbepoetin 100mg  on 08/01/2013 and 08/08/2013 along with ferric gluconate IV 62.5 mg on 10/30 and 11/06.    Today, he is accompanied by his sister [name] and his niece.  He reports that his pain is fairly controlled on his present regiment.  He continues to participate in rehab at his SNF.  He denies any fevers or chills or acute shortness of breath.   MEDICAL HISTORY: Past Medical History  Diagnosis Date  . Renal disorder   . Hypertension   . Dialysis patient   . Secondary hyperparathyroidism (of renal origin)     s/p parathyroidectomy  . Anemia due to chronic disease treated with erythropoietin   . 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    ALLERGIES:  has No Known Allergies.  MEDICATIONS: has a current medication  list which includes the following prescription(s): albuterol, amlodipine, calcium carbonate, dss, doxepin, hydrocodone-acetaminophen, losartan, multivitamin, tramadol, and sunitinib.  SURGICAL HISTORY:  Past Surgical History  Procedure Laterality Date  . Av fistula placement      REVIEW OF SYSTEMS:   Constitutional: Denies fevers, chills or abnormal weight loss Eyes: Denies blurriness of vision Ears, nose, mouth, throat, and face: Denies mucositis or sore throat Respiratory: Denies cough, dyspnea or wheezes Cardiovascular: Denies palpitation, chest discomfort or lower extremity swelling Gastrointestinal:  Denies nausea, heartburn or change in bowel habits Skin: Denies abnormal skin rashes Lymphatics: Denies new lymphadenopathy or easy bruising Neurological:Denies numbness, tingling or new weaknesses Behavioral/Psych: Mood is stable, no new changes  All other systems were reviewed with the patient and are negative.  PHYSICAL EXAMINATION: ECOG PERFORMANCE STATUS: 1 - Symptomatic but completely ambulatory  Blood pressure 123/67, pulse 78, temperature 98.4 F (36.9 C), temperature source Oral, resp. rate 17, height 5\' 7"  (1.702 m), weight 0 lb (0 kg), SpO2 100.00%.  GENERAL:alert, mild distress and comfortable; sitting in chair. Thin, chronically ill appearing SKIN: skin color, texture, turgor are normal, no rashes or significant lesions EYES: normal, Conjunctiva are pink and non-injected, sclera clear OROPHARYNX:no exudate, no erythema and lips, buccal mucosa, and tongue normal ; poor dentition.  NECK: supple, thyroid normal size, non-tender, without nodularity LYMPH:  no palpable lymphadenopathy in the cervical, axillary or supraclavicular LUNGS: clear to auscultation and percussion with normal breathing effort HEART: regular rate & rhythm and no murmurs and no lower extremity  edema ABDOMEN:abdomen soft, non-tender and normal bowel sounds Musculoskeletal:no cyanosis of digits and no  clubbing ; RUE with AV fistula with palpable thrill NEURO: alert & oriented x 3 with fluent speech,generalized weakness in lower extremities but moves all extremities.   Labs:  Lab Results  Component Value Date   WBC 10.0 08/06/2013   HGB 9.8* 08/06/2013   HCT 28.2* 08/06/2013   MCV 74.2* 08/06/2013   PLT 339 08/06/2013   NEUTROABS 6.1 07/31/2013      Chemistry      Component Value Date/Time   NA 136 08/06/2013 1045   NA 136 08/06/2013 1045   K 4.7 08/06/2013 1045   K 4.8 08/06/2013 1045   CL 93* 08/06/2013 1045   CL 93* 08/06/2013 1045   CO2 25 08/06/2013 1045   CO2 25 08/06/2013 1045   BUN 56* 08/06/2013 1045   BUN 57* 08/06/2013 1045   CREATININE 8.52* 08/06/2013 1045   CREATININE 8.61* 08/06/2013 1045      Component Value Date/Time   CALCIUM 9.2 08/06/2013 1045   CALCIUM 9.2 08/06/2013 1045   ALKPHOS 86 08/03/2013 1442   AST 14 08/03/2013 1442   ALT 10 08/03/2013 1442   BILITOT 0.3 08/03/2013 1442     Studies:  No results found.   RADIOGRAPHIC STUDIES: Dg Lumbar Spine 2-3 Views  07/31/2013   CLINICAL DATA:  Low back pain with recent metastatic diagnosis  EXAM: LUMBAR SPINE - 2-3 VIEW  COMPARISON:  Contemporaneously body CT.  FINDINGS: Extensive metastatic disease of the lumbar spine is under appreciated by radiography. There is a lytic lesion in the posterior L5 body and an extensive permeative lesion in the L2 body. There is a superior endplate fracture of L2 noted. Patchy sclerosis, present in the lower L2, upper L3, lower L4, and upper L5 bodies that is most likely degenerative in nature.  IMPRESSION: 1. Metastatic disease in the L2 and L5 bodies. 2. Pathologic L2 superior endplate fracture with mild compression.   Electronically Signed   By: Tiburcio Pea M.D.   On: 07/31/2013 22:23   Ct Chest W Contrast  (REVIEWED by me)  07/31/2013   CLINICAL DATA:  Bone lesions.  Renal failure.  EXAM: CT CHEST, ABDOMEN, AND PELVIS WITH CONTRAST  TECHNIQUE: Multidetector CT imaging of the  chest, abdomen and pelvis was performed following the standard protocol during bolus administration of intravenous contrast.  CONTRAST:  80mL OMNIPAQUE IOHEXOL 300 MG/ML  SOLN  COMPARISON:  07/11/2005  FINDINGS: CT CHEST FINDINGS  Hyperemic 2.3 cm right paratracheal lymph node. Smaller enlarged precarinal, subcarinal, pretracheal, and prevascular lymph nodes. Trace left small and right pleural effusions. There is a permeative lesion in the proximal right 9th rib with surrounding soft tissue and associated pathologic fracture. Smaller lytic lesion with path fracture through lateral aspect right 6th rib. Old healed left rib fractures. Small subpleural blebs in both upper lobes. There is dependent atelectasis in both lower lobes, right greater than left, with no discrete mass identified.  CT ABDOMEN AND PELVIS FINDINGS  Surgical clips in the gallbladder fossa. Unremarkable liver, spleen, adrenal glands. Innumerable cysts throughout both kidneys. 4.5 cm enhancing mass in the interpolar region of the left kidney. 3.3 cm enhancing mass in the upper pole of the right kidney. Renal veins are patent. No retroperitoneal adenopathy. Atheromatous nondilated abdominal aorta. Stomach, small bowel and colon are nondilated. Expansile lytic enhancing masses involve the superior left pubic ramus and anterior acetabulum, L2 with pathologic fracture, L5 vertebral body, posterior  right iliac bone, and intertrochanteric region of the right femur. Enlarged 14 mm right inguinal lymph node. Trace pelvic ascites. No free air.  IMPRESSION: 1. Bilateral enhancing renal masses suggesting either synchronous renal cell carcinoma or metastatic disease. 2. 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 3. Mediastinal adenopathy. 4. Pleural effusions, right greater than left. 5. Innumerable renal cysts suggesting polycystic kidney disease.   Electronically Signed   By: Oley Balm  M.D.   On: 07/31/2013 22:25   Ct Abdomen Pelvis W Contrast  (REVIEWED by me)  07/31/2013   CLINICAL DATA:  Bone lesions.  Renal failure.  EXAM: CT CHEST, ABDOMEN, AND PELVIS WITH CONTRAST  TECHNIQUE: Multidetector CT imaging of the chest, abdomen and pelvis was performed following the standard protocol during bolus administration of intravenous contrast.  CONTRAST:  80mL OMNIPAQUE IOHEXOL 300 MG/ML  SOLN  COMPARISON:  07/11/2005  FINDINGS: CT CHEST FINDINGS  Hyperemic 2.3 cm right paratracheal lymph node. Smaller enlarged precarinal, subcarinal, pretracheal, and prevascular lymph nodes. Trace left small and right pleural effusions. There is a permeative lesion in the proximal right 9th rib with surrounding soft tissue and associated pathologic fracture. Smaller lytic lesion with path fracture through lateral aspect right 6th rib. Old healed left rib fractures. Small subpleural blebs in both upper lobes. There is dependent atelectasis in both lower lobes, right greater than left, with no discrete mass identified.  CT ABDOMEN AND PELVIS FINDINGS  Surgical clips in the gallbladder fossa. Unremarkable liver, spleen, adrenal glands. Innumerable cysts throughout both kidneys. 4.5 cm enhancing mass in the interpolar region of the left kidney. 3.3 cm enhancing mass in the upper pole of the right kidney. Renal veins are patent. No retroperitoneal adenopathy. Atheromatous nondilated abdominal aorta. Stomach, small bowel and colon are nondilated. Expansile lytic enhancing masses involve the superior left pubic ramus and anterior acetabulum, L2 with pathologic fracture, L5 vertebral body, posterior right iliac bone, and intertrochanteric region of the right femur. Enlarged 14 mm right inguinal lymph node. Trace pelvic ascites. No free air.  IMPRESSION: 1. Bilateral enhancing renal masses suggesting either synchronous renal cell carcinoma or metastatic disease. 2. Lytic osseous metastatic disease involving right ribs, L2  with pathologic fracture, L5, pelvis, and right femur. 3. Mediastinal adenopathy. 4. Pleural effusions, right greater than left. 5. Innumerable renal cysts suggesting polycystic kidney disease.   Electronically Signed   By: Oley Balm M.D.   On: 07/31/2013 22:25   Mr Hip Left Wo Contrast (REVIEWED by me)  07/31/2013   CLINICAL DATA:  Severe left hip pain. Abnormal radiographs dated 07/15/2013  EXAM: MRI OF THE LEFT HIP WITHOUT CONTRAST  TECHNIQUE: Multiplanar, multisequence MR imaging was performed. No intravenous contrast was administered.  COMPARISON:  Radiographs dated 07/15/2013  FINDINGS: There is a 6 x 6 x 5 cm inhomogeneous destructive soft tissue mass destroying the anterior medial and anterior superior aspects of the left acetabulum and the lateral aspect of the left superior pubic ramus. The tumor protrudes into the pelvis. Tumor extends into the left internal obturator muscle.  There is an expansile 2 cm mass in the posterior aspect of the L5 vertebral body slightly protruding into the spinal canal. There is a 2.5 cm destructive lesion in the posterior aspect of the left iliac bone adjacent to the superior aspect of the left sacroiliac joint. There is a 16 mm destructive lesion in the posterior lateral aspect of the proximal right femur just below the  right greater trochanter. There is a 4 cm destructive lesion in the medullary canal of the proximal right femoral shaft.  The patient does have fairly severe arthritis of the superior aspect of the left femoral head with joint space narrowing and degenerative changes of the remaining portion of the left acetabulum. There small bilateral hip effusions. There is extensive edema in the adjacent soft tissues around the left hip. The edema extends into the left side of the pelvis.  2.7 cm cyst in the right groin. This is of unknown etiology but appears benign.  IMPRESSION: 1. Numerous destructive bone lesions, with the largest lesion destroying the left  acetabulum, consistent with metastatic disease. 2. 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 3. Critical Value/emergent results were called by telephone at the time of interpretation on 07/31/2013 at 1:15 PM to Dr.KELLIE [facility] , who verbally acknowledged these results.   Electronically Signed   By: Geanie Cooley M.D.   On: 07/31/2013 13:25   Ir Pta Venous Right  08/03/2013   CLINICAL DATA:  62 year old with end-stage renal disease and right arm fistula. The patient has right arm swelling.  EXAM: RIGHT UPPER EXTREMITY FISTULOGRAM; CENTRAL VENOUS ANGIOPLASTY  Physician: Rachelle Hora. Lowella Dandy, MD  MEDICATIONS AND MEDICAL HISTORY: Fentanyl 50 mcg.  FLUOROSCOPY TIME:  2 min and 48 seconds  PROCEDURE: The procedure was explained to the patient. The risks and benefits of the procedure were discussed and the patient's questions were addressed. Informed consent was obtained from the patient. Right upper extremity fistula was accessed with an angiocath. Fistulogram images were obtained. Arm was prepped and draped in a sterile fashion. Maximal barrier sterile technique was utilized including caps, mask, sterile gowns, sterile gloves, sterile drape, hand hygiene and skin antiseptic. The skin was anesthetized with 1% lidocaine. Angiocath was exchanged for a 7-French vascular sheath over a Bentson wire. A 5 [place] Kumpe catheter was easily advanced into the right central veins. Additional angiography was performed. A Bentson wire was easily advanced into the superior vena cava and the inferior vena cava. A 8 mm x 40 mm Conquest balloon was positioned at the junction of the right innominate vein and SVC. The balloon was inflated without complication. A significant waist formation was noted. Follow up angiography demonstrated improved Waller in this area. This focal stenosis was treated a second time with a 10 mm x 40 mm balloon. This balloon was inflated for 2 minutes. The patient complained of some chest discomfort during this  balloon inflation. Follow up angiography was performed. The wire was removed. The catheter was removed with a pursestring suture.  FINDINGS: The right upper arm fistula is patent. There was a focal critical stenosis at the junction of the right innominate vein and SVC. There were large collateral vessels in the right upper chest and neck region. The post angioplasty images demonstrated markedly improved Waller through the right innominate vein. There was still some filling of the collateral vessels in the right upper chest and neck.  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 This stenosis was successfully treated with balloon angioplasty.   Electronically Signed   By: Richarda Overlie M.D.   On: 08/03/2013 12:38   Ct Biopsy  08/02/2013   CLINICAL DATA:  62 year old with renal lesions and multiple bone lesions. Tissue diagnosis is needed.  EXAM: CT BIOPSY OF LEFT PELVIC SOFT TISSUE MASS.  Physician: Rachelle Hora. Henn, MD  MEDICATIONS: Versed 2 mg, fentanyl 75 mcg. A radiology  nurse monitored the patient for moderate sedation.  ANESTHESIA/SEDATION: Moderate sedation time: 17 min  PROCEDURE: The procedure was explained to the patient. The risks and benefits of the procedure were discussed and the patient's questions were addressed. Informed consent was obtained from the patient. The patient was placed supine on the CT scanner. Images through the pelvis were obtained. The lesion in the anterior left hip was identified. The left anterior pelvis was prepped and draped in a sterile fashion. Skin was anesthetized with 1% lidocaine. Using CT guidance, a 17 gauge needle was directed into the pelvic soft tissue mass. Needle position was confirmed with CT. Three core biopsies were obtained with an 18 gauge device. Specimens were placed in formalin.  COMPLICATIONS: None  FINDINGS: There is a destructive soft tissue mass involving the anterior aspect of the left iliac bone and the left  acetabulum. Needle position was confirmed within the soft tissue lesion.  IMPRESSION: CT-guided biopsy of the left pelvic soft tissue mass.   Electronically Signed   By: Richarda Overlie M.D.   On: 08/02/2013 17:07   Dg Chest Portable 1 View  07/31/2013   CLINICAL DATA:  The shortness breath. DVT.  EXAM: PORTABLE CHEST - 1 VIEW  COMPARISON:  07/15/2013 and 12/02/2009  FINDINGS: Right base wedge-shaped consolidation. This may represent atelectasis. Pulmonary infarct or infiltrate not excluded. Recommend followup until clearance.  Elevated right hemidiaphragm. This limits evaluation of the right lung base.  Pulmonary vascular prominence most notable centrally.  Cardiomegaly.  Tortuous aorta.  The previously questioned nodule adjacent to the left hemidiaphragm is not delineated on the present exam. Recommend followup two view chest when able with attention to this region.  IMPRESSION: Right base wedge-shaped consolidation. This may represent atelectasis. Pulmonary infarct or infiltrate not excluded.  Pulmonary vascular prominence most notable centrally.  Cardiomegaly.  Tortuous aorta.  The previously questioned nodule adjacent to the left hemidiaphragm is not delineated on the present exam. Recommend followup two view chest when able with attention to this region.   Electronically Signed   By: Bridgett Larsson M.D.   On: 07/31/2013 17:52   Ir Shuntogram/ Fistulagram Right Mod Sed  08/03/2013   CLINICAL DATA:  62 year old with end-stage renal disease and right arm fistula. The patient has right arm swelling.  EXAM: RIGHT UPPER EXTREMITY FISTULOGRAM; CENTRAL VENOUS ANGIOPLASTY  Physician: Rachelle Hora. Lowella Dandy, MD  MEDICATIONS AND MEDICAL HISTORY: Fentanyl 50 mcg.  FLUOROSCOPY TIME:  2 min and 48 seconds  PROCEDURE: The procedure was explained to the patient. The risks and benefits of the procedure were discussed and the patient's questions were addressed. Informed consent was obtained from the patient. Right upper extremity fistula  was accessed with an angiocath. Fistulogram images were obtained. Arm was prepped and draped in a sterile fashion. Maximal barrier sterile technique was utilized including caps, mask, sterile gowns, sterile gloves, sterile drape, hand hygiene and skin antiseptic. The skin was anesthetized with 1% lidocaine. Angiocath was exchanged for a 7-French vascular sheath over a Bentson wire. A 5 [place] Kumpe catheter was easily advanced into the right central veins. Additional angiography was performed. A Bentson wire was easily advanced into the superior vena cava and the inferior vena cava. A 8 mm x 40 mm Conquest balloon was positioned at the junction of the right innominate vein and SVC. The balloon was inflated without complication. A significant waist formation was noted. Follow up angiography demonstrated improved Waller in this area. This focal stenosis was treated a second time with  a 10 mm x 40 mm balloon. This balloon was inflated for 2 minutes. The patient complained of some chest discomfort during this balloon inflation. Follow up angiography was performed. The wire was removed. The catheter was removed with a pursestring suture.  FINDINGS: The right upper arm fistula is patent. There was a focal critical stenosis at the junction of the right innominate vein and SVC. There were large collateral vessels in the right upper chest and neck region. The post angioplasty images demonstrated markedly improved Waller through the right innominate vein. There was still some filling of the collateral vessels in the right upper chest and neck.  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 This stenosis was successfully treated with balloon angioplasty.   Electronically Signed   By: Richarda Overlie M.D.   On: 08/03/2013 12:38   PATHOLOGY: Soft Tissue Needle Core Biopsy, left anterior pelvis, acetabular - METASTATIC RENAL CELL CARCINOMA, CLEAR CELL TYPE, SEE COMMENT. Microscopic  Comment The history of "renal lesions" is noted. The biopsy findings demonstrate clear cell neoplasm with morphologic features consistent with metastatic renal cell carcinoma, clear cell type. The case was reviewed with Dr. Dierdre Searles who concurs. (CRR:gt, 08/05/13) [place] RUND DO Pathologist, Electronic Signature (Case signed 08/05/2013)  ASSESSMENT: [name] 62 y.o. 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 - Plan: oxyCODONE-acetaminophen (PERCOCET/ROXICET) 5-325 MG per tablet 2 tablet, SUNItinib (SUTENT) 50 MG capsule, CBC with Differential, Comprehensive metabolic panel, CBC with Differential in 1 month, Comprehensive metabolic panel, 2D Echocardiogram without contrast  Dialysis patient - Plan: oxyCODONE-acetaminophen (PERCOCET/ROXICET) 5-325 MG per tablet 2 tablet  Hepatitis C - Plan: oxyCODONE-acetaminophen (PERCOCET/ROXICET) 5-325 MG per tablet 2 tablet  Hypertension - Plan: oxyCODONE-acetaminophen (PERCOCET/ROXICET) 5-325 MG per tablet 2 tablet  Left hip pain - Plan: oxyCODONE-acetaminophen (PERCOCET/ROXICET) 5-325 MG per tablet 2 tablet  Anemia in chronic kidney disease - Plan: oxyCODONE-acetaminophen (PERCOCET/ROXICET) 5-325 MG per tablet 2 tablet  Pathological fracture, with malunion, subsequent encounter - Plan: oxyCODONE-acetaminophen (PERCOCET/ROXICET) 5-325 MG per tablet 2 tablet   PLAN:  1. 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  Risk factors are longstanding hypertension, heavier smoker, complex kidney mass although not the typical finding on imaging with 30-40 % presenting with bone metastases.  -- We reviewed extensively the patients pathology and imaging in detail and explained that it is consistent with metastatic renal cell carcinoma which is not curable but treatable.   We then discussed treatment options include Palliative therapy with radiation and/or chemotherapy, i.e., sutent versus best supportive care.  Given his [name], he is  unlikely to be a candidate for clinical trial.   We then discussed starting chemotherapy to control the spread of his disease with sutent or sunitinib at 50 mg on days 1-28 of 42 day treatment cycle.   Given his H/D we would consider increasing subsequent doses gradually to 2x usual dose based on tolerability and safety.  We explained the possible side-effects included but was not limited to MI, CHF, QT prolongation, severe Hypertensive crisis, thromboembolism, hepatotoxicity, hypothyroidism, neutropenia, lymphopenia, thrombocytopenia, diarrhea, nausea/vomiting, skin discoloration, constipation, hand-foot syndrome, headache and back pain.  He understood these risks, and agreed to proceed with treatment.  Prior to starting we will obtain, TFTs, he is on H/D and anuric, echocardiogram given his history of hypertension and chronic renal disease.  Echocardiogram in 2006 demonstrated hypertrophy.  LFTs are within normal limits.   --Adjunctive bone therapy.  He is unlikely to  be a candidate for bisphosphonate therapy given ESRDz and/or poor dentition.  We will make referral to dentistry upon next visit.   2. ESRDz on H/D. --Continue hemodialysis per nephrology with epo.  3. Hypertension. --Continue amlodipine 10 mg, losartan 25 mg daily.   4. Anemia in chronic kidney disease -- Continue epo and RBC transfusion prn symptoms.  5. Pathological fracture of Left hip secondary to #1.   --Will complete XRT on 08/21/2013.  Continue pain control with hydrocodone-acetaminophen 5-325 mg (two tabs q 4 hours prn moderate pain) and tramadol 50 q 6 hours prn pain.   May add long-acting if this is not enough.   6. Follow-up.  --He will follow up for labs in 2 weeks.   Prescription for sutent provided to his nursing facility.  He will require a baseline echocardiogram and LFTs (already obtained), TFTs.   He will follow up for a symptom check in 4 weeks with labs including chemistries and CBC.    All questions were  answered. The patient knows to call the clinic with any problems, questions or concerns. We can certainly see the patient much sooner if necessary.  I spent 25 minutes counseling the patient face to face. The total time spent in the appointment was 40 minutes.    Presten Joost, MD 08/20/2013 8:47 AM

## 2013-08-19 NOTE — Telephone Encounter (Signed)
I called [facility] and spoke with nurse regarding Sutent. I asked if they could supply the pt with the medication or would we need to order and have the pt's family bring in. I faxed prescription to [phone] and they will see if they can get the medication of the pt. If they are not able they will contact our office so we can send prescription to be filled and his sister can take it in for him.

## 2013-08-19 NOTE — Patient Instructions (Addendum)
Brand Names: [place]  Sutent Brand Names: [place]  Sutent Warning  .Very bad and sometimes deadly liver problems have happened with this drug. Call your doctor right away if you have signs of liver problems like dark urine, feeling tired, not hungry, upset stomach or stomach pain, light-colored stools, throwing up, or yellow skin or eyes. What is this drug used for?  .It is used to treat cancer. What do I need to tell my doctor BEFORE I take this drug?  .If you have an allergy to sunitinib or any other part of this drug.  .If you are allergic to any drugs like this one, any other drugs, foods, or other substances. Tell your doctor about the allergy and what signs you had, like rash; hives; itching; shortness of breath; wheezing; cough; swelling of face, lips, tongue, or throat; or any other signs.  .Do not take St John's wort with this drug. This drug may not work as well.  This drug may interact with other drugs or health problems.  Tell your doctor and pharmacist about all of your drugs (prescription or OTC, natural products, vitamins) and health problems. You must check to make sure that it is safe for you to take this drug with all of your drugs and health problems. Do not start, stop, or change the dose of any drug without checking with your doctor. What are some things I need to know or do while I take this drug?  .Tell dentists, surgeons, and other doctors that you use this drug.  .A very bad and sometimes deadly brain problem called posterior reversible encephalopathy syndrome (PRES) has happened with this drug. Talk with the doctor.  .Holes in the GI (gastrointestinal) tract may rarely happen.  .Very bad and sometimes deadly bleeding problems have happened with this drug. Talk with the doctor.  .Very bad jaw problems have happened with this drug. Have a dental exam before you start this drug. Take good care of your teeth. Talk with your doctor.  .High protein levels in the urine and kidney  problems have happened with this drug. Sometimes, these have been deadly. You will need to have your urine checked while taking this drug. Talk with the doctor.  .You may bleed more easily. Be careful and avoid injury. Use a soft toothbrush and an Neurosurgeon.  .If you have upset stomach, throwing up, loose stools (diarrhea), or are not hungry, talk with your doctor. There may be ways to lower these side effects.  .This drug may cause high blood pressure.  .Have your blood pressure checked often. Talk with your doctor.  .Have your blood work checked often. Talk with your doctor.  .Have an ECG checked often. Talk with your doctor.  Marland KitchenAvoid grapefruit and grapefruit juice.  .This drug may affect how wounds heal. If you need to have surgery, you may need to stop this drug before surgery. Start taking it again after surgery as you have been told by your doctor. Talk with your doctor.  .This drug may cause harm to the unborn baby if you take it while you are pregnant.  .Use birth control that you can trust to prevent pregnancy while taking this drug.  .Tell your doctor if you are pregnant or plan on getting pregnant. You will need to talk about the benefits and risks of using this drug while you are pregnant.  .Tell your doctor if you are breast-feeding. You will need to talk about any risks to your baby. What are some side  effects that I need to call my doctor about right away?  WARNING/CAUTION: Even though it may be rare, some people may have very bad and sometimes deadly side effects when taking a drug. Tell your doctor or get medical help right away if you have any of the following signs or symptoms that may be related to a very bad side effect:  .Signs of an allergic reaction, like rash; hives; itching; red, swollen, blistered, or peeling skin with or without fever; wheezing; tightness in the chest or throat; trouble breathing or talking; unusual hoarseness; or swelling of the mouth, face, lips,  tongue, or throat.  .Signs of bleeding like throwing up blood or throw up that looks like coffee grounds; coughing up blood; blood in the urine; black, red, or tarry stools; bleeding from the gums; vaginal bleeding that is not normal; bruises without a reason or that get bigger; or any bleeding that is very bad or that you cannot stop.  .Signs of a pancreas problem (pancreatitis) like very bad stomach pain, very bad back pain, or very bad upset stomach or throwing up.  .Signs of kidney problems like unable to pass urine, change in the amount of urine passed, blood in the urine, or a big weight gain.  .Change in color of skin.  .Swelling, warmth, or redness of the skin.  .Chest pain or pressure.  .Swelling, warmth, numbness, change of color, or pain in a leg or arm.  .A fast heartbeat.  .A heartbeat that does not feel normal.  .Very bad dizziness or passing out.  .Change in thinking clearly and with logic.  Marland KitchenChange in eyesight.  .Loss of eyesight.  .Seizures.  .Change in how you act.  .Very upset stomach or throwing up.  .Very loose stools (diarrhea).  .Jaw pain.  .Feeling very tired or weak.  .Not able to handle heat or cold.  .Very bad headache.  .Sweating a lot.  .Feeling nervous and excitable.  .A change in weight without trying.  .Redness or irritation of the palms of hands or soles of feet.  .Low mood (depression).  .Period (menstrual) changes.  Marland KitchenHeart failure has happened with this drug, as well as heart failure that has gotten worse in people who already have it. Tell your doctor if you have heart disease. Call your doctor right away if you have shortness of breath, a big weight gain, a heartbeat that is not normal, or swelling in the arms or legs that is new or worse.  .Patients with cancer who take this drug may be at greater risk of getting a bad and sometimes deadly health problem called tumor lysis syndrome (TLS). Call your doctor right away if you have a fast heartbeat or a  heartbeat that does not feel normal; any passing out; trouble passing urine; muscle weakness or cramps; upset stomach, throwing up, loose stools or not able to eat; or feel sluggish.  .A very bad skin reaction (Stevens-Johnson syndrome/toxic epidermal necrolysis) may happen. It can cause very bad health problems that may not go away, and sometimes death. Get medical help right away if you have signs like red, swollen, blistered, or peeling skin (with or without fever); red or irritated eyes; or sores in your mouth, throat, nose, or eyes. What are some other side effects of this drug?  All drugs may cause side effects. However, many people have no side effects or only have minor side effects. Call your doctor or get medical help if any of these side effects or any  other side effects bother you or do not go away:  .Feeling tired or weak.  .Dizziness.  Marland KitchenUpset stomach or throwing up.  .Not hungry.  .Loose stools (diarrhea).  .Mouth irritation or mouth sores.  .Hard stools (constipation).  .Belly pain.  Marland KitchenHeadache.  .Change in color of hair.  .Hair loss.  .Dry skin.  .Itching.  .Not able to sleep.  .Change in taste.  .Dry mouth.  .Gas.  .Weight loss.  .Muscle or joint pain.  .Back pain.  These are not all of the side effects that may occur. If you have questions about side effects, call your doctor. Call your doctor for medical advice about side effects.  You may report side effects to your national health agency. How is this drug best taken?  Use this drug as ordered by your doctor. Read and follow the dosing on the label closely.  .Take as you have been told, even if you feel well.  .To gain the most benefit, do not miss doses.  .Take with or without food.  .Do not open the capsules. What do I do if I miss a dose?  Marland KitchenTake a missed dose as soon as you think about it.  .If it is close to the time for your next dose, skip the missed dose and go back to your normal time.  .Do not take 2  doses at the same time or extra doses. How do I store and/or throw out this drug?  .Store at room temperature.  .Store in a dry place. Do not store in a bathroom.  Marland KitchenKeep all drugs out of the reach of children and pets.  .Check with your pharmacist about how to throw out unused drugs.  General drug facts  .If your symptoms or health problems do not get better or if they become worse, call your doctor.  .Do not share your drugs with others and do not take anyone else's drugs.  Marland KitchenKeep a list of all your drugs (prescription, natural products, vitamins, OTC) with you. Give this list to your doctor.  .Talk with the doctor before starting any new drug, including prescription or OTC, natural products, or vitamins.  .Some drugs may have another patient information leaflet. If you have any questions about this drug, please talk with your doctor, pharmacist, or other health care provider.  .If you think there has been an overdose, call your poison control center or get medical care right away. Be ready to tell or show what was taken, how much, and when it happened.   Sunitinib oral capsules What is this medicine? SUNITINIB (soo NI ti nib) is a chemotherapy drug. It targets a specific protein within cancer cells and stops the cancer cells from growing. It is used to treat specific digestive tract tumors called GISTs, advanced kidney cancer, and certain pancreatic neuroendocrine tumors. This medicine may be used for other purposes; ask your health care provider or pharmacist if you have questions. COMMON BRAND NAME(S): Sutent What should I tell my health care provider before I take this medicine? They need to know if you have any of these conditions: -bleeding problems -dental disease -infection (especially a virus infection such as chickenpox, cold sores, or herpes) -heart disease -heart failure -high blood pressure -kidney disease (other than cancer) -liver disease -lung disease -seizures -an  unusual or allergic reaction to sunitinib, other medicines, foods, dyes, or preservatives -pregnant or trying to get pregnant -breast-feeding How should I use this medicine? Take this medicine by mouth with a glass of  water. Follow the directions on the prescription label. You can take it with or without food. Take your medicine at regular intervals. Do not take your medicine more often than directed. Do not stop taking except on your doctor's advice. Talk to your pediatrician regarding the use of this medicine in children. Special care may be needed. Overdosage: If you think you have taken too much of this medicine contact a poison control center or emergency room at once. NOTE: This medicine is only for you. Do not share this medicine with others. What if I miss a dose? If you miss a dose, take it as soon as you can. If it is almost time for your next dose, take only that dose. Do not take double or extra doses. Tell your doctor if you miss a dose. What may interact with this medicine? Do not take this medicine with any of the following medications: -cisapride -grapefruit juice -pimozide -St. John's Wort -thioridazine This medicine may also interact with the following medications: -antiviral medicines for HIV or AIDS -barbiturates for sleep or seizures -carbamazepine -dexamethasone -medicines for depression, anxiety, or psychotic disturbances -medicines for fungal infections like ketoconazole and itraconazole -medicines for irregular heart beat like amiodarone, bepridil, dofetilide, encainide, flecainide, propafenone, quinidine -medicines for numbness or sleep during surgery -phenobarbital -phenytoin -rifabutin, rifampin, rifapentine -some antibiotics like clarithromycin, erythromycin, levofloxacin, mefloquine, telithromycin This list may not describe all possible interactions. Give your health care provider a list of all the medicines, herbs, non-prescription drugs, or dietary  supplements you use. Also tell them if you smoke, drink alcohol, or use illegal drugs. Some items may interact with your medicine. What should I watch for while using this medicine? Visit your doctor for regular check ups. Talk to your doctor about any new or unusual health problems. You will need blood work done while you are taking this medicine. If you have any dental work done, tell your dentist you are receiving this medicine. Do not become pregnant while taking this medicine. Male and male patients should use effective birth control methods while taking this medicine. Women should inform their doctor if they wish to become pregnant or think they might be pregnant. There is a potential for serious side effects to an unborn child. Talk to your health care professional or pharmacist for more information. Do not breast-feed an infant while taking this medicine. What side effects may I notice from receiving this medicine? Side effects that you should report to your doctor or health care professional as soon as possible: -allergic reactions like skin rash, itching or hives, swelling of the face, lips, or tongue -breathing problems -dark urine -feeling faint or lightheaded, falls -fever or chills, cough, sore throat -high blood pressure -jaw pain, especially after dental work -mouth sores -seizures -stomach pain -swelling of feet, legs -trouble passing urine or change in the amount of urine -unusual bleeding or bruising -unusually weak or tired Side effects that usually do not require medical attention (report to your doctor or health care professional if they continue or are bothersome): -bone or muscle pain -change in hair color (lighter) -changes in taste -diarrhea -loss of appetite -nausea, vomiting -skin that is cracked, dry, thick, yellow or lightened -stomach upset This list may not describe all possible side effects. Call your doctor for medical advice about side effects. You  may report side effects to FDA at 1-800-FDA-1088. Where should I keep my medicine? Keep out of the reach of children. Store at room temperature between 15 and 30  degrees C (59 and 86 degrees F). Throw away any unused medicine after the expiration date. NOTE: This sheet is a summary. It may not cover all possible information. If you have questions about this medicine, talk to your doctor, pharmacist, or health care provider.  2014, [employer]vier/[employer]. (2011-01-27 11:21:55)   Bone Metastases Cancerous growths can begin in any part of the body. The original site of cancer is called the primary tumor or primary cancer (for example, breast cancer). After cancer has developed in one area of the body, cancerous cells from that area can break away and travel through the body's bloodstream. If these cancerous cells begin growing in another place in the body, they are called metastases. Bone metastases are cancer cells that have spread to the bone (which is different from a cancer that starts in the bone). These secondary growths are like the original tumor. For example, if a prostate cancer spreads to bone it is called metastatic prostate cancer, or prostate cancer metastatic to bone, but not bone cancer. Cancers can spread to almost any bone; the spine and pelvis are often involved.  Any type of cancer can spread to the bone, but the most common are breast, lung, kidney, thyroid and prostate cancers. Sometimes the primary tumor is not discovered until there are bone problems. If the primary cancer location cannot be discovered, the cancer is called cancer of unknown primary location. SYMPTOMS  Pain in the bones is the main symptom of bone metastases. Some other problems may occur first including:  Decreased appetite.  Nausea.  Muscle weakness.  Confusion.  Unusual sleep patterns due to discomfort.  Overly tired (fatigue).  Restlessness. Frail or brittle bones may lead to broken bones  (fractures) that lead to learning what is wrong (diagnosis). A tumor often weakens the bones.  DIAGNOSIS  Metastatic cancers may be found months or years after or at the same time as the primary tumor. When a second tumor is found in a patient who has been treated for cancer, it is more often a metastasis than another primary tumor.  The patient's symptoms, physical examination, X-rays and blood tests may suggest a bone metastases. In addition, an examination of tissue or a cell sample (biopsy) is usually done to find the cancer. This sample is removed with a needle. This tissue sample must be looked at under a microscope to confirm a diagnosis. TREATMENT  Options generally include treatments that give relief from symptoms (palliative) or curative. Those with advanced, metastasized cancer may receive treatment focused on pain relief and prolonging life. These treatments depend on the type of cancer and its location.  Treatment for cancer depends on its type and location. Some of these treatments are:  Surgery to remove the original tumor and/or to remove parts of the body that produce hormones and other chemicals that make cancer worse.  Treatment with drugs (chemotherapy).  Bone marrow transplantations on rare occasions.  Radiation therapy (radiotherapy).  Hormonal therapy.  Pain relieving medications. Your caregiver will help you understand the likelihood that any particular treatment will be helpful for you. While some treatments aim to cure or control the cancer, others give relief from symptoms only. If you have bone metastases, radiation therapy may be recommended to treat pain (if it is in one main location). Pain medications are available. These include strong medicines like morphine. You may be instructed to take a long-acting pain medication (to control most of your pain) and a short-acting medication to control occasional flares of  pain. Pain medication is sometimes also given  continuously through a pump. HOME CARE INSTRUCTIONS   Take medications exactly as prescribed.  Keep any follow-up appointments.  Pain medications can make you sleepy or confused. Do not drive, climb ladders, or do other dangerous activities while on pain medication.  Pain medications often cause constipation. Ask your caregiver for information on stool softeners.  Do not share your pain medication with others. SEEK MEDICAL CARE IF:   Your bone pain is not controlled.  You are having problems or side effects from your medication.  You have excessive sleepiness or confusion. SEEK IMMEDIATE MEDICAL CARE IF:   You fall and have any injury or pain from the fall.  You have trouble walking.  You have numbness or tingling in your legs.  You develop a sudden significant worsening of your pain. Document Released: 09/09/2002 Document Revised: 12/12/2011 Document Reviewed: 05/02/2008 [facility] Patient Information 2014 [place], [place]. Renal Cell Cancer Renal cell cancer (kidney cancer) is a disease in which cancer cells form in the linings of tubules of the kidney. A cancer is an uncontrolled growth of cells and can occur anywhere in the body. Your kidneys are the organs which filter your blood and keep it clean by getting rid of waste products from your body in your urine. Urine passes from the kidneys into the bladder through long tubes called ureters. The bladder stores the urine until it is passed from the body through the tube which drains the bladder to the outside (urethra). SYMPTOMS  Early in the disease there may be no problems but as the disease worsens some of the problems seen are:  Blood in the urine.  Belly (abdominal) pain.  Decreased red blood cells (anemia).  A swelling in the belly.  Loss of appetite and weight loss.  Fever from unknown causes. DIAGNOSIS   Your caregiver will do a physical exam. This means they check you over.  Laboratory work may show problems  (abnormalities) in the urine.  Plain X-rays and some specialized x-rays may be done. Some of these may include a CT scan. Sometimes an IVP (intravenous pyelogram) is done. In this test a dye is injected into a vein and pictures are then taken of the kidneys. The dye travels to the inside of the kidneys, ureters and bladder. Let your caregivers know if you are allergic to iodine or have had a past reaction to dyes used in X-rays. Other specialized x-rays sometimes taken are the MRI (magnetic resonance imaging) and PET scan (positron emission technology).  Angiography is sometimes done in which a dye is put into an artery leading to the kidney so the vessels surrounding the tumor or growth can be studied.  Your caregiver will explain the value of the various testing to you and why it is necessary and helpful. If some of the above tests show a tumor or growth, sometimes a needle biopsy is done to confirm this and find out what the growth is made of. A fine needle aspiration (FNA) is used to remove a sliver of tissue from the kidney. This is done by sticking a needle through your skin and into the kidney. A specialist in looking at cells under the microscope (pathologist) then looks at the biopsy to determine what is wrong. The pathologist will check for cancer cells. Usually the previous tests mentioned have already given your surgeon enough information to know if an operation is needed. TREATMENT  You will want to discuss treatment choices with your  caregivers and see what the best treatment for you is. This will depend on various factors including your age, other health problems and what stage your disease is in. All of this will play a part in your outcome. Some of the treatment choices are:  Surgery is the main treatment and chances of surviving without this are uncommon. Usually the entire kidney is removed if this is possible. This is called a radical nephrectomy. The surgeon removes your kidney, the  small gland on top of the kidney (adrenal gland) and the fat surrounding the kidney. You have another adrenal gland on the other side so removing one is not a problem. Sometimes a partial nephrectomy is done in people with one kidney or people with cancer on both sides. This may help to avoid use of an artificial kidney (dialysis) as only part of a kidney is needed to filter your blood.  Arterial embolization is another treatment. With this treatment a small catheter is threaded from your groin up into your kidney and material is injected into the artery supplying the tumor in the kidney. Without blood supply, the tumor dies off. Sometimes this procedure is used before kidney removal to cut down on blood loss.  Radiation therapy or x-ray therapy can be used if your health is poor and will not allow surgery. Some of the problems with radiation include fatigue, nausea, vomiting, and damage to skin and surrounding tissues.  Chemotherapy may be used. This is a treatment which uses cancer killing medications to fight the cancer. The side effects depend on the medications used. Some side effects may include nausea, vomiting, loss of weight, loss of appetite, hair loss and other problems. Your caregiver can usually give you medications to overcome most of the problems.  There are many other forms of treatment your caregivers can discuss with you. Together you can determine which treatment will be best for you.  The more you know when dealing with these problems, the more comfortable you will be. Talk over your treatment over with your loved ones. Get a second opinion if you feel it will be of help. Often your surgeon and other caregivers may recommend this for your own comfort or peace of mind. Document Released: 07/30/2004 Document Revised: 12/12/2011 Document Reviewed: 07/10/2008 [facility] Patient Information 2014 [place], [place].

## 2013-08-19 NOTE — Progress Notes (Signed)
Toniann Fail from [facility] [place] called requesting refills for prednisone and oxycodone liquid.  Pt was taking 0.54ml every 8 hours for pain but he had a lot of pain and had to take 0.72ml to 1ml every 2 hours. She request we refill the prednisone in Dr. Benjiman Core name. Prescriptions faxed.

## 2013-08-20 ENCOUNTER — Ambulatory Visit
Admit: 2013-08-20 | Discharge: 2013-08-20 | Disposition: A | Discharge status: Home / Self Care | Payer: Medicare Other | Attending: Radiation Oncology | Admitting: Radiation Oncology

## 2013-08-20 ENCOUNTER — Ambulatory Visit: Payer: Medicare Other

## 2013-08-21 ENCOUNTER — Encounter: Payer: Self-pay | Admitting: Radiation Oncology

## 2013-08-21 ENCOUNTER — Ambulatory Visit
Admission: RE | Admit: 2013-08-21 | Discharge: 2013-08-21 | Disposition: A | Discharge status: Home / Self Care | Payer: Medicare Other | Source: Ambulatory Visit | Attending: Radiation Oncology | Admitting: Radiation Oncology

## 2013-08-21 ENCOUNTER — Ambulatory Visit: Payer: Medicare Other

## 2013-08-21 VITALS — BP 137/83 | HR 78 | Temp 98.3°F | Ht 67.0 in

## 2013-08-21 DIAGNOSIS — C649 Malignant neoplasm of unspecified kidney, except renal pelvis: Secondary | ICD-10-CM

## 2013-08-21 NOTE — Progress Notes (Signed)
Weekly Management Note Current Dose:  30 Gy  Projected Dose: 30 Gy   Narrative:  The patient presents for routine under treatment assessment.  CBCT/MVCT images/Port film x-rays were reviewed.  The chart was checked. Finishes RT today. No pain. No nausea. Sutent ordered by Dr. Rosie Fate.   Physical Findings: Weight: . Unchanged. Alert and oriented.   Impression:  The patient finishes RT today.   Plan:  Follow up in 1 month. Has follow up scheduled with Dr. Rosie Fate which I reminded him about.

## 2013-08-21 NOTE — Progress Notes (Signed)
[name] here in a wheelchair for final treatment visit. He has had 10 fractions to his lumbar spine and left hip.  He has also been treated on his right femur for 9 fractions.  He denies pain today.  He does have a few questions about chemotherapy and if the cancer will spread from his left hip.  He is fatigued.  He reports and improvement in his appetite.  He refused to be weighed today.  He reports that his skin in the treatment areas in intact.

## 2013-08-22 ENCOUNTER — Encounter: Payer: Self-pay | Admitting: Internal Medicine

## 2013-08-22 ENCOUNTER — Telehealth: Payer: Self-pay | Admitting: Medical Oncology

## 2013-08-22 NOTE — Progress Notes (Signed)
[name], [id number], approved sutent from 05/24/13-10/03/23 [id number]

## 2013-08-22 NOTE — Telephone Encounter (Signed)
Received a call from [facility] nurse to let us know that the sutent will cost them $6000.00 a month. They will not be able to supply this for the patient. If we are able to get his medication for the patient they will allow the daughter to bring to the pharmacy and they will dispense to the pt. I gave the prescription to [facility] in managed care. Dr. Rosie Fate notified.

## 2013-08-22 NOTE — Progress Notes (Signed)
Faxed sutent prescription to [facility]. °

## 2013-08-22 NOTE — Telephone Encounter (Signed)
I called [facility] and spoke with pt's nurse Jasmine December to see if they were able to get sutent for the pt. She states she sees it on his record but she is not sure if pt has gotten it. She is going to check with their pharmacist and give me a call back. I explained if they are not able to get the medication we need to get it so pt's daughter can bring it in. She voiced understanding.

## 2013-08-23 ENCOUNTER — Telehealth: Payer: Self-pay | Admitting: Medical Oncology

## 2013-08-23 NOTE — Telephone Encounter (Signed)
I called pt's sister Marta Lamas to see if she has heard anything from [facility] regarding Sutent. She states they have called her and it is ready to be pick up. She is currently in route to pick it up and deliver to [facility]. I explained that it was too expensive for the nursing center to obtain for the pt. She is aware she will need to call for a refill when needed and continue to deliver as long as he is a patient. She voiced understanding.

## 2013-08-26 NOTE — Progress Notes (Signed)
  Radiation Oncology         [phone] ________________________________  Name: [name] MRN: [id number]  Date: 08/21/2013  DOB: [birth year]  End of Treatment Note  Diagnosis:   Metastatic adenocarcinoma     Indication for treatment:  Palliative       Radiation treatment dates:   08/08/2013 through 08/21/2013  Site/dose:   The patient was treated to 3 distinct areas corresponding to the lumbar spine from approximately L1-L5, the left hip/pelvis, in the proximal right femur. Each of these separate target areas were treated with 2 fields each. The target areas each received 30 gray in 10 fractions at 3 gray per fraction.  Narrative: The patient tolerated radiation treatment relatively well.   The patient did not exhibit any difficulties with acute toxicity during treatment.  Plan: The patient has completed radiation treatment. The patient will return to radiation oncology clinic for routine followup in one month. I advised the patient to call or return sooner if they have any questions or concerns related to their recovery or treatment. ________________________________  Radene Gunning, M.D., Ph.D.

## 2013-08-26 NOTE — Addendum Note (Signed)
Encounter addended by: Jonna Coup, MD on: 08/26/2013  4:49 PM<BR>     Documentation filed: Visit Diagnoses, Notes Section

## 2013-08-26 NOTE — Progress Notes (Signed)
  Radiation Oncology         [phone] ________________________________  Name: [name] MRN: [id number]  Date: 08/05/2013  DOB: 05/26/1951  SIMULATION AND TREATMENT PLANNING NOTE  DIAGNOSIS:  Metastatic adenocarcinoma with bony metastasis  NARRATIVE:  The patient was brought to the CT Simulation planning suite.  Identity was confirmed.  the patient will be treated to 3 separate target areas : The L-spine, the left pelvis/hip, and the right proximal femur. All relevant records and images related to the planned course of therapy were reviewed.   Written consent to proceed with treatment was confirmed which was freely given after reviewing the details related to the planned course of therapy had been reviewed with the patient.  Then, the patient was set-up in a stable reproducible  supine position for radiation therapy.  CT images were obtained.  Surface markings were placed.   A customized VAC lock bag was constructed to help with patient immobilization and this complex treatment device will be used daily.  The CT images were loaded into the planning software.  Then the target and avoidance structures were contoured.  Treatment planning then occurred.  The radiation prescription was entered and confirmed.  A total of 6 complex treatment devices were fabricated which relate to the designed radiation treatment fields. Each of these customized fields/ complex treatment devices will be used on a daily basis during the radiation course. I have requested : Isodose Plan.   PLAN:  The patient will receive 30 Gy in 10 fractions to each of the 3 target areas.  ________________________________   Radene Gunning, MD, PhD

## 2013-09-02 ENCOUNTER — Other Ambulatory Visit: Payer: Medicare Other

## 2013-09-03 ENCOUNTER — Other Ambulatory Visit (HOSPITAL_BASED_OUTPATIENT_CLINIC_OR_DEPARTMENT_OTHER): Payer: Medicare Other | Admitting: Lab

## 2013-09-03 DIAGNOSIS — C649 Malignant neoplasm of unspecified kidney, except renal pelvis: Secondary | ICD-10-CM

## 2013-09-03 DIAGNOSIS — N189 Chronic kidney disease, unspecified: Secondary | ICD-10-CM

## 2013-09-03 DIAGNOSIS — C7951 Secondary malignant neoplasm of bone: Secondary | ICD-10-CM

## 2013-09-03 DIAGNOSIS — D631 Anemia in chronic kidney disease: Secondary | ICD-10-CM

## 2013-09-03 LAB — COMPREHENSIVE METABOLIC PANEL (CC13)
AST: 33 U/L (ref 5–34)
Albumin: 3 g/dL — ABNORMAL LOW (ref 3.5–5.0)
Alkaline Phosphatase: 92 U/L (ref 40–150)
Anion Gap: 13 mEq/L — ABNORMAL HIGH (ref 3–11)
BUN: 22.6 mg/dL (ref 7.0–26.0)
CO2: 29 mEq/L (ref 22–29)
Calcium: 9.8 mg/dL (ref 8.4–10.4)
Chloride: 98 mEq/L (ref 98–109)
Creatinine: 3.5 mg/dL (ref 0.7–1.3)
Glucose: 95 mg/dl (ref 70–140)
Potassium: 4 mEq/L (ref 3.5–5.1)

## 2013-09-03 LAB — CBC WITH DIFFERENTIAL/PLATELET
Basophils Absolute: 0 10*3/uL (ref 0.0–0.1)
EOS%: 2.3 % (ref 0.0–7.0)
Eosinophils Absolute: 0.1 10*3/uL (ref 0.0–0.5)
HGB: 11.3 g/dL — ABNORMAL LOW (ref 13.0–17.1)
LYMPH%: 13.2 % — ABNORMAL LOW (ref 14.0–49.0)
MONO#: 0.6 10*3/uL (ref 0.1–0.9)
NEUT#: 4.5 10*3/uL (ref 1.5–6.5)
NEUT%: 73.5 % (ref 39.0–75.0)
Platelets: 210 10*3/uL (ref 140–400)
RDW: 20.5 % — ABNORMAL HIGH (ref 11.0–14.6)
WBC: 6.1 10*3/uL (ref 4.0–10.3)
lymph#: 0.8 10*3/uL — ABNORMAL LOW (ref 0.9–3.3)

## 2013-09-11 ENCOUNTER — Non-Acute Institutional Stay (SKILLED_NURSING_FACILITY): Payer: Medicare Other | Admitting: Internal Medicine

## 2013-09-11 ENCOUNTER — Encounter: Payer: Self-pay | Admitting: Internal Medicine

## 2013-09-11 DIAGNOSIS — Z992 Dependence on renal dialysis: Secondary | ICD-10-CM

## 2013-09-11 DIAGNOSIS — N186 End stage renal disease: Secondary | ICD-10-CM

## 2013-09-11 DIAGNOSIS — M25559 Pain in unspecified hip: Secondary | ICD-10-CM

## 2013-09-11 DIAGNOSIS — N2581 Secondary hyperparathyroidism of renal origin: Secondary | ICD-10-CM

## 2013-09-11 DIAGNOSIS — C7951 Secondary malignant neoplasm of bone: Secondary | ICD-10-CM

## 2013-09-11 DIAGNOSIS — D631 Anemia in chronic kidney disease: Secondary | ICD-10-CM

## 2013-09-11 DIAGNOSIS — M25552 Pain in left hip: Secondary | ICD-10-CM

## 2013-09-11 DIAGNOSIS — K219 Gastro-esophageal reflux disease without esophagitis: Secondary | ICD-10-CM

## 2013-09-11 DIAGNOSIS — C649 Malignant neoplasm of unspecified kidney, except renal pelvis: Secondary | ICD-10-CM

## 2013-09-11 DIAGNOSIS — N189 Chronic kidney disease, unspecified: Secondary | ICD-10-CM

## 2013-09-11 NOTE — Progress Notes (Signed)
Patient ID: [name], male   DOB: 03-Apr-1951, 62 y.o.   MRN: [id number]  Location:  [facility] Provider:  Gwenith Spitz. Renato Gails, D.O., C.M.D.  Code Status:  Full code  Chief Complaint  Patient presents with  . Medical Managment of Chronic Issues    HPI:  62 yo male with h/o metastatic renal cell ca to the bone, eSRD on HD was seen for medical mgt of his chronic diseases.  He is here while undergoing palliative XRT to his left hip.  He notes pain but believes his faith will bring him through.  He does c/o acid reflux.    Review of Systems:  Review of Systems  Constitutional: Positive for malaise/fatigue.  Respiratory: Negative for shortness of breath.   Cardiovascular: Negative for chest pain.  Gastrointestinal: Positive for heartburn and nausea. Negative for vomiting and constipation.  Genitourinary: Negative for dysuria.  Musculoskeletal: Positive for joint pain and myalgias. Negative for falls.  Neurological: Positive for weakness. Negative for dizziness.  Endo/Heme/Allergies: Bruises/bleeds easily.  Psychiatric/Behavioral: Positive for depression.    Medications: Patient's Medications  New Prescriptions   No medications on file  Previous Medications   ALBUTEROL (PROVENTIL) (5 MG/ML) 0.5% NEBULIZER SOLUTION    Take 0.5 mLs (2.5 mg total) by nebulization every 6 (six) hours as needed for wheezing or shortness of breath.   AMLODIPINE (NORVASC) 10 MG TABLET    Take 10 mg by mouth at bedtime.   CALCIUM CARBONATE (TUMS - DOSED IN MG ELEMENTAL CALCIUM) 500 MG CHEWABLE TABLET    Chew 3 tablets by mouth daily.   DOCUSATE SODIUM 100 MG CAPS    Take 100 mg by mouth at bedtime.   DOXEPIN (SINEQUAN) 25 MG CAPSULE    Take 25 mg by mouth at bedtime.   HYDROCODONE-ACETAMINOPHEN (NORCO/VICODIN) 5-325 MG PER TABLET    Take 2 tablets by mouth every 4 (four) hours as needed for moderate pain.   LOSARTAN (COZAAR) 25 MG TABLET    Take 25 mg by mouth at bedtime.   MULTIVITAMIN  (RENA-VIT) TABS TABLET    Take 1 tablet by mouth at bedtime.   SUNITINIB (SUTENT) 50 MG CAPSULE    Take one tablet by mouth daily on days 1 - 28; then two weeks off for each cycle.   TRAMADOL (ULTRAM) 50 MG TABLET    Take 50 mg by mouth every 6 (six) hours as needed for pain (pain).  Modified Medications   No medications on file  Discontinued Medications   No medications on file    Physical Exam: Filed Vitals:   09/11/13 1429  BP: 151/80  Pulse: 79  Temp: 98.8 F (37.1 C)  Resp: 20  SpO2: 99%  Physical Exam  Constitutional:  Thin black male  Cardiovascular: Normal rate, regular rhythm, normal heart sounds and intact distal pulses.   Fistula with normal pulse and thrill  Pulmonary/Chest: Effort normal and breath sounds normal. No respiratory distress.  Abdominal: Soft. Bowel sounds are normal. He exhibits no distension and no mass. There is no tenderness.  Musculoskeletal:  Left hip tenderness  Neurological: He is alert.  Seems intermittently confused  Skin: Skin is warm.     Labs reviewed: Basic Metabolic Panel:  Recent Labs  78/29/56 0951 08/03/13 1442 08/04/13 0354 08/06/13 1045 09/03/13 1121  NA 139 134* 136 136  136 141  K 4.5 4.3 4.1 4.7  4.8 4.0  CL 97 94* 96 93*  93*  --   CO2 27 26  29 25  25 29   GLUCOSE 145* 141* 78 127*  121* 95  BUN 51* 43* 22 56*  57* 22.6  CREATININE 7.70* 7.24* 4.66* 8.52*  8.61* 3.5*  CALCIUM 10.1 9.2 8.8 9.2  9.2 9.8  PHOS 3.7 2.1*  --  3.1  3.2  --     Liver Function Tests:  Recent Labs  07/31/13 1725  08/03/13 1442 08/06/13 1045 09/03/13 1121  AST 14  --  14  --  33  ALT 13  --  10  --  44  ALKPHOS 88  --  86  --  92  BILITOT 0.5  --  0.3  --  0.57  PROT 8.1  --  7.5  --  7.6  ALBUMIN 3.3*  < > 2.9* 3.1* 3.0*  < > = values in this interval not displayed.  CBC:  Recent Labs  07/31/13 1725  08/04/13 0354 08/06/13 1059 09/03/13 1121  WBC 9.6  < > 9.2 10.0 6.1  NEUTROABS 6.1  --   --   --  4.5  HGB  10.1*  < > 9.7* 9.8* 11.3*  HCT 29.2*  < > 28.3* 28.2* 34.4*  MCV 75.6*  < > 75.7* 74.2* 79.9  PLT 306  < > 309 339 210  < > = values in this interval not displayed.  Assessment/Plan 1. GERD (gastroesophageal reflux disease) -will add zantac daily to his regimen -also discussed triggering foods and elevating the HOB 2. 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 ESRD on hemodialysis -continues tiw HD, no dyspnea or chest pain "as long as I go to HD" 4. Secondary hyperparathyroidism (of renal origin) -cont calcium, rena-vite, HD diet 5. Anemia in chronic kidney disease -cont treatments as per renal 6. Left hip pain -cont XRT per rad-onc -also getting hydrocodone/apap 2 tabs every 4 hrs as needed for pain--limit 3g apap per day  Family/ staff Communication: discussed with his nurse Goals of care: full code

## 2013-09-16 ENCOUNTER — Other Ambulatory Visit (HOSPITAL_BASED_OUTPATIENT_CLINIC_OR_DEPARTMENT_OTHER): Payer: Medicare Other

## 2013-09-16 ENCOUNTER — Telehealth: Payer: Self-pay | Admitting: Internal Medicine

## 2013-09-16 ENCOUNTER — Ambulatory Visit (HOSPITAL_BASED_OUTPATIENT_CLINIC_OR_DEPARTMENT_OTHER): Payer: Medicare Other | Admitting: Internal Medicine

## 2013-09-16 VITALS — BP 146/83 | HR 72 | Temp 98.6°F | Resp 18 | Ht 67.0 in | Wt 128.5 lb

## 2013-09-16 DIAGNOSIS — C7951 Secondary malignant neoplasm of bone: Secondary | ICD-10-CM

## 2013-09-16 DIAGNOSIS — B1920 Unspecified viral hepatitis C without hepatic coma: Secondary | ICD-10-CM

## 2013-09-16 DIAGNOSIS — I129 Hypertensive chronic kidney disease with stage 1 through stage 4 chronic kidney disease, or unspecified chronic kidney disease: Secondary | ICD-10-CM

## 2013-09-16 DIAGNOSIS — Z992 Dependence on renal dialysis: Secondary | ICD-10-CM

## 2013-09-16 DIAGNOSIS — N2581 Secondary hyperparathyroidism of renal origin: Secondary | ICD-10-CM

## 2013-09-16 DIAGNOSIS — C649 Malignant neoplasm of unspecified kidney, except renal pelvis: Secondary | ICD-10-CM

## 2013-09-16 DIAGNOSIS — M25552 Pain in left hip: Secondary | ICD-10-CM

## 2013-09-16 DIAGNOSIS — N186 End stage renal disease: Secondary | ICD-10-CM

## 2013-09-16 DIAGNOSIS — N189 Chronic kidney disease, unspecified: Secondary | ICD-10-CM

## 2013-09-16 DIAGNOSIS — M8440XP Pathological fracture, unspecified site, subsequent encounter for fracture with malunion: Secondary | ICD-10-CM

## 2013-09-16 DIAGNOSIS — D631 Anemia in chronic kidney disease: Secondary | ICD-10-CM

## 2013-09-16 DIAGNOSIS — M25559 Pain in unspecified hip: Secondary | ICD-10-CM

## 2013-09-16 LAB — COMPREHENSIVE METABOLIC PANEL (CC13)
ALT: 44 U/L (ref 0–55)
Alkaline Phosphatase: 100 U/L (ref 40–150)
Anion Gap: 13 mEq/L — ABNORMAL HIGH (ref 3–11)
CO2: 27 mEq/L (ref 22–29)
Calcium: 8.4 mg/dL (ref 8.4–10.4)
Chloride: 99 mEq/L (ref 98–109)
Creatinine: 7.6 mg/dL (ref 0.7–1.3)
Glucose: 116 mg/dl (ref 70–140)
Sodium: 139 mEq/L (ref 136–145)
Total Protein: 7 g/dL (ref 6.4–8.3)

## 2013-09-16 LAB — CBC WITH DIFFERENTIAL/PLATELET
Basophils Absolute: 0 10*3/uL (ref 0.0–0.1)
Eosinophils Absolute: 0.3 10*3/uL (ref 0.0–0.5)
HGB: 12.8 g/dL — ABNORMAL LOW (ref 13.0–17.1)
LYMPH%: 11.3 % — ABNORMAL LOW (ref 14.0–49.0)
MCHC: 33.9 g/dL (ref 32.0–36.0)
MCV: 77.5 fL — ABNORMAL LOW (ref 79.3–98.0)
MONO#: 0.3 10*3/uL (ref 0.1–0.9)
MONO%: 4.3 % (ref 0.0–14.0)
NEUT#: 6 10*3/uL (ref 1.5–6.5)
NEUT%: 80.5 % — ABNORMAL HIGH (ref 39.0–75.0)
Platelets: 112 10*3/uL — ABNORMAL LOW (ref 140–400)
RBC: 4.88 10*6/uL (ref 4.20–5.82)
WBC: 7.5 10*3/uL (ref 4.0–10.3)
nRBC: 0 % (ref 0–0)

## 2013-09-16 LAB — TECHNOLOGIST REVIEW

## 2013-09-16 NOTE — Patient Instructions (Signed)
Sunitinib oral capsules What is this medicine? SUNITINIB (soo NI ti nib) is a chemotherapy drug. It targets a specific protein within cancer cells and stops the cancer cells from growing. It is used to treat specific digestive tract tumors called GISTs, advanced kidney cancer, and certain pancreatic neuroendocrine tumors. This medicine may be used for other purposes; ask your health care provider or pharmacist if you have questions. COMMON BRAND NAME(S): Sutent What should I tell my health care provider before I take this medicine? They need to know if you have any of these conditions: -bleeding problems -dental disease -infection (especially a virus infection such as chickenpox, cold sores, or herpes) -heart disease -heart failure -high blood pressure -kidney disease (other than cancer) -liver disease -lung disease -seizures -an unusual or allergic reaction to sunitinib, other medicines, foods, dyes, or preservatives -pregnant or trying to get pregnant -breast-feeding How should I use this medicine? Take this medicine by mouth with a glass of water. Follow the directions on the prescription label. You can take it with or without food. Take your medicine at regular intervals. Do not take your medicine more often than directed. Do not stop taking except on your doctor's advice. Talk to your pediatrician regarding the use of this medicine in children. Special care may be needed. Overdosage: If you think you have taken too much of this medicine contact a poison control center or emergency room at once. NOTE: This medicine is only for you. Do not share this medicine with others. What if I miss a dose? If you miss a dose, take it as soon as you can. If it is almost time for your next dose, take only that dose. Do not take double or extra doses. Tell your doctor if you miss a dose. What may interact with this medicine? Do not take this medicine with any of the following  medications: -cisapride -grapefruit juice -pimozide -St. John's Wort -thioridazine This medicine may also interact with the following medications: -antiviral medicines for HIV or AIDS -barbiturates for sleep or seizures -carbamazepine -dexamethasone -medicines for depression, anxiety, or psychotic disturbances -medicines for fungal infections like ketoconazole and itraconazole -medicines for irregular heart beat like amiodarone, bepridil, dofetilide, encainide, flecainide, propafenone, quinidine -medicines for numbness or sleep during surgery -phenobarbital -phenytoin -rifabutin, rifampin, rifapentine -some antibiotics like clarithromycin, erythromycin, levofloxacin, mefloquine, telithromycin This list may not describe all possible interactions. Give your health care provider a list of all the medicines, herbs, non-prescription drugs, or dietary supplements you use. Also tell them if you smoke, drink alcohol, or use illegal drugs. Some items may interact with your medicine. What should I watch for while using this medicine? Visit your doctor for regular check ups. Talk to your doctor about any new or unusual health problems. You will need blood work done while you are taking this medicine. If you have any dental work done, tell your dentist you are receiving this medicine. Do not become pregnant while taking this medicine. Male and male patients should use effective birth control methods while taking this medicine. Women should inform their doctor if they wish to become pregnant or think they might be pregnant. There is a potential for serious side effects to an unborn child. Talk to your health care professional or pharmacist for more information. Do not breast-feed an infant while taking this medicine. What side effects may I notice from receiving this medicine? Side effects that you should report to your doctor or health care professional as soon as possible: -allergic reactions  like  skin rash, itching or hives, swelling of the face, lips, or tongue -breathing problems -dark urine -feeling faint or lightheaded, falls -fever or chills, cough, sore throat -high blood pressure -jaw pain, especially after dental work -mouth sores -seizures -stomach pain -swelling of feet, legs -trouble passing urine or change in the amount of urine -unusual bleeding or bruising -unusually weak or tired Side effects that usually do not require medical attention (report to your doctor or health care professional if they continue or are bothersome): -bone or muscle pain -change in hair color (lighter) -changes in taste -diarrhea -loss of appetite -nausea, vomiting -skin that is cracked, dry, thick, yellow or lightened -stomach upset This list may not describe all possible side effects. Call your doctor for medical advice about side effects. You may report side effects to FDA at 1-800-FDA-1088. Where should I keep my medicine? Keep out of the reach of children. Store at room temperature between 15 and 30 degrees C (59 and 86 degrees F). Throw away any unused medicine after the expiration date. NOTE: This sheet is a summary. It may not cover all possible information. If you have questions about this medicine, talk to your doctor, pharmacist, or health care provider.  2014, [employer]. (2011-01-27 11:21:55)

## 2013-09-16 NOTE — Telephone Encounter (Signed)
Gave pt appt for lab and MD on january 2015

## 2013-09-17 NOTE — Progress Notes (Signed)
[facility] OFFICE PROGRESS NOTE  MATTINGLY,[name], MD [address] 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  Started sutent on 08/24/2013.   INTERVAL HISTORY: [name] 62 y.o. male African American male smoker with multiple medical problems including ESRD on HD, asked to see for evaluation metastatic bone lesions of unknown primary which were determined to be secondary to clear cell renal cell carcinoma. He is here for follow-up.  He was last seen by me on 08/19/2013.  He was initially seen by me as a hospital consultation on 07/31/2013. He was admitted on 07/31/2013 after he had presented with worsening left hip and right rib pain Requiring outpatient workup. Of note, he had been seen by PCP/Orthopedics 2-3 weeks prior, at which time a chest x-ray and a hip x-ray demonstrated acute or subacute right rib fracture, and a suspected lucent/lytic expansile lesion of the lateral portion of the left superior pubic ramus with underlying pathologic fractures potentially extending into the quadrilateral plate of the acetabulum. Sclerotic lesion in the left inferior pubic ramus suspicious for metastatic lesion, potentially with internal stress fracture. A questionable nodule adjacent to the left hemidiaphragm seen on the prior x-ray was not be stable on admission. MRI of the left hip without contrast on 07/31/2013 revealed a 6 x 6 x 5  cm in homogeneous destructive soft tissue mass destroying the anterior medial and anterior superior aspects of the left acetabulum and the lateral aspect of the left superior pubic ramus. The tumor protrudes into the pelvis, extending into the left internal obturator muscle. There is an expansile 2 cm mass in the posterior aspect of the L5 vertebral body slightly protruding into the spinal canal. A 2.5 cm destructive lesion in the posterior aspect of the left iliac bone adjacent to the superior aspect of the left sacroiliac joint, a 16 mm destructive lesion in the posterior lateral aspect of the proximal right femur just below the right greater trochanter and a 4 cm destructive lesion in the medullary canal of the proximal right femoral shaft are noted. Small bilateral hip effusions and extensive edema in the adjacent soft tissues around the left hip extending into the left side of the pelvis are seen.   CT of the chest abdomen and pelvis with contrast on 07/31/2013 demonstrated a hyperemic 2.3 cm right paratracheal lymph node, smaller enlarged precarinal, subcarinal, pretracheal, and prevascular lymph nodes, trace left small and right pleural effusions, a permeative lesion in the proximal right 9th rib with surrounding soft tissue and associated pathologic fracture. A Smaller lytic lesion with path fracture through lateral aspect right 6th rib along with Old healed left rib fractures are seen. Small subpleural blebs in both upper lobes are noted. Liver, spleen, adrenal glands are unremarkable. Innumerable cysts throughout both kidneys are seen. A 4.5 cm enhancing mass in the interpolar region of the left kidney and a 3.3 cm enhancing mass in the upper pole of the right kidney are noted. Renal veins  are patent. No retroperitoneal adenopathy. Stomach, small bowel and colon are nondilated. Expansile lytic enhancing masses as above. Enlarged 14 mm right inguinal lymph node was observed. Trace pelvic ascites. No free  air. PSA was 1.32, normal.   Ct of bone was obtained on 10/30 and pathology determined to be consistent with metastatic renal cell carcinoma. He was started on palliative XRT to the left hip/lumbar spine  by radiation oncology (Dr. Mitzi Hansen) and scheduled to complete a two-week course of therapy on 08/21/2013.   He has been given darbepoetin 100mg  on 08/01/2013 and 08/08/2013 along with ferric gluconate IV 62.5 mg on 10/30 and 11/06.    Today, he comes alone from his nursing home [facility].  He reports that his pain is fairly controlled on his present regiment.  He continues to participate in rehab at his SNF.  He denies any fevers or chills or acute shortness of breath. He reports compliance to his sutent. He denies any abdominal pain, nausea/vomiting, diarrhea or rashes.   MEDICAL HISTORY: Past Medical History  Diagnosis Date  . Renal disorder   . Hypertension   . Dialysis patient   . Secondary hyperparathyroidism (of renal origin)     s/p parathyroidectomy  . Anemia due to chronic disease treated with erythropoietin   . 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    ALLERGIES:  has No Known Allergies.  MEDICATIONS: has a current medication list which includes the following prescription(s): albuterol, amlodipine, calcium carbonate, dss, doxepin, hydrocodone-acetaminophen, losartan, multivitamin, sunitinib, and tramadol.  SURGICAL HISTORY:  Past Surgical History  Procedure Laterality Date  . Av fistula placement      REVIEW OF SYSTEMS:   Constitutional:  Denies fevers, chills or abnormal weight loss Eyes: Denies blurriness of vision Ears, nose, mouth, throat, and face: Denies mucositis or sore throat Respiratory: Denies cough, dyspnea or wheezes Cardiovascular: Denies palpitation, chest discomfort or lower extremity swelling Gastrointestinal:  Denies nausea, heartburn or change in bowel habits Skin: Denies abnormal skin rashes Lymphatics: Denies new lymphadenopathy or easy bruising Neurological:Denies numbness, tingling or new weaknesses Behavioral/Psych: Mood is stable, no new changes  All other systems were reviewed with the patient and are negative.  PHYSICAL EXAMINATION: ECOG PERFORMANCE STATUS: 1 - Symptomatic but completely ambulatory  Blood pressure 146/83, pulse 72, temperature 98.6 F (37 C), temperature source Oral, resp. rate 18, height 5\' 7"  (1.702 m), weight 128 lb 8 oz (58.287 kg), SpO2 100.00%.  GENERAL:alert, mild distress and comfortable; sitting in chair. Thin, chronically ill appearing SKIN: skin color, texture, turgor are normal, no rashes or significant lesions EYES: normal, Conjunctiva are pink and non-injected, sclera clear OROPHARYNX:no exudate, no erythema and lips, buccal mucosa, and tongue normal ; poor dentition.  NECK: supple, thyroid normal size, non-tender, without nodularity LYMPH:  no palpable lymphadenopathy in the cervical, axillary or supraclavicular LUNGS: clear to auscultation and percussion with normal breathing effort HEART: regular rate & rhythm and no murmurs and no lower extremity edema ABDOMEN:abdomen soft, non-tender and normal bowel sounds Musculoskeletal:no cyanosis of digits and no clubbing ; RUE with AV fistula with palpable thrill NEURO: alert &  oriented x 3 with fluent speech,generalized weakness in lower extremities but moves all extremities.   Labs:  Lab Results  Component Value Date   WBC 7.5 09/16/2013   HGB 12.8* 09/16/2013   HCT 37.8* 09/16/2013   MCV 77.5* 09/16/2013   PLT  112* 09/16/2013   NEUTROABS 6.0 09/16/2013      Chemistry      Component Value Date/Time   NA 139 09/16/2013 0931   NA 136 08/06/2013 1045   NA 136 08/06/2013 1045   K 5.4* 09/16/2013 0931   K 4.7 08/06/2013 1045   K 4.8 08/06/2013 1045   CL 93* 08/06/2013 1045   CL 93* 08/06/2013 1045   CO2 27 09/16/2013 0931   CO2 25 08/06/2013 1045   CO2 25 08/06/2013 1045   BUN 49.0* 09/16/2013 0931   BUN 56* 08/06/2013 1045   BUN 57* 08/06/2013 1045   CREATININE 7.6* 09/16/2013 0931   CREATININE 8.52* 08/06/2013 1045   CREATININE 8.61* 08/06/2013 1045      Component Value Date/Time   CALCIUM 8.4 09/16/2013 0931   CALCIUM 9.2 08/06/2013 1045   CALCIUM 9.2 08/06/2013 1045   ALKPHOS 100 09/16/2013 0931   ALKPHOS 86 08/03/2013 1442   AST 33 09/16/2013 0931   AST 14 08/03/2013 1442   ALT 44 09/16/2013 0931   ALT 10 08/03/2013 1442   BILITOT 0.44 09/16/2013 0931   BILITOT 0.3 08/03/2013 1442     Studies:  No results found.   RADIOGRAPHIC STUDIES: Dg Lumbar Spine 2-3 Views  07/31/2013   CLINICAL DATA:  Low back pain with recent metastatic diagnosis  EXAM: LUMBAR SPINE - 2-3 VIEW  COMPARISON:  Contemporaneously body CT.  FINDINGS: Extensive metastatic disease of the lumbar spine is under appreciated by radiography. There is a lytic lesion in the posterior L5 body and an extensive permeative lesion in the L2 body. There is a superior endplate fracture of L2 noted. Patchy sclerosis, present in the lower L2, upper L3, lower L4, and upper L5 bodies that is most likely degenerative in nature.  IMPRESSION: 1. Metastatic disease in the L2 and L5 bodies. 2. Pathologic L2 superior endplate fracture with mild compression.   Electronically Signed   By: Tiburcio Pea M.D.   On: 07/31/2013 22:23   Ct Chest W Contrast  (REVIEWED by me last visit)  07/31/2013   CLINICAL DATA:  Bone lesions.  Renal failure.  EXAM: CT CHEST, ABDOMEN, AND PELVIS WITH CONTRAST  TECHNIQUE: Multidetector CT imaging of the chest, abdomen  and pelvis was performed following the standard protocol during bolus administration of intravenous contrast.  CONTRAST:  80mL OMNIPAQUE IOHEXOL 300 MG/ML  SOLN  COMPARISON:  07/11/2005  FINDINGS: CT CHEST FINDINGS  Hyperemic 2.3 cm right paratracheal lymph node. Smaller enlarged precarinal, subcarinal, pretracheal, and prevascular lymph nodes. Trace left small and right pleural effusions. There is a permeative lesion in the proximal right 9th rib with surrounding soft tissue and associated pathologic fracture. Smaller lytic lesion with path fracture through lateral aspect right 6th rib. Old healed left rib fractures. Small subpleural blebs in both upper lobes. There is dependent atelectasis in both lower lobes, right greater than left, with no discrete mass identified.  CT ABDOMEN AND PELVIS FINDINGS  Surgical clips in the gallbladder fossa. Unremarkable liver, spleen, adrenal glands. Innumerable cysts throughout both kidneys. 4.5 cm enhancing mass in the interpolar region of the left kidney. 3.3 cm enhancing mass in the upper pole of the right kidney. Renal veins are  patent. No retroperitoneal adenopathy. Atheromatous nondilated abdominal aorta. Stomach, small bowel and colon are nondilated. Expansile lytic enhancing masses involve the superior left pubic ramus and anterior acetabulum, L2 with pathologic fracture, L5 vertebral body, posterior right iliac bone, and intertrochanteric region of the right femur. Enlarged 14 mm right inguinal lymph node. Trace pelvic ascites. No free air.  IMPRESSION: 1. Bilateral enhancing renal masses suggesting either synchronous renal cell carcinoma or metastatic disease. 2. 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 3. Mediastinal adenopathy. 4. Pleural effusions, right greater than left. 5. Innumerable renal cysts suggesting polycystic kidney disease.   Electronically Signed   By: Oley Balm M.D.   On:  07/31/2013 22:25   Ct Abdomen Pelvis W Contrast  (REVIEWED by me last visit)  07/31/2013   CLINICAL DATA:  Bone lesions.  Renal failure.  EXAM: CT CHEST, ABDOMEN, AND PELVIS WITH CONTRAST  TECHNIQUE: Multidetector CT imaging of the chest, abdomen and pelvis was performed following the standard protocol during bolus administration of intravenous contrast.  CONTRAST:  80mL OMNIPAQUE IOHEXOL 300 MG/ML  SOLN  COMPARISON:  07/11/2005  FINDINGS: CT CHEST FINDINGS  Hyperemic 2.3 cm right paratracheal lymph node. Smaller enlarged precarinal, subcarinal, pretracheal, and prevascular lymph nodes. Trace left small and right pleural effusions. There is a permeative lesion in the proximal right 9th rib with surrounding soft tissue and associated pathologic fracture. Smaller lytic lesion with path fracture through lateral aspect right 6th rib. Old healed left rib fractures. Small subpleural blebs in both upper lobes. There is dependent atelectasis in both lower lobes, right greater than left, with no discrete mass identified.  CT ABDOMEN AND PELVIS FINDINGS  Surgical clips in the gallbladder fossa. Unremarkable liver, spleen, adrenal glands. Innumerable cysts throughout both kidneys. 4.5 cm enhancing mass in the interpolar region of the left kidney. 3.3 cm enhancing mass in the upper pole of the right kidney. Renal veins are patent. No retroperitoneal adenopathy. Atheromatous nondilated abdominal aorta. Stomach, small bowel and colon are nondilated. Expansile lytic enhancing masses involve the superior left pubic ramus and anterior acetabulum, L2 with pathologic fracture, L5 vertebral body, posterior right iliac bone, and intertrochanteric region of the right femur. Enlarged 14 mm right inguinal lymph node. Trace pelvic ascites. No free air.  IMPRESSION: 1. Bilateral enhancing renal masses suggesting either synchronous renal cell carcinoma or metastatic disease. 2. Lytic osseous metastatic disease involving right ribs, L2  with pathologic fracture, L5, pelvis, and right femur. 3. Mediastinal adenopathy. 4. Pleural effusions, right greater than left. 5. Innumerable renal cysts suggesting polycystic kidney disease.   Electronically Signed   By: Oley Balm M.D.   On: 07/31/2013 22:25   Mr Hip Left Wo Contrast (REVIEWED by me last visit)  07/31/2013   CLINICAL DATA:  Severe left hip pain. Abnormal radiographs dated 07/15/2013  EXAM: MRI OF THE LEFT HIP WITHOUT CONTRAST  TECHNIQUE: Multiplanar, multisequence MR imaging was performed. No intravenous contrast was administered.  COMPARISON:  Radiographs dated 07/15/2013  FINDINGS: There is a 6 x 6 x 5 cm inhomogeneous destructive soft tissue mass destroying the anterior medial and anterior superior aspects of the left acetabulum and the lateral aspect of the left superior pubic ramus. The tumor protrudes into the pelvis. Tumor extends into the left internal obturator muscle.  There is an expansile 2 cm mass in the posterior aspect of the L5 vertebral body slightly protruding into the spinal canal. There is a 2.5 cm destructive  lesion in the posterior aspect of the left iliac bone adjacent to the superior aspect of the left sacroiliac joint. There is a 16 mm destructive lesion in the posterior lateral aspect of the proximal right femur just below the right greater trochanter. There is a 4 cm destructive lesion in the medullary canal of the proximal right femoral shaft.  The patient does have fairly severe arthritis of the superior aspect of the left femoral head with joint space narrowing and degenerative changes of the remaining portion of the left acetabulum. There small bilateral hip effusions. There is extensive edema in the adjacent soft tissues around the left hip. The edema extends into the left side of the pelvis.  2.7 cm cyst in the right groin. This is of unknown etiology but appears benign.  IMPRESSION: 1. Numerous destructive bone lesions, with the largest lesion  destroying the left acetabulum, consistent with metastatic disease. 2. 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 3. Critical Value/emergent results were called by telephone at the time of interpretation on 07/31/2013 at 1:15 PM to Dr.KELLIE [facility] , who verbally acknowledged these results.   Electronically Signed   By: Geanie Cooley M.D.   On: 07/31/2013 13:25   Ir Pta Venous Right  08/03/2013   CLINICAL DATA:  62 year old with end-stage renal disease and right arm fistula. The patient has right arm swelling.  EXAM: RIGHT UPPER EXTREMITY FISTULOGRAM; CENTRAL VENOUS ANGIOPLASTY  Physician: Rachelle Hora. Lowella Dandy, MD  MEDICATIONS AND MEDICAL HISTORY: Fentanyl 50 mcg.  FLUOROSCOPY TIME:  2 min and 48 seconds  PROCEDURE: The procedure was explained to the patient. The risks and benefits of the procedure were discussed and the patient's questions were addressed. Informed consent was obtained from the patient. Right upper extremity fistula was accessed with an angiocath. Fistulogram images were obtained. Arm was prepped and draped in a sterile fashion. Maximal barrier sterile technique was utilized including caps, mask, sterile gowns, sterile gloves, sterile drape, hand hygiene and skin antiseptic. The skin was anesthetized with 1% lidocaine. Angiocath was exchanged for a 7-French vascular sheath over a Bentson wire. A 5 [place] Kumpe catheter was easily advanced into the right central veins. Additional angiography was performed. A Bentson wire was easily advanced into the superior vena cava and the inferior vena cava. A 8 mm x 40 mm Conquest balloon was positioned at the junction of the right innominate vein and SVC. The balloon was inflated without complication. A significant waist formation was noted. Follow up angiography demonstrated improved flow in this area. This focal stenosis was treated a second time with a 10 mm x 40 mm balloon. This balloon was inflated for 2 minutes. The patient complained of some chest  discomfort during this balloon inflation. Follow up angiography was performed. The wire was removed. The catheter was removed with a pursestring suture.  FINDINGS: The right upper arm fistula is patent. There was a focal critical stenosis at the junction of the right innominate vein and SVC. There were large collateral vessels in the right upper chest and neck region. The post angioplasty images demonstrated markedly improved flow through the right innominate vein. There was still some filling of the collateral vessels in the right upper chest and neck.  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 This stenosis was successfully treated with balloon angioplasty.   Electronically Signed   By: Richarda Overlie M.D.   On: 08/03/2013 12:38   Ct Biopsy  08/02/2013   CLINICAL  DATA:  62 year old with renal lesions and multiple bone lesions. Tissue diagnosis is needed.  EXAM: CT BIOPSY OF LEFT PELVIC SOFT TISSUE MASS.  Physician: Rachelle Hora. Henn, MD  MEDICATIONS: Versed 2 mg, fentanyl 75 mcg. A radiology nurse monitored the patient for moderate sedation.  ANESTHESIA/SEDATION: Moderate sedation time: 17 min  PROCEDURE: The procedure was explained to the patient. The risks and benefits of the procedure were discussed and the patient's questions were addressed. Informed consent was obtained from the patient. The patient was placed supine on the CT scanner. Images through the pelvis were obtained. The lesion in the anterior left hip was identified. The left anterior pelvis was prepped and draped in a sterile fashion. Skin was anesthetized with 1% lidocaine. Using CT guidance, a 17 gauge needle was directed into the pelvic soft tissue mass. Needle position was confirmed with CT. Three core biopsies were obtained with an 18 gauge device. Specimens were placed in formalin.  COMPLICATIONS: None  FINDINGS: There is a destructive soft tissue mass involving the anterior aspect of the left iliac  bone and the left acetabulum. Needle position was confirmed within the soft tissue lesion.  IMPRESSION: CT-guided biopsy of the left pelvic soft tissue mass.   Electronically Signed   By: Richarda Overlie M.D.   On: 08/02/2013 17:07   Dg Chest Portable 1 View  07/31/2013   CLINICAL DATA:  The shortness breath. DVT.  EXAM: PORTABLE CHEST - 1 VIEW  COMPARISON:  07/15/2013 and 12/02/2009  FINDINGS: Right base wedge-shaped consolidation. This may represent atelectasis. Pulmonary infarct or infiltrate not excluded. Recommend followup until clearance.  Elevated right hemidiaphragm. This limits evaluation of the right lung base.  Pulmonary vascular prominence most notable centrally.  Cardiomegaly.  Tortuous aorta.  The previously questioned nodule adjacent to the left hemidiaphragm is not delineated on the present exam. Recommend followup two view chest when able with attention to this region.  IMPRESSION: Right base wedge-shaped consolidation. This may represent atelectasis. Pulmonary infarct or infiltrate not excluded.  Pulmonary vascular prominence most notable centrally.  Cardiomegaly.  Tortuous aorta.  The previously questioned nodule adjacent to the left hemidiaphragm is not delineated on the present exam. Recommend followup two view chest when able with attention to this region.   Electronically Signed   By: Bridgett Larsson M.D.   On: 07/31/2013 17:52   Ir Shuntogram/ Fistulagram Right Mod Sed  08/03/2013   CLINICAL DATA:  62 year old with end-stage renal disease and right arm fistula. The patient has right arm swelling.  EXAM: RIGHT UPPER EXTREMITY FISTULOGRAM; CENTRAL VENOUS ANGIOPLASTY  Physician: Rachelle Hora. Lowella Dandy, MD  MEDICATIONS AND MEDICAL HISTORY: Fentanyl 50 mcg.  FLUOROSCOPY TIME:  2 min and 48 seconds  PROCEDURE: The procedure was explained to the patient. The risks and benefits of the procedure were discussed and the patient's questions were addressed. Informed consent was obtained from the patient. Right upper  extremity fistula was accessed with an angiocath. Fistulogram images were obtained. Arm was prepped and draped in a sterile fashion. Maximal barrier sterile technique was utilized including caps, mask, sterile gowns, sterile gloves, sterile drape, hand hygiene and skin antiseptic. The skin was anesthetized with 1% lidocaine. Angiocath was exchanged for a 7-French vascular sheath over a Bentson wire. A 5 [place] Kumpe catheter was easily advanced into the right central veins. Additional angiography was performed. A Bentson wire was easily advanced into the superior vena cava and the inferior vena cava. A 8 mm x 40 mm Conquest balloon was positioned  at the junction of the right innominate vein and SVC. The balloon was inflated without complication. A significant waist formation was noted. Follow up angiography demonstrated improved flow in this area. This focal stenosis was treated a second time with a 10 mm x 40 mm balloon. This balloon was inflated for 2 minutes. The patient complained of some chest discomfort during this balloon inflation. Follow up angiography was performed. The wire was removed. The catheter was removed with a pursestring suture.  FINDINGS: The right upper arm fistula is patent. There was a focal critical stenosis at the junction of the right innominate vein and SVC. There were large collateral vessels in the right upper chest and neck region. The post angioplasty images demonstrated markedly improved flow through the right innominate vein. There was still some filling of the collateral vessels in the right upper chest and neck.  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 This stenosis was successfully treated with balloon angioplasty.   Electronically Signed   By: Richarda Overlie M.D.   On: 08/03/2013 12:38   PATHOLOGY: Soft Tissue Needle Core Biopsy, left anterior pelvis, acetabular - METASTATIC RENAL CELL CARCINOMA, CLEAR CELL TYPE, SEE  COMMENT. Microscopic Comment The history of "renal lesions" is noted. The biopsy findings demonstrate clear cell neoplasm with morphologicfeatures consistent with metastatic renal cell carcinoma, clear cell type. The case was reviewed with Dr. Dierdre Searles who concurs. (CRR:gt, 08/05/13)  [place] RUND DO Pathologist, Electronic Signature (Case signed 08/05/2013)  ASSESSMENT: [name] 62 y.o. 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  Risk factors are longstanding hypertension, heavier smoker, complex kidney mass although not the typical finding on imaging with 30-40 % presenting with bone metastases. --He tolerates therapy well.  He continue rehabilitation at SNF.   -- Last visit, we reviewed extensively the patients pathology and imaging in detail and explained that it is consistent with metastatic renal cell carcinoma which is not curable but treatable.   We then discussed treatment options include Palliative therapy with radiation and/or chemotherapy, i.e., sutent versus best supportive care.  Given his ESRDz, he was not a candidate for clinical trial.   We then discussed starting chemotherapy to control the spread of his disease with sutent or sunitinib at 50 mg on days 1-28 of 42 day treatment cycle.   Given his H/D we would consider increasing subsequent doses gradually to 2x usual dose based on tolerability and safety.  We also explained the possible side-effects included but was not limited to MI, CHF, QT prolongation, severe Hypertensive crisis, thromboembolism, hepatotoxicity, hypothyroidism, neutropenia,  lymphopenia, thrombocytopenia, diarrhea, nausea/vomiting, skin discoloration, constipation, hand-foot syndrome, headache and back pain.  He understood these risks, and agreed to proceed with treatment.  Prior to starting we will obtain, TFTs, he is on H/D and anuric, echocardiogram given his history of hypertension and chronic renal disease.  Echocardiogram in 2006 demonstrated hypertrophy.  LFTs are within normal limits.   --Adjunctive bone therapy.  He is unlikely to be a candidate for bisphosphonate therapy given ESRDz and/or poor dentition.  We will make referral to dentistry upon next visit.  2. ESRDz on H/D. --Continue hemodialysis per nephrology with epo.  3. Hypertension. --Continue amlodipine 10 mg, losartan 25 mg daily.   4. Anemia in chronic kidney disease -- Continue epo and RBC transfusion prn symptoms. Hgb of 12.8 today up from 11.3.   5. Pathological fracture of Left hip secondary to #1.   --Completed XRT on 08/21/2013.  Continue pain control with hydrocodone-acetaminophen 5-325 mg (two tabs q 4 hours prn moderate pain) and tramadol 50 q 6 hours prn pain.   May add long-acting if this is not enough.   6. Follow-up.  --He will follow up for labs in 4 weeks.   Prescription for sutent provided to his niece who takes it to  his nursing facility.     He will follow up for a symptom check in 4 weeks with labs including chemistries and CBC.    All questions were answered. The patient knows to call the clinic with any problems, questions or concerns. We can certainly see the patient much sooner if necessary.  I spent 25 minutes counseling the patient face to face. The total time spent in the appointment was 40 minutes.    Neill Jurewicz, MD 09/17/2013 6:57 AM

## 2013-09-23 ENCOUNTER — Telehealth: Payer: Self-pay | Admitting: Internal Medicine

## 2013-09-23 NOTE — Telephone Encounter (Signed)
returned call to [facility] @ [facility] [place] [phone]) re r/s 1/15 appt/ not able to reach heather but s/w leslie niblet and gv her new appt for 1/16 @ 12:30p lb/fu. leslie given # to radonc to call to change his radonc appt. pt has dialysyis on thursday.

## 2013-10-02 ENCOUNTER — Other Ambulatory Visit: Payer: Self-pay | Admitting: Medical Oncology

## 2013-10-02 ENCOUNTER — Telehealth: Payer: Self-pay | Admitting: Medical Oncology

## 2013-10-02 ENCOUNTER — Encounter: Payer: Self-pay | Admitting: Internal Medicine

## 2013-10-02 DIAGNOSIS — C649 Malignant neoplasm of unspecified kidney, except renal pelvis: Secondary | ICD-10-CM

## 2013-10-02 MED ORDER — SUNITINIB MALATE 50 MG PO CAPS: ORAL_CAPSULE | ORAL | Status: DC

## 2013-10-02 NOTE — Progress Notes (Signed)
Faxed sutent prescription to [facility]; [facility] pharmacy can no longer get sutent.

## 2013-10-02 NOTE — Telephone Encounter (Signed)
Jane Hester-Physical Therapist with [facility] called asking if pt could put a little weight on his left leg. He is hoping to go home and it is hard to use the walker without a little weight on the left leg. Per Chism he can not give that advise. He is aware that pt was seen by an orthopedic surgeon and he was not a candidate for surgery. I looked into his hospital admission notes and found a note from Dr. Ranell Patrick. He stated in his note STRICT non-weight bearing due to bone loss and fear of break. I read this to Erskine Squibb so pt can not weight bear. She thanked me for looking into this matter.

## 2013-10-07 ENCOUNTER — Telehealth: Payer: Self-pay | Admitting: Medical Oncology

## 2013-10-07 ENCOUNTER — Other Ambulatory Visit: Payer: Self-pay | Admitting: Medical Oncology

## 2013-10-07 NOTE — Telephone Encounter (Signed)
Pt's sister Kandice Moos called asking if we have heard from his sutent prescription. He was getting it filled by [facility] outpt pharmacy but they can longer fill because they are not a specialty pharmacy. Prescription was faxed to [place] 10/02/14. I called and spoke with the pharmacist and they needed to clarify a diagnosis. This information was given and she states it should be processed. I called Shurrane the sister with this information. Pt was suppose to restart the drug 10/04/13. I asked Shurrane to call me if she has not heard from [facility] by am of 10/08/13. She voiced understanding.

## 2013-10-09 NOTE — Telephone Encounter (Signed)
Faxed confirmation from [facility] that Sutent was dispensed on 10/07/13.

## 2013-10-10 ENCOUNTER — Ambulatory Visit: Payer: Medicare Other | Admitting: Radiation Oncology

## 2013-10-14 ENCOUNTER — Encounter: Payer: Self-pay | Admitting: Radiation Oncology

## 2013-10-15 ENCOUNTER — Telehealth: Payer: Self-pay

## 2013-10-15 ENCOUNTER — Encounter: Payer: Self-pay | Admitting: Internal Medicine

## 2013-10-15 ENCOUNTER — Non-Acute Institutional Stay (SKILLED_NURSING_FACILITY): Payer: Medicare Other | Admitting: Internal Medicine

## 2013-10-15 DIAGNOSIS — I10 Essential (primary) hypertension: Secondary | ICD-10-CM

## 2013-10-15 DIAGNOSIS — C649 Malignant neoplasm of unspecified kidney, except renal pelvis: Secondary | ICD-10-CM

## 2013-10-15 DIAGNOSIS — C7952 Secondary malignant neoplasm of bone marrow: Secondary | ICD-10-CM

## 2013-10-15 DIAGNOSIS — Z992 Dependence on renal dialysis: Secondary | ICD-10-CM

## 2013-10-15 DIAGNOSIS — Z720 Tobacco use: Secondary | ICD-10-CM

## 2013-10-15 DIAGNOSIS — C7951 Secondary malignant neoplasm of bone: Secondary | ICD-10-CM

## 2013-10-15 DIAGNOSIS — N2581 Secondary hyperparathyroidism of renal origin: Secondary | ICD-10-CM

## 2013-10-15 DIAGNOSIS — B1920 Unspecified viral hepatitis C without hepatic coma: Secondary | ICD-10-CM

## 2013-10-15 DIAGNOSIS — N039 Chronic nephritic syndrome with unspecified morphologic changes: Secondary | ICD-10-CM

## 2013-10-15 DIAGNOSIS — N186 End stage renal disease: Secondary | ICD-10-CM

## 2013-10-15 DIAGNOSIS — F17200 Nicotine dependence, unspecified, uncomplicated: Secondary | ICD-10-CM

## 2013-10-15 DIAGNOSIS — M8440XA Pathological fracture, unspecified site, initial encounter for fracture: Secondary | ICD-10-CM

## 2013-10-15 DIAGNOSIS — D631 Anemia in chronic kidney disease: Secondary | ICD-10-CM

## 2013-10-15 DIAGNOSIS — N189 Chronic kidney disease, unspecified: Secondary | ICD-10-CM

## 2013-10-15 NOTE — Telephone Encounter (Signed)
S/w sister. Pt has received the sutent. She took it to the facility he is at. He will be coming home in a few days. i reinforced that she needs to take the sutent home from the facility. She confirmed this. She confirmed his follow up appts at [facility]

## 2013-10-15 NOTE — Progress Notes (Signed)
MRN: [id number] Name: [name]  Sex: male Age: 63 y.o. DOB: Oct 15, 1950  [place] #: [facility] Facility/Room: 106 Level Of Care: SNF Provider: Inocencio Homes D Emergency Contacts: Extended Emergency Contact Information Primary Emergency Contact: Webb,Shurrane Address: [place], [place] [address] [place] Phone: [phone] Relation: Sister  Code Status: FULL  Allergies: Review of patient's allergies indicates no known allergies.  Chief Complaint  Patient presents with  . Discharge Note    HPI: Patient is 63 y.o. male who has ESRD, on dialysis, and renal CA metastatic to bone who was admitted for rehab after a pathologic pubic ramus fracture, who is now being discharged to home to live with sister.  Past Medical History  Diagnosis Date  . Renal disorder   . Hypertension   . Dialysis patient   . Secondary hyperparathyroidism (of renal origin)     s/p parathyroidectomy  . Anemia due to chronic disease treated with erythropoietin   . 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 Closed fracture of pubic ramus 08/2013    pathologic (metastatic renal cell ca)  . History of radiation therapy 08/08/13-08/21/13    L-1-L5, ,lt hip/pelvis,prox rt femur    Past Surgical History  Procedure Laterality Date  . Av fistula placement        Medication List       This list is accurate as of: 10/15/13  1:54 PM.  Always use your most recent med list.               albuterol (5 MG/ML) 0.5% nebulizer solution  Commonly known as:  PROVENTIL  Take 0.5 mLs (2.5 mg total) by nebulization every 6 (six) hours as needed for wheezing or shortness of breath.     amLODipine 10 MG tablet  Commonly known as:  NORVASC  Take 10 mg by mouth at bedtime.     calcium carbonate 500 MG chewable tablet  Commonly known as:  TUMS - dosed in mg elemental calcium  Chew 3 tablets by mouth daily.     doxepin 25 MG capsule  Commonly known as:   SINEQUAN  Take 25 mg by mouth at bedtime.     DSS 100 MG Caps  Take 100 mg by mouth at bedtime.     HYDROcodone-acetaminophen 5-325 MG per tablet  Commonly known as:  NORCO/VICODIN  Take 2 tablets by mouth every 4 (four) hours as needed for moderate pain.     losartan 25 MG tablet  Commonly known as:  COZAAR  Take 25 mg by mouth at bedtime.     methocarbamol 500 MG tablet  Commonly known as:  ROBAXIN  Take 500 mg by mouth daily with breakfast.     multivitamin Tabs tablet  Take 1 tablet by mouth at bedtime.     PHENERGAN 25 MG/ML injection  Generic drug:  promethazine  Inject 25 mg into the muscle every 6 (six) hours as needed for nausea or vomiting.     ranitidine 150 MG capsule  Commonly known as:  ZANTAC  Take 150 mg by mouth 2 (two) times daily.     SUNItinib 50 MG capsule  Commonly known as:  SUTENT  Take one tablet by mouth daily on days 1 - 28; then two weeks off for each cycle.     traMADol 50 MG tablet  Commonly known as:  ULTRAM  Take 50 mg by mouth every 6 (six) hours as  needed for pain (pain).        Meds ordered this encounter  Medications  . ranitidine (ZANTAC) 150 MG capsule    Sig: Take 150 mg by mouth 2 (two) times daily.  . methocarbamol (ROBAXIN) 500 MG tablet    Sig: Take 500 mg by mouth daily with breakfast.  . promethazine (PHENERGAN) 25 MG/ML injection    Sig: Inject 25 mg into the muscle every 6 (six) hours as needed for nausea or vomiting.    Immunization History  Administered Date(s) Administered  . Pneumococcal Polysaccharide-23 08/02/2013    History  Substance Use Topics  . Smoking status: Former Smoker -- 1.00 packs/day for 15 years    Types: Cigarettes  . Smokeless tobacco: Current User  . Alcohol Use: No    Filed Vitals:   10/15/13 1314  BP: 145/86  Pulse: 86  Temp: 98.6 F (37 C)  Resp: 20    Physical Exam  GENERAL APPEARANCE: Alert, conversant. Appropriately groomed.   HEENT: Unremarkable. RESPIRATORY:  Breathing is even, unlabored. Lung sounds are clear   CARDIOVASCULAR: Heart RRR no murmurs, rubs or gallops. No peripheral edema.  GASTROINTESTINAL: Abdomen is soft, non-tender, not distended w/ normal bowel sounds.  NEUROLOGIC: Cranial nerves 2-12 grossly intact. Moves all extremities no tremor.  Patient Active Problem List   Diagnosis Date Noted  . Dialysis patient   . 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 Hepatitis C 08/01/2013  . Secondary hyperparathyroidism (of renal origin) 08/01/2013  . Pathological fracture- of left pubic rami 07/31/2013  . Left hip pain 07/31/2013  . Metastatic carcinoma 07/31/2013  . ESRD on hemodialysis 07/31/2013  . Anemia in chronic kidney disease 07/31/2013  . Hypertension 07/31/2013  . Tobacco abuse 07/31/2013    CBC    Component Value Date/Time   WBC 7.5 09/16/2013 0931   WBC 10.0 08/06/2013 1059   RBC 4.88 09/16/2013 0931   RBC 3.80* 08/06/2013 1059   HGB 12.8* 09/16/2013 0931   HGB 9.8* 08/06/2013 1059   HCT 37.8* 09/16/2013 0931   HCT 28.2* 08/06/2013 1059   PLT 112* 09/16/2013 0931   PLT 339 08/06/2013 1059   MCV 77.5* 09/16/2013 0931   MCV 74.2* 08/06/2013 1059   LYMPHSABS 0.8* 09/16/2013 0931   LYMPHSABS 2.1 07/31/2013 1725   MONOABS 0.3 09/16/2013 0931   MONOABS 1.3* 07/31/2013 1725   EOSABS 0.3 09/16/2013 0931   EOSABS 0.1 07/31/2013 1725   BASOSABS 0.0 09/16/2013 0931   BASOSABS 0.0 07/31/2013 1725    CMP     Component Value Date/Time   NA 139 09/16/2013 0931   NA 136 08/06/2013 1045   NA 136 08/06/2013 1045   K 5.4* 09/16/2013 0931   K 4.7 08/06/2013 1045   K 4.8 08/06/2013 1045   CL 93* 08/06/2013 1045   CL 93* 08/06/2013 1045   CO2 27 09/16/2013 0931   CO2 25 08/06/2013 1045   CO2 25 08/06/2013 1045   GLUCOSE 116 09/16/2013 0931   GLUCOSE 127* 08/06/2013 1045   GLUCOSE 121* 08/06/2013 1045   BUN 49.0* 09/16/2013 0931   BUN 56* 08/06/2013 1045   BUN 57* 08/06/2013 1045   CREATININE 7.6* 09/16/2013 0931    CREATININE 8.52* 08/06/2013 1045   CREATININE 8.61* 08/06/2013 1045   CALCIUM 8.4 09/16/2013 0931   CALCIUM 9.2 08/06/2013 1045   CALCIUM 9.2 08/06/2013 1045   PROT 7.0 09/16/2013 0931   PROT 7.5 08/03/2013 1442   ALBUMIN 2.5* 09/16/2013 0931  ALBUMIN 3.1* 08/06/2013 1045   AST 33 09/16/2013 0931   AST 14 08/03/2013 1442   ALT 44 09/16/2013 0931   ALT 10 08/03/2013 1442   ALKPHOS 100 09/16/2013 0931   ALKPHOS 86 08/03/2013 1442   BILITOT 0.44 09/16/2013 0931   BILITOT 0.3 08/03/2013 1442   GFRNONAA 6* 08/06/2013 1045   GFRNONAA 6* 08/06/2013 1045   GFRAA 7* 08/06/2013 1045   GFRAA 7* 08/06/2013 1045    Assessment and Plan  Patient is in stable condition and will be discharged with HH, OT, PT, and nursing to help him first few days of each chemo round and for any blood draws that oncologist requires.  Hennie Duos, MD

## 2013-10-16 ENCOUNTER — Telehealth: Payer: Self-pay | Admitting: *Deleted

## 2013-10-16 ENCOUNTER — Ambulatory Visit: Admission: RE | Admit: 2013-10-16 | Payer: Medicare Other | Source: Ambulatory Visit | Admitting: Radiation Oncology

## 2013-10-16 HISTORY — DX: Personal history of irradiation: Z92.3

## 2013-10-16 NOTE — Telephone Encounter (Signed)
error 

## 2013-10-17 ENCOUNTER — Other Ambulatory Visit: Payer: Medicare Other

## 2013-10-17 ENCOUNTER — Ambulatory Visit: Payer: Medicare Other

## 2013-10-17 ENCOUNTER — Ambulatory Visit: Payer: Medicare Other | Admitting: Radiation Oncology

## 2013-10-18 ENCOUNTER — Ambulatory Visit: Admission: RE | Admit: 2013-10-18 | Payer: Medicare Other | Source: Ambulatory Visit | Admitting: Radiation Oncology

## 2013-10-18 ENCOUNTER — Other Ambulatory Visit (HOSPITAL_BASED_OUTPATIENT_CLINIC_OR_DEPARTMENT_OTHER): Payer: Medicare Other

## 2013-10-18 ENCOUNTER — Telehealth: Payer: Self-pay | Admitting: Internal Medicine

## 2013-10-18 ENCOUNTER — Ambulatory Visit (HOSPITAL_BASED_OUTPATIENT_CLINIC_OR_DEPARTMENT_OTHER): Payer: Medicare Other | Admitting: Internal Medicine

## 2013-10-18 VITALS — BP 138/86 | HR 75 | Temp 98.0°F | Resp 18

## 2013-10-18 DIAGNOSIS — K759 Inflammatory liver disease, unspecified: Secondary | ICD-10-CM

## 2013-10-18 DIAGNOSIS — M62830 Muscle spasm of back: Secondary | ICD-10-CM

## 2013-10-18 DIAGNOSIS — R748 Abnormal levels of other serum enzymes: Secondary | ICD-10-CM

## 2013-10-18 DIAGNOSIS — R112 Nausea with vomiting, unspecified: Secondary | ICD-10-CM

## 2013-10-18 DIAGNOSIS — I129 Hypertensive chronic kidney disease with stage 1 through stage 4 chronic kidney disease, or unspecified chronic kidney disease: Secondary | ICD-10-CM

## 2013-10-18 DIAGNOSIS — C7951 Secondary malignant neoplasm of bone: Principal | ICD-10-CM

## 2013-10-18 DIAGNOSIS — N039 Chronic nephritic syndrome with unspecified morphologic changes: Secondary | ICD-10-CM

## 2013-10-18 DIAGNOSIS — N189 Chronic kidney disease, unspecified: Secondary | ICD-10-CM

## 2013-10-18 DIAGNOSIS — C649 Malignant neoplasm of unspecified kidney, except renal pelvis: Secondary | ICD-10-CM

## 2013-10-18 DIAGNOSIS — D649 Anemia, unspecified: Secondary | ICD-10-CM

## 2013-10-18 DIAGNOSIS — D631 Anemia in chronic kidney disease: Secondary | ICD-10-CM

## 2013-10-18 DIAGNOSIS — C7952 Secondary malignant neoplasm of bone marrow: Secondary | ICD-10-CM

## 2013-10-18 LAB — COMPREHENSIVE METABOLIC PANEL (CC13)
ALBUMIN: 2.7 g/dL — AB (ref 3.5–5.0)
ALT: 938 U/L — ABNORMAL HIGH (ref 0–55)
AST: 700 U/L — ABNORMAL HIGH (ref 5–34)
Alkaline Phosphatase: 149 U/L (ref 40–150)
Anion Gap: 15 mEq/L — ABNORMAL HIGH (ref 3–11)
BUN: 50.6 mg/dL — AB (ref 7.0–26.0)
CO2: 27 mEq/L (ref 22–29)
Calcium: 9.8 mg/dL (ref 8.4–10.4)
Chloride: 98 mEq/L (ref 98–109)
Creatinine: 5.6 mg/dL (ref 0.7–1.3)
GLUCOSE: 91 mg/dL (ref 70–140)
Potassium: 4.5 mEq/L (ref 3.5–5.1)
Sodium: 141 mEq/L (ref 136–145)
Total Bilirubin: 0.75 mg/dL (ref 0.20–1.20)
Total Protein: 7.9 g/dL (ref 6.4–8.3)

## 2013-10-18 LAB — LACTATE DEHYDROGENASE (CC13): LDH: 569 U/L — AB (ref 125–245)

## 2013-10-18 LAB — CBC WITH DIFFERENTIAL/PLATELET
BASO%: 1.3 % (ref 0.0–2.0)
Basophils Absolute: 0.1 10*3/uL (ref 0.0–0.1)
EOS ABS: 0.1 10*3/uL (ref 0.0–0.5)
EOS%: 1.7 % (ref 0.0–7.0)
HEMATOCRIT: 31.1 % — AB (ref 38.4–49.9)
HEMOGLOBIN: 10.7 g/dL — AB (ref 13.0–17.1)
LYMPH%: 9.3 % — ABNORMAL LOW (ref 14.0–49.0)
MCH: 26.8 pg — ABNORMAL LOW (ref 27.2–33.4)
MCHC: 34.4 g/dL (ref 32.0–36.0)
MCV: 77.8 fL — ABNORMAL LOW (ref 79.3–98.0)
MONO#: 0.9 10*3/uL (ref 0.1–0.9)
MONO%: 12.5 % (ref 0.0–14.0)
NEUT%: 75.2 % — AB (ref 39.0–75.0)
NEUTROS ABS: 5.4 10*3/uL (ref 1.5–6.5)
Platelets: 149 10*3/uL (ref 140–400)
RBC: 4 10*6/uL — ABNORMAL LOW (ref 4.20–5.82)
RDW: 23.8 % — ABNORMAL HIGH (ref 11.0–14.6)
WBC: 7.1 10*3/uL (ref 4.0–10.3)
lymph#: 0.7 10*3/uL — ABNORMAL LOW (ref 0.9–3.3)
nRBC: 17 % — ABNORMAL HIGH (ref 0–0)

## 2013-10-18 MED ORDER — PROCHLORPERAZINE MALEATE 10 MG PO TABS: 10.0000 mg | ORAL_TABLET | Freq: Three times a day (TID) | ORAL | Status: DC | PRN

## 2013-10-18 MED ORDER — FERROUS SULFATE 325 (65 FE) MG PO TBEC: 325.0000 mg | DELAYED_RELEASE_TABLET | Freq: Two times a day (BID) | ORAL | Status: DC

## 2013-10-18 MED ORDER — CYCLOBENZAPRINE HCL 10 MG PO TABS: 10.0000 mg | ORAL_TABLET | Freq: Three times a day (TID) | ORAL | Status: DC | PRN

## 2013-10-18 NOTE — Patient Instructions (Signed)
Cyclobenzaprine Extended Release Capsule What is this medicine? CYCLOBENZAPRINE (sye kloe BEN za preen) is a muscle relaxer. It is used to treat muscle pain, spasms, and stiffness. This medicine may be used for other purposes; ask your health care provider or pharmacist if you have questions. COMMON BRAND NAME(S): Amrix What should I tell my health care provider before I take this medicine? They need to know if you have any of these conditions: -heart disease -irregular heartbeat -liver disease -past heart attack -thyroid problem -an unusual or allergic reaction to cyclobenzaprine, tricyclic antidepressants, lactose, other medicines, foods, dyes, or preservatives -pregnant or trying to get pregnant -breast-feeding How should I use this medicine? Take this medicine by mouth with a glass of water. Follow the directions on the prescription label. Do not cut, crush or chew this medicine. If this medicine upsets your stomach, take it with food or milk. Take your medicine at regular intervals. Do not take it more often than directed. Talk to your pediatrician regarding the use of this medicine in children. Special care may be needed. Overdosage: If you think you have taken too much of this medicine contact a poison control center or emergency room at once. NOTE: This medicine is only for you. Do not share this medicine with others. What if I miss a dose? If you miss a dose, take it as soon as you can. If it is almost time for your next dose, take only that dose. Do not take double or extra doses. What may interact with this medicine? Do not take this medicine with any of the following medications: -certain medicines for fungal infections like fluconazole, itraconazole, ketoconazole, posaconazole, voriconazole -cisapride -dofetilide -dronedarone -droperidol -flecainide -grepafloxacin -halofantrine -levomethadyl -MAOIs like Carbex, Eldepryl, Marplan, Nardil, and  Parnate -nilotinib -pimozide -probucol -sertindole -thioridazine -ziprasidone  This medicine may also interact with the following medications: -abarelix -alcohol -certain medicines for cancer -certain medicines for depression, anxiety, or psychotic disturbances -certain medicines for infection like alfuzosin, chloroquine, clarithromycin, levofloxacin, mefloquine, pentamidine, troleandomycin -certain medicines for irregular heart beat -certain medicines used for sleep or numbness during surgery or procedure -contrast dyes -dolasetron -guanethidine -methadone -octreotide -ondansetron -other medicines that prolong the QT interval (cause an abnormal heart rhythm) -palonosetron -phenothiazines like chlorpromazine, mesoridazine, prochlorperazine, thioridazine -tramadol -vardenafil This list may not describe all possible interactions. Give your health care provider a list of all the medicines, herbs, non-prescription drugs, or dietary supplements you use. Also tell them if you smoke, drink alcohol, or use illegal drugs. Some items may interact with your medicine. What should I watch for while using this medicine? Tell your doctor or healthcare professional if your symptoms do not start to get better or if they get worse. You may get drowsy or dizzy. Do not drive, use machinery, or do anything that needs mental alertness until you know how this medicine affects you. Do not stand or sit up quickly, especially if you are an older patient. This reduces the risk of dizzy or fainting spells. Alcohol may interfere with the effect of this medicine. Avoid alcoholic drinks. Your mouth may get dry. Drinking water, chewing sugarless gum, or sucking on hard candy may help. What side effects may I notice from receiving this medicine? Side effects that you should report to your doctor or health care professional as soon as possible: -allergic reactions like skin rash, itching or hives, swelling of the  face, lips, or tongue -chest pain -fast heartbeat -hallucinations -seizures -vomiting Side effects that usually do not require medical  attention (report to your doctor or health care professional if they continue or are bothersome): -headache This list may not describe all possible side effects. Call your doctor for medical advice about side effects. You may report side effects to FDA at 1-800-FDA-1088. Where should I keep my medicine? Keep out of the reach of children. Store at room temperature between 15 and 30 degrees C (59 and 86 degrees F). Keep container tightly closed. Throw away any unused medicine after the expiration date. NOTE: This sheet is a summary. It may not cover all possible information. If you have questions about this medicine, talk to your doctor, pharmacist, or health care provider.  2014, [employer]/Gold Standard. (2013-04-16 12:51:28) Iron tablets, capsules, extended-release tablets What is this medicine? IRON (AHY ern) replaces iron that is essential to healthy red blood cells. Iron is used to treat iron deficiency anemia. Anemia may cause problems like tiredness, shortness of breath, or slowed growth in children. Only take iron if your doctor has told you to. Do not treat yourself with iron if you are feeling tired. Most healthy people get enough iron in their diets, particularly if they eat cereals, meat, poultry, and fish. This medicine may be used for other purposes; ask your health care provider or pharmacist if you have questions. COMMON BRAND NAME(S): Bifera, [place], Feosol Complete, [employer], [place], [place], [place] , [place], Fero-Grad 500, Ferretts, Ferrimin , Ferro-Sequels, Hemocyte, Nephro-Fer, NovaFerrum, ProFe, Slow Fe, Slow Iron , Tandem What should I tell my health care provider before I take this medicine? They need to know if you have any of these conditions: -frequently drink alcohol -bowel disease -hemolytic anemia -iron overload  (hemochromatosis, hemosiderosis) -liver disease -problems with swallowing -stomach ulcer or other stomach problems -an unusual or allergic reaction to iron, other medicines, foods, dyes, or preservatives -pregnant or trying to get pregnant -breast-feeding How should I use this medicine? Take this medicine by mouth with a glass of water or fruit juice. Follow the directions on the prescription label. Swallow whole. Do not crush or chew. Take this medicine in an upright or sitting position. Try to take any bedtime doses at least 10 minutes before lying down. You may take this medicine with food. Take your medicine at regular intervals. Do not take your medicine more often than directed. Do not stop taking except on your doctor's advice. Talk to your pediatrician regarding the use of this medicine in children. While this drug may be prescribed for selected conditions, precautions do apply. Overdosage: If you think you have taken too much of this medicine contact a poison control center or emergency room at once. NOTE: This medicine is only for you. Do not share this medicine with others. What if I miss a dose? If you miss a dose, take it as soon as you can. If it is almost time for your next dose, take only that dose. Do not take double or extra doses. What may interact with this medicine? If you are taking this iron product, you should not take iron in any other medicine or dietary supplement. This medicine may also interact with the following medications: -alendronate -antacids -cefdinir -chloramphenicol -cholestyramine -deferoxamine -dimercaprol -etidronate -medicines for stomach ulcers or other stomach problems -pancreatic enzymes -quinolone antibiotics (examples: Cipro, Floxin, Levaquin, Tequin and others) -risedronate -tetracycline antibiotics (examples: doxycycline, tetracycline, minocycline, and others) -thyroid hormones This list may not describe all possible interactions. Give  your health care provider a list of all the medicines, herbs, non-prescription drugs, or dietary  supplements you use. Also tell them if you smoke, drink alcohol, or use illegal drugs. Some items may interact with your medicine. What should I watch for while using this medicine? Use iron supplements only as directed by your health care professional. Dennis Bast will need important blood work while you are taking this medicine. It may take 3 to 6 months of therapy to treat low iron levels. Pregnant women should follow the dose and length of iron treatment as directed by their doctors. Do not use iron longer than prescribed, and do not take a higher dose than recommended. Long-term use may cause excess iron to build-up in the body. Do not take iron with antacids. If you need to take an antacid, take it 2 hours after a dose of iron. What side effects may I notice from receiving this medicine? Side effects that you should report to your doctor or health care professional as soon as possible: -allergic reactions like skin rash, itching or hives, swelling of the face, lips, or tongue -blue lips, nails, or palms -dark colored stools (this may be due to the iron, but can indicate a more serious condition) -drowsiness -pain with or difficulty swallowing -pale or clammy skin -seizures -stomach pain -unusually weak or tired -vomiting -weak, fast, or irregular heartbeat Side effects that usually do not require medical attention (report to your doctor or health care professional if they continue or are bothersome): -constipation -indigestion -nausea or stomach upset This list may not describe all possible side effects. Call your doctor for medical advice about side effects. You may report side effects to FDA at 1-800-FDA-1088. Where should I keep my medicine? Keep out of the reach of children. Even small amounts of iron can be harmful to a child. Store at room temperature between 15 and 30 degrees C (59 and 86  degrees F). Keep container tightly closed. Throw away any unused medicine after the expiration date. NOTE: This sheet is a summary. It may not cover all possible information. If you have questions about this medicine, talk to your doctor, pharmacist, or health care provider.  2014, Elsevier/Gold Standard. (2008-02-05 17:03:41) Prochlorperazine tablets What is this medicine? PROCHLORPERAZINE (proe klor PER a zeen) helps to control severe nausea and vomiting. This medicine is also used to treat schizophrenia. It can also help patients who experience anxiety that is not due to psychological illness. This medicine may be used for other purposes; ask your health care provider or pharmacist if you have questions. COMMON BRAND NAME(S): Compazine What should I tell my health care provider before I take this medicine? They need to know if you have any of these conditions: -blood disorders or disease -dementia -liver disease or jaundice -Parkinson's disease -uncontrollable movement disorder -an unusual or allergic reaction to prochlorperazine, other medicines, foods, dyes, or preservatives -pregnant or trying to get pregnant -breast-feeding How should I use this medicine? Take this medicine by mouth with a glass of water. Follow the directions on the prescription label. Take your doses at regular intervals. Do not take your medicine more often than directed. Do not stop taking this medicine suddenly. This can cause nausea, vomiting, and dizziness. Ask your doctor or health care professional for advice. Talk to your pediatrician regarding the use of this medicine in children. Special care may be needed. While this drug may be prescribed for children as young as 2 years for selected conditions, precautions do apply. Overdosage: If you think you have taken too much of this medicine contact a poison control  center or emergency room at once. NOTE: This medicine is only for you. Do not share this medicine  with others. What if I miss a dose? If you miss a dose, take it as soon as you can. If it is almost time for your next dose, take only that dose. Do not take double or extra doses. What may interact with this medicine? Do not take this medicine with any of the following medications: -amoxapine -antidepressants like citalopram, escitalopram, fluoxetine, paroxetine, and sertraline -deferoxamine -dofetilide -maprotiline -tricyclic antidepressants like amitriptyline, clomipramine, imipramine, nortiptyline and others This medicine may also interact with the following medications: -lithium -medicines for pain -phenytoin -propranolol -warfarin This list may not describe all possible interactions. Give your health care provider a list of all the medicines, herbs, non-prescription drugs, or dietary supplements you use. Also tell them if you smoke, drink alcohol, or use illegal drugs. Some items may interact with your medicine. What should I watch for while using this medicine? Visit your doctor or health care professional for regular checks on your progress. You may get drowsy or dizzy. Do not drive, use machinery, or do anything that needs mental alertness until you know how this medicine affects you. Do not stand or sit up quickly, especially if you are an older patient. This reduces the risk of dizzy or fainting spells. Alcohol may interfere with the effect of this medicine. Avoid alcoholic drinks. This medicine can reduce the response of your body to heat or cold. Dress warm in cold weather and stay hydrated in hot weather. If possible, avoid extreme temperatures like saunas, hot tubs, very hot or cold showers, or activities that can cause dehydration such as vigorous exercise. This medicine can make you more sensitive to the sun. Keep out of the sun. If you cannot avoid being in the sun, wear protective clothing and use sunscreen. Do not use sun lamps or tanning beds/booths. Your mouth may get  dry. Chewing sugarless gum or sucking hard candy, and drinking plenty of water may help. Contact your doctor if the problem does not go away or is severe. What side effects may I notice from receiving this medicine? Side effects that you should report to your doctor or health care professional as soon as possible: -blurred vision -breast enlargement in men or women -breast milk in women who are not breast-feeding -chest pain, fast or irregular heartbeat -confusion, restlessness -dark yellow or brown urine -difficulty breathing or swallowing -dizziness or fainting spells -drooling, shaking, movement difficulty (shuffling walk) or rigidity -fever, chills, sore throat -involuntary or uncontrollable movements of the eyes, mouth, head, arms, and legs -seizures -stomach area pain -unusually weak or tired -unusual bleeding or bruising -yellowing of skin or eyes Side effects that usually do not require medical attention (report to your doctor or health care professional if they continue or are bothersome): -difficulty passing urine -difficulty sleeping -headache -sexual dysfunction -skin rash, or itching This list may not describe all possible side effects. Call your doctor for medical advice about side effects. You may report side effects to FDA at 1-800-FDA-1088. Where should I keep my medicine? Keep out of the reach of children. Store at room temperature between 15 and 30 degrees C (59 and 86 degrees F). Protect from light. Throw away any unused medicine after the expiration date. NOTE: This sheet is a summary. It may not cover all possible information. If you have questions about this medicine, talk to your doctor, pharmacist, or health care provider.  2014, [employer]vier/Gold Standard. (  2012-02-07 16:59:39)  

## 2013-10-18 NOTE — Telephone Encounter (Signed)
gave pt appt for lab,md for February , gave pt oral contrast for CT before md visit

## 2013-10-20 NOTE — Progress Notes (Signed)
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 - Plan: prochlorperazine (COMPAZINE) 10 MG tablet, cyclobenzaprine (FLEXERIL) 10 MG tablet, CBC with Differential, Comprehensive metabolic panel (Cmet) - [facility], Lactate dehydrogenase (LDH) - [facility], CT Chest W Contrast, CT Abdomen Pelvis W Contrast, Ambulatory referral to Nutrition and Diabetic Education, Comprehensive metabolic panel (Cmet) - [facility], Comprehensive metabolic panel, Comprehensive metabolic panel, Comprehensive metabolic panel  Anemia, unspecified - Plan: ferrous sulfate 325 (65 FE) MG EC tablet  Nausea with vomiting - Plan: prochlorperazine (COMPAZINE) 10 MG tablet  Muscle spasm of back - Plan: cyclobenzaprine (FLEXERIL) 10 MG tablet  Hepatitis  Chief Complaint  Patient presents with  . 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  Started sutent on 08/24/2013.  On cycle #2, scheduled for last pill on 02/04.  STOPPING SUTENT today due to liver transiminasitis.   INTERVAL HISTORY: [name] 63 y.o. male African American male smoker with multiple medical problems including ESRD on HD, asked to see for evaluation metastatic bone lesions of unknown primary which were determined to be secondary to clear cell renal cell carcinoma. He is here for follow-up.  He was last seen by me on 09/16/2013.  He was initially seen by me as a hospital consultation on 07/31/2013. He was admitted on 07/31/2013 after he had presented with worsening left hip and right rib pain Requiring outpatient workup. Of note, he had been seen by PCP/Orthopedics 2-3 weeks prior, at which time a chest x-ray and a hip x-ray demonstrated acute or subacute right rib fracture, and a suspected lucent/lytic expansile lesion of the lateral  portion of the left superior pubic ramus with underlying pathologic fractures potentially extending into the quadrilateral plate of the acetabulum. Sclerotic lesion in the left inferior pubic ramus suspicious for metastatic lesion, potentially with internal stress fracture. A questionable nodule adjacent to the left hemidiaphragm seen on the prior x-ray was not be stable on admission. MRI of the left hip without contrast on 07/31/2013 revealed a 6 x 6 x 5 cm in homogeneous destructive soft tissue mass destroying the anterior medial and anterior superior aspects of the left acetabulum and the lateral aspect of the left superior pubic ramus. The tumor protrudes into the pelvis, extending into the left internal obturator muscle. There is an expansile 2 cm mass in the posterior aspect of the L5 vertebral body slightly protruding into the spinal canal. A 2.5 cm destructive lesion in the posterior aspect of the left iliac bone adjacent to the superior aspect of the left sacroiliac joint, a 16 mm destructive lesion in the posterior lateral aspect of the proximal right femur just below the right greater trochanter and a 4 cm destructive lesion in the medullary canal of the proximal right femoral shaft are noted. Small bilateral hip effusions and extensive edema in the adjacent soft tissues around the left hip extending into the left side of the pelvis are seen.   CT of the chest abdomen and pelvis with contrast on 07/31/2013 demonstrated a hyperemic 2.3 cm right paratracheal lymph node, smaller enlarged precarinal, subcarinal, pretracheal, and prevascular lymph nodes, trace left small and right pleural effusions, a permeative lesion in the proximal right 9th rib with surrounding soft tissue and associated pathologic fracture. A Smaller lytic lesion with path fracture  through lateral aspect right 6th rib along with Old healed left rib fractures are seen. Small subpleural blebs in both upper lobes are noted. Liver, spleen,  adrenal glands are unremarkable. Innumerable cysts throughout both kidneys are seen. A 4.5 cm enhancing mass in the interpolar region of the left kidney and a 3.3 cm enhancing mass in the upper pole of the right kidney are noted. Renal veins are patent. No retroperitoneal adenopathy. Stomach, small bowel and colon are nondilated. Expansile lytic enhancing masses as above. Enlarged 14 mm right inguinal lymph node was observed. Trace pelvic ascites. No free air. PSA was 1.32, normal.   Ct of bone was obtained on 10/30 and pathology determined to be consistent with metastatic renal cell carcinoma. He was started on palliative XRT to the left hip/lumbar spine  by radiation oncology (Dr. Lisbeth Renshaw) and scheduled to complete a two-week course of therapy on 08/21/2013.   He has been given darbepoetin 100mg  on 08/01/2013 and 08/08/2013 along with ferric gluconate IV 62.5 mg on 10/30 and 11/06.    Today, he comes alone from his home.  He was discharged from the nursing home  At [facility].  He reports that his pain is fairly controlled on his present regiment.  He does report lighting of his skin recently while on sutent without pruritus.  He denies bleeding or any melena or hematochezia.  He is accompanied by his sister [name].  He states that he is feeling ok with some fatigue.  He does report back spasms intermittently and nausea.   He denies any fevers or chills or acute shortness of breath. He reports compliance to his sutent. He denies any abdominal pain, nausea/vomiting, diarrhea or rashes.   MEDICAL HISTORY: Past Medical History  Diagnosis Date  . Renal disorder   . Hypertension   . Dialysis patient   . Secondary hyperparathyroidism (of renal origin)     s/p parathyroidectomy  . Anemia due to chronic disease treated with erythropoietin   . 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 Closed fracture of pubic ramus 08/2013    pathologic (metastatic renal cell ca)  . 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    ALLERGIES:  has No Known Allergies.  MEDICATIONS: has a current medication list which includes the following prescription(s): albuterol, amlodipine, calcium carbonate, dss, doxepin, hydrocodone-acetaminophen, losartan, multivitamin, ranitidine, eucerin, tramadol, cyclobenzaprine, ferrous sulfate, prochlorperazine, and promethazine.  SURGICAL HISTORY:  Past Surgical History  Procedure Laterality Date  . Av fistula placement      REVIEW OF SYSTEMS:   Constitutional: Denies fevers, chills or abnormal weight loss Eyes: Denies blurriness of vision Ears, nose, mouth, throat, and face: Denies mucositis or sore throat Respiratory: Denies cough, dyspnea or wheezes Cardiovascular: Denies palpitation, chest discomfort or lower extremity swelling Gastrointestinal:  Denies nausea, heartburn or change in bowel habits Skin: Denies abnormal skin rashes Lymphatics: Denies new lymphadenopathy or easy bruising Neurological:Denies numbness, tingling or new weaknesses Behavioral/Psych: Mood is stable, no new changes  All other systems were reviewed with the patient and are negative.  PHYSICAL EXAMINATION: ECOG PERFORMANCE STATUS: 1 - Symptomatic but completely ambulatory  Blood pressure 138/86, pulse 75,  temperature 98 F (36.7 C), temperature source Oral, resp. rate 18, weight 0 lb (0 kg).  GENERAL:alert, mild distress and comfortable; sitting in chair. Thin, chronically ill appearing, slightly lightening of his skin with dryness throughout SKIN: skin color, texture, turgor are normal,  no rashes or significant lesions EYES: normal, Conjunctiva are pink and non-injected, sclera clear OROPHARYNX:no exudate, no erythema and lips, buccal mucosa, and tongue normal ; poor dentition.  NECK: supple, thyroid normal size, non-tender, without nodularity LYMPH:  no palpable lymphadenopathy in the cervical, axillary or supraclavicular LUNGS: clear to auscultation and percussion with normal breathing effort HEART: regular rate & rhythm and no murmurs and no lower extremity edema ABDOMEN:abdomen soft, non-tender and normal bowel sounds Musculoskeletal:no cyanosis of digits and no clubbing ; RUE with AV fistula with palpable thrill NEURO: alert & oriented x 3 with fluent speech,generalized weakness in lower extremities but moves all extremities.   Labs:  Lab Results  Component Value Date   WBC 7.1 10/18/2013   HGB 10.7* 10/18/2013   HCT 31.1* 10/18/2013   MCV 77.8* 10/18/2013   PLT 149 10/18/2013   NEUTROABS 5.4 10/18/2013      Chemistry      Component Value Date/Time   NA 141 10/18/2013 1228   NA 136 08/06/2013 1045   NA 136 08/06/2013 1045   K 4.5 10/18/2013 1228   K 4.7 08/06/2013 1045   K 4.8 08/06/2013 1045   CL 93* 08/06/2013 1045   CL 93* 08/06/2013 1045   CO2 27 10/18/2013 1228   CO2 25 08/06/2013 1045   CO2 25 08/06/2013 1045   BUN 50.6* 10/18/2013 1228   BUN 56* 08/06/2013 1045   BUN 57* 08/06/2013 1045   CREATININE 5.6* 10/18/2013 1228   CREATININE 8.52* 08/06/2013 1045   CREATININE 8.61* 08/06/2013 1045      Component Value Date/Time   CALCIUM 9.8 10/18/2013 1228   CALCIUM 9.2 08/06/2013 1045   CALCIUM 9.2 08/06/2013 1045   ALKPHOS 149 10/18/2013 1228   ALKPHOS 86 08/03/2013 1442   AST 700* 10/18/2013 1228   AST 14 08/03/2013 1442   ALT 938* 10/18/2013 1228   ALT 10 08/03/2013 1442   BILITOT 0.75 10/18/2013 1228   BILITOT 0.3 08/03/2013 1442     Studies:  No results found.   RADIOGRAPHIC STUDIES: Dg Lumbar Spine 2-3 Views  07/31/2013   CLINICAL DATA:  Low back pain with  recent metastatic diagnosis  EXAM: LUMBAR SPINE - 2-3 VIEW  COMPARISON:  Contemporaneously body CT.  FINDINGS: Extensive metastatic disease of the lumbar spine is under appreciated by radiography. There is a lytic lesion in the posterior L5 body and an extensive permeative lesion in the L2 body. There is a superior endplate fracture of L2 noted. Patchy sclerosis, present in the lower L2, upper L3, lower L4, and upper L5 bodies that is most likely degenerative in nature.  IMPRESSION: 1. Metastatic disease in the L2 and L5 bodies. 2. Pathologic L2 superior endplate fracture with mild compression.   Electronically Signed   By: Jorje Guild M.D.   On: 07/31/2013 22:23   Ct Chest W Contrast   07/31/2013   CLINICAL DATA:  Bone lesions.  Renal failure.  EXAM: CT CHEST, ABDOMEN, AND PELVIS WITH CONTRAST  TECHNIQUE: Multidetector CT imaging of the chest, abdomen and pelvis was performed following the standard protocol during bolus administration of intravenous contrast.  CONTRAST:  54mL OMNIPAQUE IOHEXOL 300 MG/ML  SOLN  COMPARISON:  07/11/2005  FINDINGS: CT CHEST  FINDINGS  Hyperemic 2.3 cm right paratracheal lymph node. Smaller enlarged precarinal, subcarinal, pretracheal, and prevascular lymph nodes. Trace left small and right pleural effusions. There is a permeative lesion in the proximal right 9th rib with surrounding soft tissue and associated pathologic fracture. Smaller lytic lesion with path fracture through lateral aspect right 6th rib. Old healed left rib fractures. Small subpleural blebs in both upper lobes. There is dependent atelectasis in both lower lobes, right greater than left, with no discrete mass identified.  CT ABDOMEN AND PELVIS FINDINGS  Surgical clips in the gallbladder fossa. Unremarkable liver, spleen, adrenal glands. Innumerable cysts throughout both kidneys. 4.5 cm enhancing mass in the interpolar region of the left kidney. 3.3 cm enhancing mass in the upper pole of the right kidney. Renal  veins are patent. No retroperitoneal adenopathy. Atheromatous nondilated abdominal aorta. Stomach, small bowel and colon are nondilated. Expansile lytic enhancing masses involve the superior left pubic ramus and anterior acetabulum, L2 with pathologic fracture, L5 vertebral body, posterior right iliac bone, and intertrochanteric region of the right femur. Enlarged 14 mm right inguinal lymph node. Trace pelvic ascites. No free air.  IMPRESSION: 1. Bilateral enhancing renal masses suggesting either synchronous renal cell carcinoma or metastatic disease. 2. 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 3. Mediastinal adenopathy. 4. Pleural effusions, right greater than left. 5. Innumerable renal cysts suggesting polycystic kidney disease.   Electronically Signed   By: Arne Cleveland M.D.   On: 07/31/2013 22:25   Ct Abdomen Pelvis W Contrast    07/31/2013   CLINICAL DATA:  Bone lesions.  Renal failure.  EXAM: CT CHEST, ABDOMEN, AND PELVIS WITH CONTRAST  TECHNIQUE: Multidetector CT imaging of the chest, abdomen and pelvis was performed following the standard protocol during bolus administration of intravenous contrast.  CONTRAST:  58mL OMNIPAQUE IOHEXOL 300 MG/ML  SOLN  COMPARISON:  07/11/2005  FINDINGS: CT CHEST FINDINGS  Hyperemic 2.3 cm right paratracheal lymph node. Smaller enlarged precarinal, subcarinal, pretracheal, and prevascular lymph nodes. Trace left small and right pleural effusions. There is a permeative lesion in the proximal right 9th rib with surrounding soft tissue and associated pathologic fracture. Smaller lytic lesion with path fracture through lateral aspect right 6th rib. Old healed left rib fractures. Small subpleural blebs in both upper lobes. There is dependent atelectasis in both lower lobes, right greater than left, with no discrete mass identified.  CT ABDOMEN AND PELVIS FINDINGS  Surgical clips in the gallbladder fossa.  Unremarkable liver, spleen, adrenal glands. Innumerable cysts throughout both kidneys. 4.5 cm enhancing mass in the interpolar region of the left kidney. 3.3 cm enhancing mass in the upper pole of the right kidney. Renal veins are patent. No retroperitoneal adenopathy. Atheromatous nondilated abdominal aorta. Stomach, small bowel and colon are nondilated. Expansile lytic enhancing masses involve the superior left pubic ramus and anterior acetabulum, L2 with pathologic fracture, L5 vertebral body, posterior right iliac bone, and intertrochanteric region of the right femur. Enlarged 14 mm right inguinal lymph node. Trace pelvic ascites. No free air.  IMPRESSION: 1. Bilateral enhancing renal masses suggesting either synchronous renal cell carcinoma or metastatic disease. 2. 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 3. Mediastinal adenopathy. 4. Pleural effusions, right greater than left. 5. Innumerable renal cysts suggesting polycystic kidney disease.   Electronically Signed   By: Arne Cleveland M.D.   On: 07/31/2013 22:25   Mr Hip Left  Wo Contrast   07/31/2013   CLINICAL DATA:  Severe left hip pain. Abnormal radiographs dated 07/15/2013  EXAM: MRI OF THE LEFT HIP WITHOUT CONTRAST  TECHNIQUE: Multiplanar, multisequence MR imaging was performed. No intravenous contrast was administered.  COMPARISON:  Radiographs dated 07/15/2013  FINDINGS: There is a 6 x 6 x 5 cm inhomogeneous destructive soft tissue mass destroying the anterior medial and anterior superior aspects of the left acetabulum and the lateral aspect of the left superior pubic ramus. The tumor protrudes into the pelvis. Tumor extends into the left internal obturator muscle.  There is an expansile 2 cm mass in the posterior aspect of the L5 vertebral body slightly protruding into the spinal canal. There is a 2.5 cm destructive lesion in the posterior aspect of the left iliac bone  adjacent to the superior aspect of the left sacroiliac joint. There is a 16 mm destructive lesion in the posterior lateral aspect of the proximal right femur just below the right greater trochanter. There is a 4 cm destructive lesion in the medullary canal of the proximal right femoral shaft.  The patient does have fairly severe arthritis of the superior aspect of the left femoral head with joint space narrowing and degenerative changes of the remaining portion of the left acetabulum. There small bilateral hip effusions. There is extensive edema in the adjacent soft tissues around the left hip. The edema extends into the left side of the pelvis.  2.7 cm cyst in the right groin. This is of unknown etiology but appears benign.  IMPRESSION: 1. Numerous destructive bone lesions, with the largest lesion destroying the left acetabulum, consistent with metastatic disease. 2. 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 3. Critical Value/emergent results were called by telephone at the time of interpretation on 07/31/2013 at 1:15 PM to Dr.KELLIE [facility] , who verbally acknowledged these results.   Electronically Signed   By: Rozetta Nunnery M.D.   On: 07/31/2013 13:25   Ir Pta Venous Right  08/03/2013   CLINICAL DATA:  63 year old with end-stage renal disease and right arm fistula. The patient has right arm swelling.  EXAM: RIGHT UPPER EXTREMITY FISTULOGRAM; CENTRAL VENOUS ANGIOPLASTY  Physician: Stephan Minister. Anselm Pancoast, MD  MEDICATIONS AND MEDICAL HISTORY: Fentanyl 50 mcg.  FLUOROSCOPY TIME:  2 min and 48 seconds  PROCEDURE: The procedure was explained to the patient. The risks and benefits of the procedure were discussed and the patient's questions were addressed. Informed consent was obtained from the patient. Right upper extremity fistula was accessed with an angiocath. Fistulogram images were obtained. Arm was prepped and draped in a sterile fashion. Maximal barrier sterile technique was utilized including caps, mask,  sterile gowns, sterile gloves, sterile drape, hand hygiene and skin antiseptic. The skin was anesthetized with 1% lidocaine. Angiocath was exchanged for a 7-French vascular sheath over a Bentson wire. A 5 [place] Kumpe catheter was easily advanced into the right central veins. Additional angiography was performed. A Bentson wire was easily advanced into the superior vena cava and the inferior vena cava. A 8 mm x 40 mm Conquest balloon was positioned at the junction of the right innominate vein and SVC. The balloon was inflated without complication. A significant waist formation was noted. Follow up angiography demonstrated improved flow in this area. This focal stenosis was treated a second time with a 10 mm x 40 mm balloon. This balloon was inflated for 2 minutes. The patient complained of some chest discomfort during this balloon inflation. Follow up angiography was performed.  The wire was removed. The catheter was removed with a pursestring suture.  FINDINGS: The right upper arm fistula is patent. There was a focal critical stenosis at the junction of the right innominate vein and SVC. There were large collateral vessels in the right upper chest and neck region. The post angioplasty images demonstrated markedly improved flow through the right innominate vein. There was still some filling of the collateral vessels in the right upper chest and neck.  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 This stenosis was successfully treated with balloon angioplasty.   Electronically Signed   By: Markus Daft M.D.   On: 08/03/2013 12:38   Ct Biopsy  08/02/2013   CLINICAL DATA:  63 year old with renal lesions and multiple bone lesions. Tissue diagnosis is needed.  EXAM: CT BIOPSY OF LEFT PELVIC SOFT TISSUE MASS.  Physician: Stephan Minister. Henn, MD  MEDICATIONS: Versed 2 mg, fentanyl 75 mcg. A radiology nurse monitored the patient for moderate sedation.  ANESTHESIA/SEDATION: Moderate  sedation time: 17 min  PROCEDURE: The procedure was explained to the patient. The risks and benefits of the procedure were discussed and the patient's questions were addressed. Informed consent was obtained from the patient. The patient was placed supine on the CT scanner. Images through the pelvis were obtained. The lesion in the anterior left hip was identified. The left anterior pelvis was prepped and draped in a sterile fashion. Skin was anesthetized with 1% lidocaine. Using CT guidance, a 17 gauge needle was directed into the pelvic soft tissue mass. Needle position was confirmed with CT. Three core biopsies were obtained with an 18 gauge device. Specimens were placed in formalin.  COMPLICATIONS: None  FINDINGS: There is a destructive soft tissue mass involving the anterior aspect of the left iliac bone and the left acetabulum. Needle position was confirmed within the soft tissue lesion.  IMPRESSION: CT-guided biopsy of the left pelvic soft tissue mass.   Electronically Signed   By: Markus Daft M.D.   On: 08/02/2013 17:07   Dg Chest Portable 1 View  07/31/2013   CLINICAL DATA:  The shortness breath. DVT.  EXAM: PORTABLE CHEST - 1 VIEW  COMPARISON:  07/15/2013 and 12/02/2009  FINDINGS: Right base wedge-shaped consolidation. This may represent atelectasis. Pulmonary infarct or infiltrate not excluded. Recommend followup until clearance.  Elevated right hemidiaphragm. This limits evaluation of the right lung base.  Pulmonary vascular prominence most notable centrally.  Cardiomegaly.  Tortuous aorta.  The previously questioned nodule adjacent to the left hemidiaphragm is not delineated on the present exam. Recommend followup two view chest when able with attention to this region.  IMPRESSION: Right base wedge-shaped consolidation. This may represent atelectasis. Pulmonary infarct or infiltrate not excluded.  Pulmonary vascular prominence most notable centrally.  Cardiomegaly.  Tortuous aorta.  The previously  questioned nodule adjacent to the left hemidiaphragm is not delineated on the present exam. Recommend followup two view chest when able with attention to this region.   Electronically Signed   By: Chauncey Cruel M.D.   On: 07/31/2013 17:52   Ir Shuntogram/ Fistulagram Right Mod Sed  08/03/2013   CLINICAL DATA:  63 year old with end-stage renal disease and right arm fistula. The patient has right arm swelling.  EXAM: RIGHT UPPER EXTREMITY FISTULOGRAM; CENTRAL VENOUS ANGIOPLASTY  Physician: Stephan Minister. Anselm Pancoast, MD  MEDICATIONS AND MEDICAL HISTORY: Fentanyl 50 mcg.  FLUOROSCOPY TIME:  2 min and 48 seconds  PROCEDURE: The procedure was explained to the  patient. The risks and benefits of the procedure were discussed and the patient's questions were addressed. Informed consent was obtained from the patient. Right upper extremity fistula was accessed with an angiocath. Fistulogram images were obtained. Arm was prepped and draped in a sterile fashion. Maximal barrier sterile technique was utilized including caps, mask, sterile gowns, sterile gloves, sterile drape, hand hygiene and skin antiseptic. The skin was anesthetized with 1% lidocaine. Angiocath was exchanged for a 7-French vascular sheath over a Bentson wire. A 5 [place] Kumpe catheter was easily advanced into the right central veins. Additional angiography was performed. A Bentson wire was easily advanced into the superior vena cava and the inferior vena cava. A 8 mm x 40 mm Conquest balloon was positioned at the junction of the right innominate vein and SVC. The balloon was inflated without complication. A significant waist formation was noted. Follow up angiography demonstrated improved flow in this area. This focal stenosis was treated a second time with a 10 mm x 40 mm balloon. This balloon was inflated for 2 minutes. The patient complained of some chest discomfort during this balloon inflation. Follow up angiography was performed. The wire was removed. The catheter  was removed with a pursestring suture.  FINDINGS: The right upper arm fistula is patent. There was a focal critical stenosis at the junction of the right innominate vein and SVC. There were large collateral vessels in the right upper chest and neck region. The post angioplasty images demonstrated markedly improved flow through the right innominate vein. There was still some filling of the collateral vessels in the right upper chest and neck.  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 This stenosis was successfully treated with balloon angioplasty.   Electronically Signed   By: Markus Daft M.D.   On: 08/03/2013 12:38   PATHOLOGY: Soft Tissue Needle Core Biopsy, left anterior pelvis, acetabular - METASTATIC RENAL CELL CARCINOMA, CLEAR CELL TYPE, SEE COMMENT. Microscopic Comment The history of "renal lesions" is noted. The biopsy findings demonstrate clear cell neoplasm with morphologicfeatures consistent with metastatic renal cell carcinoma, clear cell type. The case was reviewed with Dr. Nicoletta Dress who concurs. (CRR:gt, 08/05/13)  Mali RUND DO Pathologist, Electronic Signature (Case signed 08/05/2013)  ASSESSMENT: [name] 63 y.o. 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 - Plan: prochlorperazine (COMPAZINE) 10 MG tablet, cyclobenzaprine (FLEXERIL) 10 MG tablet, CBC with Differential, Comprehensive metabolic panel (Cmet) - [facility], Lactate dehydrogenase (LDH) - [facility], CT Chest W Contrast, CT Abdomen Pelvis W Contrast, Ambulatory referral to Nutrition and Diabetic Education, Comprehensive metabolic panel (Cmet) - [facility], Comprehensive metabolic panel, Comprehensive metabolic panel, Comprehensive metabolic panel  Anemia, unspecified - Plan: ferrous sulfate 325 (65 FE) MG EC tablet  Nausea with vomiting - Plan: prochlorperazine (COMPAZINE) 10 MG tablet  Muscle spasm of back - Plan: cyclobenzaprine (FLEXERIL) 10 MG tablet  Hepatitis   PLAN:   1. 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  Risk factors are longstanding hypertension, heavier smoker, complex kidney mass although not the typical finding on imaging with 30-40 % presenting with bone metastases. --He tolerates therapy well however he has had a substantial increase in his liver function.   --Adjunctive bone therapy.  He is unlikely to be a candidate for bisphosphonate therapy given ESRDz and/or poor dentition.  We will make referral to dentistry upon next visit.   --We will re-stage on 02/20.   Next therapy likely afinitor upon resolution of his  hepatotoxicity.     2. 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 -- Given elevated liver enzymes, we will discontinue Sutent today.  He will follow up weekly to ensure resolution of his elevated enzymes.  If persists or worsen a referral to GI will be made.   --He was counseled extensively to avoid tylenol and other acetaminophen containing products in the setting of his elevated liver enzymes.   3. ESRDz on H/D. --Continue hemodialysis per nephrology with epo.  4. Hypertension. --Continue amlodipine 10 mg, losartan 25 mg daily.   5. Anemia in chronic kidney disease +/- IDA -- Continue epo and RBC transfusion prn symptoms. Hgb of down from 12.8 to 10.7 today.  Ferrous sulfate 325 mg daily and will repeat on next visit.   6. Pathological fracture of Left hip secondary to #1.   --Completed XRT on 08/21/2013.  Continue pain control with oxycodone 5 mg q 6 hours (two tabs q 4 hours prn moderate pain) and tramadol 50 q 6 hours prn pain.   May add long-acting if this is not enough.   7. Follow-up.  --He will follow up for labs in  Weekly secondary to elevated liver enzymes.       He will follow up for a symptom check in 4 weeks with labs including chemistries and CBC.    All questions were answered. The patient knows to call the clinic with any problems, questions or concerns. We can certainly see the patient much sooner if  necessary.  I spent 25 minutes counseling the patient face to face. The total time spent in the appointment was 40 minutes.    Zoha Spranger, MD 10/20/2013 9:07 AM

## 2013-10-24 ENCOUNTER — Other Ambulatory Visit: Payer: Self-pay | Admitting: Internal Medicine

## 2013-10-24 ENCOUNTER — Ambulatory Visit: Payer: Self-pay | Admitting: Nurse Practitioner

## 2013-10-24 ENCOUNTER — Telehealth: Payer: Self-pay | Admitting: Internal Medicine

## 2013-10-24 ENCOUNTER — Other Ambulatory Visit: Payer: Self-pay

## 2013-10-24 NOTE — Telephone Encounter (Signed)
s.w. pt sister and advised on weekly appts...pt ok and aware

## 2013-10-25 ENCOUNTER — Other Ambulatory Visit: Payer: Medicare Other

## 2013-10-31 ENCOUNTER — Telehealth: Payer: Self-pay | Admitting: Radiation Oncology

## 2013-10-31 NOTE — Telephone Encounter (Signed)
Faxed reports to [facility].  OK per JSM.  Received confirmation.

## 2013-11-01 ENCOUNTER — Telehealth: Payer: Self-pay | Admitting: Medical Oncology

## 2013-11-01 ENCOUNTER — Other Ambulatory Visit (HOSPITAL_BASED_OUTPATIENT_CLINIC_OR_DEPARTMENT_OTHER): Payer: Medicare Other

## 2013-11-01 ENCOUNTER — Other Ambulatory Visit: Payer: Medicare Other

## 2013-11-01 DIAGNOSIS — C799 Secondary malignant neoplasm of unspecified site: Secondary | ICD-10-CM

## 2013-11-01 DIAGNOSIS — Z992 Dependence on renal dialysis: Secondary | ICD-10-CM

## 2013-11-01 DIAGNOSIS — N186 End stage renal disease: Secondary | ICD-10-CM

## 2013-11-01 DIAGNOSIS — N189 Chronic kidney disease, unspecified: Secondary | ICD-10-CM

## 2013-11-01 DIAGNOSIS — C649 Malignant neoplasm of unspecified kidney, except renal pelvis: Secondary | ICD-10-CM

## 2013-11-01 DIAGNOSIS — D631 Anemia in chronic kidney disease: Secondary | ICD-10-CM

## 2013-11-01 DIAGNOSIS — C7951 Secondary malignant neoplasm of bone: Principal | ICD-10-CM

## 2013-11-01 LAB — COMPREHENSIVE METABOLIC PANEL (CC13)
ALT: 56 U/L — ABNORMAL HIGH (ref 0–55)
ANION GAP: 12 meq/L — AB (ref 3–11)
AST: 29 U/L (ref 5–34)
Albumin: 2.5 g/dL — ABNORMAL LOW (ref 3.5–5.0)
Alkaline Phosphatase: 110 U/L (ref 40–150)
BUN: 29.9 mg/dL — AB (ref 7.0–26.0)
CO2: 31 meq/L — AB (ref 22–29)
CREATININE: 5 mg/dL — AB (ref 0.7–1.3)
Calcium: 9.7 mg/dL (ref 8.4–10.4)
Chloride: 100 mEq/L (ref 98–109)
GLUCOSE: 81 mg/dL (ref 70–140)
Potassium: 4.5 mEq/L (ref 3.5–5.1)
SODIUM: 143 meq/L (ref 136–145)
TOTAL PROTEIN: 7.5 g/dL (ref 6.4–8.3)
Total Bilirubin: 0.53 mg/dL (ref 0.20–1.20)

## 2013-11-01 LAB — CBC WITH DIFFERENTIAL/PLATELET
BASO%: 0.9 % (ref 0.0–2.0)
Basophils Absolute: 0.1 10*3/uL (ref 0.0–0.1)
EOS ABS: 0.1 10*3/uL (ref 0.0–0.5)
EOS%: 2 % (ref 0.0–7.0)
HEMATOCRIT: 27.2 % — AB (ref 38.4–49.9)
HGB: 9 g/dL — ABNORMAL LOW (ref 13.0–17.1)
LYMPH%: 18.3 % (ref 14.0–49.0)
MCH: 28 pg (ref 27.2–33.4)
MCHC: 32.9 g/dL (ref 32.0–36.0)
MCV: 85 fL (ref 79.3–98.0)
MONO#: 0.7 10*3/uL (ref 0.1–0.9)
MONO%: 11.7 % (ref 0.0–14.0)
NEUT%: 67.1 % (ref 39.0–75.0)
NEUTROS ABS: 3.9 10*3/uL (ref 1.5–6.5)
Platelets: 219 10*3/uL (ref 140–400)
RBC: 3.2 10*6/uL — ABNORMAL LOW (ref 4.20–5.82)
RDW: 24.5 % — ABNORMAL HIGH (ref 11.0–14.6)
WBC: 5.8 10*3/uL (ref 4.0–10.3)
lymph#: 1.1 10*3/uL (ref 0.9–3.3)

## 2013-11-01 NOTE — Telephone Encounter (Signed)
Received a call from Mrs. Jones-PT with [facility] to notify us that they will be discharging pt. He has met all the goals they had set for pt. She states it is limited due to non weight bearing of the left leg. He has a wheelchair but has to take some steps down to the bathroom. I explained that Dr. Juliann Mule did not make decision regarding the weight bearing. This was under the direction of Dr. Veverly Fells. She voiced understanding but just wanted [place] to be aware.

## 2013-11-06 ENCOUNTER — Telehealth: Payer: Self-pay | Admitting: *Deleted

## 2013-11-06 ENCOUNTER — Ambulatory Visit: Admission: RE | Admit: 2013-11-06 | Payer: Medicare Other | Source: Ambulatory Visit | Admitting: Radiation Oncology

## 2013-11-06 NOTE — Telephone Encounter (Signed)
Error, saw where patient was going to be admitted after I had called and left vm on home phone checking on patient statis 1:39 PM

## 2013-11-06 NOTE — Telephone Encounter (Signed)
[place] stating that she only has 1 more visit with patient, has attempted to discuss living will, DNR status and hospice with patient and sister, but was shut down", also patient Physical Therapist stopped working with the patient because afraid of causing a possible fracture on the patient, which sister was up set this was stopped, and that patient is in need of pain medication refill, thanked Therapist, sports and said I would pas this information on to Dr.moody, patient has 145pm f/u appt today 1:22 PM

## 2013-11-08 ENCOUNTER — Other Ambulatory Visit: Payer: Medicare Other

## 2013-11-13 ENCOUNTER — Ambulatory Visit: Payer: Self-pay | Admitting: Nurse Practitioner

## 2013-11-15 ENCOUNTER — Other Ambulatory Visit (HOSPITAL_BASED_OUTPATIENT_CLINIC_OR_DEPARTMENT_OTHER): Payer: Medicare Other

## 2013-11-15 ENCOUNTER — Other Ambulatory Visit: Payer: Medicare Other

## 2013-11-15 DIAGNOSIS — C7951 Secondary malignant neoplasm of bone: Principal | ICD-10-CM

## 2013-11-15 DIAGNOSIS — C649 Malignant neoplasm of unspecified kidney, except renal pelvis: Secondary | ICD-10-CM

## 2013-11-15 LAB — CBC WITH DIFFERENTIAL/PLATELET
BASO%: 0.5 % (ref 0.0–2.0)
Basophils Absolute: 0 10*3/uL (ref 0.0–0.1)
EOS%: 1.4 % (ref 0.0–7.0)
Eosinophils Absolute: 0.1 10*3/uL (ref 0.0–0.5)
HCT: 26.3 % — ABNORMAL LOW (ref 38.4–49.9)
HGB: 8.7 g/dL — ABNORMAL LOW (ref 13.0–17.1)
LYMPH#: 1.3 10*3/uL (ref 0.9–3.3)
LYMPH%: 22.8 % (ref 14.0–49.0)
MCH: 27.4 pg (ref 27.2–33.4)
MCHC: 33.1 g/dL (ref 32.0–36.0)
MCV: 82.7 fL (ref 79.3–98.0)
MONO#: 0.9 10*3/uL (ref 0.1–0.9)
MONO%: 14.9 % — ABNORMAL HIGH (ref 0.0–14.0)
NEUT#: 3.5 10*3/uL (ref 1.5–6.5)
NEUT%: 60.4 % (ref 39.0–75.0)
Platelets: 240 10*3/uL (ref 140–400)
RBC: 3.18 10*6/uL — AB (ref 4.20–5.82)
RDW: 21.6 % — ABNORMAL HIGH (ref 11.0–14.6)
WBC: 5.8 10*3/uL (ref 4.0–10.3)

## 2013-11-15 LAB — COMPREHENSIVE METABOLIC PANEL (CC13)
ALT: 18 U/L (ref 0–55)
AST: 19 U/L (ref 5–34)
Albumin: 2.9 g/dL — ABNORMAL LOW (ref 3.5–5.0)
Alkaline Phosphatase: 107 U/L (ref 40–150)
Anion Gap: 11 mEq/L (ref 3–11)
BUN: 31.6 mg/dL — ABNORMAL HIGH (ref 7.0–26.0)
CHLORIDE: 99 meq/L (ref 98–109)
CO2: 30 meq/L — AB (ref 22–29)
Calcium: 9.8 mg/dL (ref 8.4–10.4)
Creatinine: 4.6 mg/dL (ref 0.7–1.3)
GLUCOSE: 88 mg/dL (ref 70–140)
Potassium: 4.4 mEq/L (ref 3.5–5.1)
Sodium: 141 mEq/L (ref 136–145)
Total Bilirubin: 0.4 mg/dL (ref 0.20–1.20)
Total Protein: 8 g/dL (ref 6.4–8.3)

## 2013-11-18 NOTE — Telephone Encounter (Signed)
1 

## 2013-11-20 ENCOUNTER — Ambulatory Visit (HOSPITAL_COMMUNITY)
Admission: RE | Admit: 2013-11-20 | Discharge: 2013-11-20 | Disposition: A | Discharge status: Home / Self Care | Payer: Medicare Other | Source: Ambulatory Visit | Attending: Internal Medicine | Admitting: Internal Medicine

## 2013-11-20 ENCOUNTER — Ambulatory Visit: Admission: RE | Admit: 2013-11-20 | Payer: Medicare Other | Source: Ambulatory Visit | Admitting: Radiation Oncology

## 2013-11-20 ENCOUNTER — Other Ambulatory Visit (HOSPITAL_BASED_OUTPATIENT_CLINIC_OR_DEPARTMENT_OTHER): Payer: Medicare Other

## 2013-11-20 ENCOUNTER — Encounter (HOSPITAL_COMMUNITY): Payer: Self-pay

## 2013-11-20 ENCOUNTER — Telehealth: Payer: Self-pay | Admitting: *Deleted

## 2013-11-20 DIAGNOSIS — C7951 Secondary malignant neoplasm of bone: Secondary | ICD-10-CM

## 2013-11-20 DIAGNOSIS — C799 Secondary malignant neoplasm of unspecified site: Secondary | ICD-10-CM

## 2013-11-20 DIAGNOSIS — G8929 Other chronic pain: Secondary | ICD-10-CM | POA: Insufficient documentation

## 2013-11-20 DIAGNOSIS — C649 Malignant neoplasm of unspecified kidney, except renal pelvis: Secondary | ICD-10-CM | POA: Insufficient documentation

## 2013-11-20 DIAGNOSIS — M25559 Pain in unspecified hip: Secondary | ICD-10-CM | POA: Insufficient documentation

## 2013-11-20 DIAGNOSIS — N189 Chronic kidney disease, unspecified: Secondary | ICD-10-CM

## 2013-11-20 DIAGNOSIS — N186 End stage renal disease: Secondary | ICD-10-CM

## 2013-11-20 DIAGNOSIS — M549 Dorsalgia, unspecified: Secondary | ICD-10-CM | POA: Insufficient documentation

## 2013-11-20 DIAGNOSIS — C7952 Secondary malignant neoplasm of bone marrow: Secondary | ICD-10-CM

## 2013-11-20 DIAGNOSIS — Z992 Dependence on renal dialysis: Secondary | ICD-10-CM

## 2013-11-20 DIAGNOSIS — D631 Anemia in chronic kidney disease: Secondary | ICD-10-CM

## 2013-11-20 HISTORY — DX: Disorder of kidney and ureter, unspecified: N28.9

## 2013-11-20 LAB — COMPREHENSIVE METABOLIC PANEL (CC13)
ALT: 15 U/L (ref 0–55)
AST: 17 U/L (ref 5–34)
Albumin: 3.1 g/dL — ABNORMAL LOW (ref 3.5–5.0)
Alkaline Phosphatase: 108 U/L (ref 40–150)
Anion Gap: 12 mEq/L — ABNORMAL HIGH (ref 3–11)
BUN: 28.8 mg/dL — ABNORMAL HIGH (ref 7.0–26.0)
CALCIUM: 10.1 mg/dL (ref 8.4–10.4)
CHLORIDE: 99 meq/L (ref 98–109)
CO2: 29 mEq/L (ref 22–29)
Creatinine: 5.2 mg/dL (ref 0.7–1.3)
Glucose: 88 mg/dl (ref 70–140)
POTASSIUM: 5.1 meq/L (ref 3.5–5.1)
SODIUM: 139 meq/L (ref 136–145)
Total Bilirubin: 0.44 mg/dL (ref 0.20–1.20)
Total Protein: 8.2 g/dL (ref 6.4–8.3)

## 2013-11-20 MED ORDER — IOHEXOL 300 MG/ML  SOLN
80.0000 mL | Freq: Once | INTRAMUSCULAR | Status: AC | PRN
  Administered 2013-11-20: 80 mL via INTRAVENOUS

## 2013-11-20 NOTE — Telephone Encounter (Signed)
Patient in CT department.  Sister Kandice Moos wrote he needs "all of medicines, I don't have them with me, you all have them on file".  Went to lobby and she has left.  Patient has fourteen medicines on his med list.  Called mobile number, spoke with Kandice Moos to learn "[facility] on [address] has not received a response for his refills.  He definitely needs the b/p medicine and potassium.  He does not have an appointment with [facility] on [address] [phone]) until next month.  Has dialysis with Dr. Verner Chol tomorrow.  Has never seen a primaray care, only dialysis."  Kandice Moos will ask the dialysis provider tomorrow for refills because they have filled medicines before.

## 2013-11-22 ENCOUNTER — Telehealth: Payer: Self-pay | Admitting: Internal Medicine

## 2013-11-22 ENCOUNTER — Ambulatory Visit (HOSPITAL_BASED_OUTPATIENT_CLINIC_OR_DEPARTMENT_OTHER): Payer: Medicare Other | Admitting: Internal Medicine

## 2013-11-22 ENCOUNTER — Encounter (INDEPENDENT_AMBULATORY_CARE_PROVIDER_SITE_OTHER): Payer: Self-pay

## 2013-11-22 VITALS — BP 123/74 | HR 84 | Temp 97.9°F | Resp 17 | Ht 67.0 in | Wt 121.8 lb

## 2013-11-22 DIAGNOSIS — N186 End stage renal disease: Secondary | ICD-10-CM

## 2013-11-22 DIAGNOSIS — C649 Malignant neoplasm of unspecified kidney, except renal pelvis: Secondary | ICD-10-CM

## 2013-11-22 DIAGNOSIS — D631 Anemia in chronic kidney disease: Secondary | ICD-10-CM

## 2013-11-22 DIAGNOSIS — M25559 Pain in unspecified hip: Secondary | ICD-10-CM

## 2013-11-22 DIAGNOSIS — Z992 Dependence on renal dialysis: Secondary | ICD-10-CM

## 2013-11-22 DIAGNOSIS — C7951 Secondary malignant neoplasm of bone: Secondary | ICD-10-CM

## 2013-11-22 DIAGNOSIS — N039 Chronic nephritic syndrome with unspecified morphologic changes: Secondary | ICD-10-CM

## 2013-11-22 DIAGNOSIS — C7952 Secondary malignant neoplasm of bone marrow: Secondary | ICD-10-CM

## 2013-11-22 DIAGNOSIS — R630 Anorexia: Secondary | ICD-10-CM

## 2013-11-22 MED ORDER — HYDROCODONE-ACETAMINOPHEN 5-325 MG PO TABS: 1.0000 | ORAL_TABLET | Freq: Two times a day (BID) | ORAL | Status: DC | PRN

## 2013-11-22 MED ORDER — TRAMADOL HCL 50 MG PO TABS: 50.0000 mg | ORAL_TABLET | Freq: Four times a day (QID) | ORAL | Status: DC | PRN

## 2013-11-22 NOTE — Telephone Encounter (Signed)
Gave pt appt for lab and MD on February and March 2015

## 2013-11-24 ENCOUNTER — Encounter: Payer: Self-pay | Admitting: Internal Medicine

## 2013-11-24 DIAGNOSIS — R7401 Elevation of levels of liver transaminase levels: Secondary | ICD-10-CM | POA: Insufficient documentation

## 2013-11-24 DIAGNOSIS — R740 Nonspecific elevation of levels of transaminase and lactic acid dehydrogenase [LDH]: Secondary | ICD-10-CM

## 2013-11-24 NOTE — Progress Notes (Signed)
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 Held as noted above.     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  Seen 2 weeks before by PCP/Orthopedics with X-ray of hip and CXR revealing lytic sxpansile lesion with pathologic fracture, left inferior lesion.  Onc (Dr. Juliann Mule) consulted on 10/29; Rad onc (Dr. Lisbeth Renshaw) on 11/01   07/31/2013 Imaging CT C/A/P hyperemic 2.3 cm R paratracheal NL, smaller enlarged precarinal, subcarinal, pretracheal, adn prevascular LN, lesion in the proximal R 9th rib, smaller lytic lesion with path fracture of lateral R 6 th rib; 4.5 cm L kidney mass; 3 cm R kid mass   07/31/2013 Imaging MRI,  L hip on admission revealed a 6x6x5 cm homogeneous destructive soft tissue mass destroying the anterior medial and anterior superior aspects of the left acetabulum and lateral aspect of the left surperior pubic ramus.  2 cm mass in the post L5.   08/02/2013 Procedure CT Guide biopsy of left pelci soft tissue mass.    08/05/2013 Pathology Metastatic renal cell carcinoma, clear cell type.     08/05/2013 Cancer Staging Stage IV, RCC with metastases to the bone and liver.    08/08/2013 - 08/21/2013 Radiation Therapy Treated to 3 distinct areas corresponding to the lumbar spine from approximately L1-L5, the left  hip/pelvis, in the proximal right femur. Each of these separate target areas were treated with 2 fields each. The target areas each received 30 gray in 10 fra   08/24/2013 - 10/17/2013 Chemotherapy Started Sutent 50 mg on Days 1-28, off days 29-42.     10/17/2013 Adverse Reaction Grade 4 elevated Transiminases with normal Bilirubin.  Sutent held until resolution.   11/17/2013 Imaging Re-staging CT C/A/P:Apparent interval response to therapy with decrease in mediastinal LAD and a lesion in the posterior right iliac bone.Other bony metastases and bilateral heterogeneously enhancing renallesions are not subtantial changed.   11/22/2013 -  Chemotherapy Sutent 50 mg on Days 1-28, off days 29-42 is restarted.   Liver enzymes normal. Referral to hepatology if increases again.  Consider afinitor as an alternative treatment.     INTERVAL HISTORY: Jeanluc Wegman 63 y.o. male with a history of Stage IV ccRCC with mets to bone is here for follow-up. He was last seen by me on 10/17/2013.  Today, he is accompanied by his sister.  He reports worsening pain to his right hip./  He was discharged from his nursing home at [facility].  He has had physical therapy until recently.  He denies any fevers or chills or acute shortness of breath.  He denies any yellowing of his eyes.  He has been off sutent due to elevated liver enzymes.  He denies abdominal pain, nausea/vomiting, diarrhea or rashes. He is scheduling an appointment to see a dentist for possible dental extractions.   MEDICAL HISTORY: Past Medical History  Diagnosis Date  . Renal disorder   .  Hypertension   . Dialysis patient   . Secondary hyperparathyroidism (of renal origin)     s/p parathyroidectomy  . Anemia due to chronic disease treated with erythropoietin   . Closed fracture of pubic ramus 08/2013    pathologic (metastatic renal cell ca)  . History of radiation therapy 08/08/13-08/21/13    L-1-L5, ,lt hip/pelvis,prox rt femur  .  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    ALLERGIES:  has No Known Allergies.  MEDICATIONS: has a current medication list which includes the following prescription(s): albuterol, amlodipine, calcium carbonate, cyclobenzaprine, dss, doxepin, ferrous sulfate, hydrocodone-acetaminophen, losartan, multivitamin, prochlorperazine, promethazine, ranitidine, eucerin, and tramadol.  SURGICAL HISTORY:  Past Surgical History  Procedure Laterality Date  . Av fistula placement      REVIEW OF SYSTEMS:   Constitutional: Denies fevers, chills or abnormal weight loss; he reports a diminished appeitite.   Eyes: Denies blurriness of vision Ears, nose, mouth, throat, and face: Denies mucositis or sore throat Respiratory: Denies cough, dyspnea or wheezes Cardiovascular: Denies palpitation, chest discomfort or lower extremity swelling Gastrointestinal:  Denies nausea, heartburn or change in bowel habits Skin: Denies abnormal skin rashes Lymphatics: Denies new lymphadenopathy or easy bruising Neurological:Denies numbness, tingling or new weaknesses Behavioral/Psych: Mood is stable, no new changes  All other systems were reviewed with the patient and are negative.  PHYSICAL EXAMINATION: ECOG PERFORMANCE STATUS: 2 - Symptomatic, <50% confined to bed  Blood pressure 123/74, pulse 84, temperature 97.9 F (36.6 C), temperature source Oral, resp. rate 17, height 5\' 7"  (1.702 m), weight 121 lb 12.8 oz (55.248 kg), SpO2 100.00%.  GENERAL:alert, no distress and comfortable; chronically ill appearing thin male with dry skin.   SKIN: skin color, texture, turgor are normal, no rashes or significant lesions EYES: normal, Conjunctiva are pink and non-injected, sclera clear OROPHARYNX:no exudate, no erythema and lips, buccal mucosa, and tongue normal; Poor dentition.  NECK: supple, thyroid normal size, non-tender, without nodularity LYMPH:  no palpable lymphadenopathy in the cervical, axillary or supraclavicular LUNGS: clear to auscultation with normal breathing effort, no wheezes or rhonchi HEART: regular rate & rhythm and no murmurs and no lower extremity edema ABDOMEN:abdomen soft, non-tender and normal bowel sounds Musculoskeletal:no cyanosis of digits and no clubbing; R AV fistula.  NEURO: alert & oriented x 3 with fluent speech, no focal motor/sensory deficits  Labs:  Lab Results  Component Value Date   WBC 5.8 11/15/2013   HGB 8.7* 11/15/2013   HCT 26.3* 11/15/2013   MCV 82.7 11/15/2013   PLT 240 11/15/2013   NEUTROABS 3.5 11/15/2013      Chemistry      Component Value Date/Time   NA 139 11/20/2013 1057   NA 136 08/06/2013 1045   NA 136 08/06/2013 1045   K 5.1 11/20/2013 1057   K 4.7 08/06/2013 1045   K 4.8 08/06/2013 1045   CL 93* 08/06/2013 1045   CL 93* 08/06/2013 1045   CO2 29 11/20/2013 1057   CO2 25 08/06/2013 1045   CO2 25 08/06/2013 1045   BUN 28.8* 11/20/2013 1057   BUN 56* 08/06/2013 1045   BUN 57* 08/06/2013 1045  CREATININE 5.2* 11/20/2013 1057   CREATININE 8.52* 08/06/2013 1045   CREATININE 8.61* 08/06/2013 1045      Component Value Date/Time   CALCIUM 10.1 11/20/2013 1057   CALCIUM 9.2 08/06/2013 1045   CALCIUM 9.2 08/06/2013 1045   ALKPHOS 108 11/20/2013 1057   ALKPHOS 86 08/03/2013 1442   AST 17 11/20/2013 1057   AST 14 08/03/2013 1442   ALT 15 11/20/2013 1057   ALT 10 08/03/2013 1442   BILITOT 0.44 11/20/2013 1057   BILITOT 0.3 08/03/2013 1442       Basic Metabolic Panel:  Recent Labs Lab 11/20/13 1057  NA 139  K 5.1  CO2 29  GLUCOSE 88  BUN 28.8*  CREATININE 5.2*  CALCIUM 10.1    GFR Estimated Creatinine Clearance: 11.6 ml/min (by C-G formula based on Cr of 5.2). Liver Function Tests:  Recent Labs Lab 11/20/13 1057  AST 17  ALT 15  ALKPHOS 108  BILITOT 0.44  PROT 8.2  ALBUMIN 3.1*    Studies:  No results found.   RADIOGRAPHIC STUDIES: Ct Chest W Contrast  (Reviewed by me personally)  11/20/2013   CLINICAL DATA:  Chronic back and right hip pain. Metastatic renal cell carcinoma.  EXAM: CT ABDOMEN AND PELVIS WITH CONTRAST  TECHNIQUE: Multidetector CT imaging of the abdomen and pelvis was performed using the standard protocol following bolus administration of intravenous contrast.  CONTRAST:  30mL OMNIPAQUE IOHEXOL 300 MG/ML  SOLN  COMPARISON:  07/31/2013  FINDINGS: There is some body wall edema in the upper right hemi thorax. Borderline enlarged lymph nodes are identified in the right axilla. 9 mm short axis high right paratracheal lymph node is identified and measured 12 mm in short axis on the previous exam. The. A heterogeneously enhancing 15 mm short axis low right paratracheal lymph node is associated in this lymph node was 2.3 cm in short axis previously. No hilar lymphadenopathy. Heart size is mildly enlarged. Coronary artery calcification is noted. No pericardial effusion. The right pleural effusion seen previously has resolved in the interval.  Lung windows show paraseptal emphysema in the upper lobes, right greater than left. No focal airspace consolidation. No evidence for pulmonary edema. No pulmonary parenchymal nodule or mass.  Posterior medial right ninth rib lesion measures 2.1 x 3.5 cm today compared to 2.2 x 4.0 cm previously. There is associated pathologic fracture of this rib. 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 Lucency at the fracture site suggests that this is also a pathologic process. Multiple healed fractures are seen in posterior left-sided ribs.  No focal abnormality is seen in the liver or spleen. The  stomach, duodenum, pancreas and adrenal glands are unremarkable. The gallbladder is surgically absent. 16 mm water density lesion in the central mesentery is unchanged in the interval.  Innumerable cysts are seen in the kidneys bilaterally. 1.4 cm enhancing lesion extends exophytic from the upper pole of the right kidney and measured 1.3 cm previously. Just deep to this lesion is a second heterogeneously enhancing mass in the upper right kidney that measures 2.7 x 3.2 cm. This lesion was 2.7 x 3.0 cm previously when remeasured in the same dimensions.  Heterogeneously enhancing lesion in the interpolar left kidney is 3.9 x 4.6 cm today compared to 3.9 x 4.5 cm previously.  No evidence for retroperitoneal lymphadenopathy. There is atherosclerotic calcification in the wall of the abdominal aorta without aneurysm. No free fluid in the abdomen.  Imaging through the pelvis shows no free  intraperitoneal fluid. No pelvic sidewall lymphadenopathy. The bladder is decompressed. Prominent stool volume noted in the rectum. The large amount of stool throughout the length of the colon suggest clinical constipation.  Bone windows again show a large lytic lesion involving the anterior and superior left acetabulum. The lytic lesion in the posterior L5 vertebral body extends into the anterior epidural space as before. Eighty lesion in the posterior right iliac bone measures 2.3 x 1.9 cm today compared to 2.6 x 2.2 cm previously. Lytic lesions are again seen in the L2 and L3 vertebral bodies. There is evidence of tumor extension into the anterior epidural space at L2 was stable imaging features.  IMPRESSION: Apparent interval response to therapy with decrease in mediastinal lymphadenopathy and a lesion in the posterior right iliac bone. Other bony metastases and bilateral heterogeneously enhancing renal lesions are not substantially changed in the interval.   Electronically Signed   By: Misty Stanley M.D.   On: 11/20/2013 12:04   Ct  Abdomen Pelvis W Contrast (Reviewed by me personally)  11/20/2013   CLINICAL DATA:  Chronic back and right hip pain. Metastatic renal cell carcinoma.  EXAM: CT ABDOMEN AND PELVIS WITH CONTRAST  TECHNIQUE: Multidetector CT imaging of the abdomen and pelvis was performed using the standard protocol following bolus administration of intravenous contrast.  CONTRAST:  64mL OMNIPAQUE IOHEXOL 300 MG/ML  SOLN  COMPARISON:  07/31/2013  FINDINGS: There is some body wall edema in the upper right hemi thorax. Borderline enlarged lymph nodes are identified in the right axilla. 9 mm short axis high right paratracheal lymph node is identified and measured 12 mm in short axis on the previous exam. The. A heterogeneously enhancing 15 mm short axis low right paratracheal lymph node is associated in this lymph node was 2.3 cm in short axis previously. No hilar lymphadenopathy. Heart size is mildly enlarged. Coronary artery calcification is noted. No pericardial effusion. The right pleural effusion seen previously has resolved in the interval.  Lung windows show paraseptal emphysema in the upper lobes, right greater than left. No focal airspace consolidation. No evidence for pulmonary edema. No pulmonary parenchymal nodule or mass.  Posterior medial right ninth rib lesion measures 2.1 x 3.5 cm today compared to 2.2 x 4.0 cm previously. There is associated pathologic fracture of this rib. 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 Lucency at the fracture site suggests that this is also a pathologic process. Multiple healed fractures are seen in posterior left-sided ribs.  No focal abnormality is seen in the liver or spleen. The stomach, duodenum, pancreas and adrenal glands are unremarkable. The gallbladder is surgically absent. 16 mm water density lesion in the central mesentery is unchanged in the interval.  Innumerable cysts are seen in the kidneys bilaterally. 1.4 cm enhancing lesion extends exophytic  from the upper pole of the right kidney and measured 1.3 cm previously. Just deep to this lesion is a second heterogeneously enhancing mass in the upper right kidney that measures 2.7 x 3.2 cm. This lesion was 2.7 x 3.0 cm previously when remeasured in the same dimensions.  Heterogeneously enhancing lesion in the interpolar left kidney is 3.9 x 4.6 cm today compared to 3.9 x 4.5 cm previously.  No evidence for retroperitoneal lymphadenopathy. There is atherosclerotic calcification in the wall of the abdominal aorta without aneurysm. No free fluid in the abdomen.  Imaging through the pelvis shows no free intraperitoneal fluid. No pelvic sidewall lymphadenopathy. The bladder is decompressed. Prominent stool  volume noted in the rectum. The large amount of stool throughout the length of the colon suggest clinical constipation.  Bone windows again show a large lytic lesion involving the anterior and superior left acetabulum. The lytic lesion in the posterior L5 vertebral body extends into the anterior epidural space as before. Eighty lesion in the posterior right iliac bone measures 2.3 x 1.9 cm today compared to 2.6 x 2.2 cm previously. Lytic lesions are again seen in the L2 and L3 vertebral bodies. There is evidence of tumor extension into the anterior epidural space at L2 was stable imaging features.  IMPRESSION: Apparent interval response to therapy with decrease in mediastinal lymphadenopathy and a lesion in the posterior right iliac bone. Other bony metastases and bilateral heterogeneously enhancing renal lesions are not substantially changed in the interval.   Electronically Signed   By: Misty Stanley M.D.   On: 11/20/2013 12:04    ASSESSMENT: Alfread Srinivas 63 y.o. 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 He was instructed to hold until normalization last visit.  His liver enzymes have now normalized.  We will resume his sutent cautiously given his response to therapy as noted by recent CT C/A/P.   --Adjunctive bone therapy. He is unlikely to be a candidate for bisphosphonate therapy given ESRDz and/or poor dentition. He is scheduled to see dentistry.  --We will re-stage in 2 cycles.  Next therapy likely afinitor perssistent hepatotoxicity.   2. Hepatotoxicity secondary to Sutent, resolved.  --History of hepatitis C.  --He was counseled extensively to avoid tylenol and other acetaminophen containing products in the setting of his elevated liver enzymes.   3. ESRDz on H/D.  --Continue hemodialysis per nephrology with epo.   4. Hypertension.  --Continue amlodipine 10 mg, losartan 25 mg daily.   5. Anemia in chronic kidney disease +/- IDA  -- Continue epo and RBC transfusion prn symptoms.   6. Pathological fracture of Left hip secondary to #1.  --Completed XRT on 08/21/2013. Continue pain control with oxycodone 5 mg q 6 hours (two tabs q 4 hours prn moderate pain) and tramadol 50 q 6 hours prn pain. May add long-acting if this is not enough.  Pain medications provided today.   7. Anorexia and decreased appetite. --Referral to nutrition made.   8. Follow-up.  --He will follow up for labs in every two weeks secondary to his history of elevated liver enzymes. He will follow up for a symptom check in 4 weeks with labs including chemistries and CBC. Time spent filling paperwork for handicap parking.   All questions were answered. The patient knows to call the clinic with any  problems, questions or concerns. We can certainly see the patient much sooner if necessary.  I spent 15 minutes counseling the patient face to face. The total time spent in the appointment was 25 minutes.    Simya Tercero, MD 11/24/2013 11:02 AM

## 2013-11-26 ENCOUNTER — Encounter: Payer: Medicare Other | Admitting: Nutrition

## 2013-12-01 DIAGNOSIS — K219 Gastro-esophageal reflux disease without esophagitis: Secondary | ICD-10-CM | POA: Insufficient documentation

## 2013-12-04 ENCOUNTER — Ambulatory Visit: Payer: Medicare Other | Admitting: Nutrition

## 2013-12-04 NOTE — Progress Notes (Signed)
63 year old male diagnosed with metastatic renal cell cancer.  He is a patient of Dr. Juliann Mule.  Past medical history includes renal disorder on hemodialysis 3 days weekly, hyperparathyroidism, anemia, and hepatitis C.  Medications include Rena-Vite, Compazine, Phenergan.  Labs include BUN 28.8, creatinine 5.2, albumin 3.1.  Height: 67 inches. Weight: 121.8 pounds February 20. BMI: 19.07.  Patient reports his appetite has improved since he was discharged from the nursing home.  He thinks that he is now eating better than he was before he was placed in the nursing home.  Dietary recall reveals patient does consume a variety of foods, but does not appear to follow the renal diet.  Patient able to report fluids are limited to 1200 cc daily.  He apparently has not been drinking enough fluids. He thinks that's why he has been constipated.  Family member volunteers constipation could be a result of pain medication.  Patient unable to provide Usual body weight.  He is able to say that he is thinner than normal.  Nutrition diagnosis: Food and nutrition related knowledge deficit related to new diagnosis of renal cell cancer as evidenced by no prior need for nutrition related information.  Intervention: Patient and family educated to increase oral intake in small, frequent meals and snacks throughout the day.  Educated patient to begin oral nutrition supplements such as Nepro, Juice-based supplements or Ensure Plus between meals.  Reinforced renal diet and fluid restrictions.  Encourage patient to take medications as prescribed.  Provided patient with samples of oral nutrition supplements along with my contact information.  Teach back method used.  Questions were answered.  Monitoring, evaluation, goals: Patient will tolerate renal diet with increased calories to promote repletion of lean body mass.  Patient encouraged to contact dietitian at hemodialysis clinic for further diet education on renal diet.  Next  visit: Patient will contact me with questions or concerns.

## 2013-12-05 ENCOUNTER — Ambulatory Visit: Admission: RE | Admit: 2013-12-05 | Payer: Medicare Other | Source: Ambulatory Visit | Admitting: Radiation Oncology

## 2013-12-06 ENCOUNTER — Other Ambulatory Visit: Payer: Medicare Other

## 2013-12-06 ENCOUNTER — Telehealth: Payer: Self-pay | Admitting: Medical Oncology

## 2013-12-06 NOTE — Telephone Encounter (Signed)
Shurrane called stating that the pt has a nurse that comes in the am but they feel the pt needs help at night. They have a home health agency that has helped them but they were not aware the pt has been diagnosed with cancer. She was asking if I could notify them. She is going to have the agency fax [place] some forms to fill out. I told her we will happy to do anything to get them help. I gave her our fax number.

## 2013-12-13 ENCOUNTER — Telehealth: Payer: Self-pay

## 2013-12-13 NOTE — Telephone Encounter (Signed)
Sister came in and asked if we had done any paperwork for home assistance. She gave me # for Janeece Riggers [phone] ) who we would call to get the form. [employer] and talked with Jermaine. He will fax [place] form to get increased hours for home aid.

## 2013-12-13 NOTE — Telephone Encounter (Signed)
Paperwork from [place] for change in pt needs received and forwarded to [employer]

## 2013-12-20 ENCOUNTER — Other Ambulatory Visit (HOSPITAL_BASED_OUTPATIENT_CLINIC_OR_DEPARTMENT_OTHER): Payer: Medicare Other

## 2013-12-20 ENCOUNTER — Ambulatory Visit (HOSPITAL_BASED_OUTPATIENT_CLINIC_OR_DEPARTMENT_OTHER): Payer: Medicare Other | Admitting: Internal Medicine

## 2013-12-20 ENCOUNTER — Telehealth: Payer: Self-pay | Admitting: Internal Medicine

## 2013-12-20 VITALS — BP 114/72 | HR 80 | Temp 98.3°F | Resp 18 | Ht 67.0 in | Wt 119.1 lb

## 2013-12-20 DIAGNOSIS — C7951 Secondary malignant neoplasm of bone: Secondary | ICD-10-CM

## 2013-12-20 DIAGNOSIS — D631 Anemia in chronic kidney disease: Secondary | ICD-10-CM

## 2013-12-20 DIAGNOSIS — I10 Essential (primary) hypertension: Secondary | ICD-10-CM

## 2013-12-20 DIAGNOSIS — N039 Chronic nephritic syndrome with unspecified morphologic changes: Secondary | ICD-10-CM

## 2013-12-20 DIAGNOSIS — C649 Malignant neoplasm of unspecified kidney, except renal pelvis: Secondary | ICD-10-CM

## 2013-12-20 DIAGNOSIS — C7952 Secondary malignant neoplasm of bone marrow: Secondary | ICD-10-CM

## 2013-12-20 DIAGNOSIS — Z992 Dependence on renal dialysis: Secondary | ICD-10-CM

## 2013-12-20 DIAGNOSIS — M62830 Muscle spasm of back: Secondary | ICD-10-CM

## 2013-12-20 DIAGNOSIS — R630 Anorexia: Secondary | ICD-10-CM

## 2013-12-20 DIAGNOSIS — N186 End stage renal disease: Secondary | ICD-10-CM

## 2013-12-20 LAB — CBC WITH DIFFERENTIAL/PLATELET
BASO%: 2.8 % — AB (ref 0.0–2.0)
Basophils Absolute: 0.1 10*3/uL (ref 0.0–0.1)
EOS%: 6 % (ref 0.0–7.0)
Eosinophils Absolute: 0.2 10*3/uL (ref 0.0–0.5)
HCT: 36.6 % — ABNORMAL LOW (ref 38.4–49.9)
HGB: 11.8 g/dL — ABNORMAL LOW (ref 13.0–17.1)
LYMPH#: 1.1 10*3/uL (ref 0.9–3.3)
LYMPH%: 32.2 % (ref 14.0–49.0)
MCH: 30.1 pg (ref 27.2–33.4)
MCHC: 32.3 g/dL (ref 32.0–36.0)
MCV: 93 fL (ref 79.3–98.0)
MONO#: 0.5 10*3/uL (ref 0.1–0.9)
MONO%: 15.4 % — AB (ref 0.0–14.0)
NEUT#: 1.5 10*3/uL (ref 1.5–6.5)
NEUT%: 43.6 % (ref 39.0–75.0)
Platelets: 224 10*3/uL (ref 140–400)
RBC: 3.94 10*6/uL — ABNORMAL LOW (ref 4.20–5.82)
RDW: 20.6 % — AB (ref 11.0–14.6)
WBC: 3.5 10*3/uL — ABNORMAL LOW (ref 4.0–10.3)

## 2013-12-20 LAB — HEPATIC FUNCTION PANEL
ALK PHOS: 84 U/L (ref 39–117)
ALT: 26 U/L (ref 0–53)
AST: 30 U/L (ref 0–37)
Albumin: 3.7 g/dL (ref 3.5–5.2)
BILIRUBIN INDIRECT: 0.4 mg/dL (ref 0.2–1.2)
Bilirubin, Direct: 0.2 mg/dL (ref 0.0–0.3)
Total Bilirubin: 0.6 mg/dL (ref 0.2–1.2)
Total Protein: 7.7 g/dL (ref 6.0–8.3)

## 2013-12-20 LAB — COMPREHENSIVE METABOLIC PANEL (CC13)
ALK PHOS: 100 U/L (ref 40–150)
ALT: 31 U/L (ref 0–55)
AST: 31 U/L (ref 5–34)
Albumin: 3.2 g/dL — ABNORMAL LOW (ref 3.5–5.0)
Anion Gap: 16 mEq/L — ABNORMAL HIGH (ref 3–11)
BUN: 34.6 mg/dL — ABNORMAL HIGH (ref 7.0–26.0)
CALCIUM: 8.7 mg/dL (ref 8.4–10.4)
CHLORIDE: 100 meq/L (ref 98–109)
CO2: 27 mEq/L (ref 22–29)
CREATININE: 6.5 mg/dL — AB (ref 0.7–1.3)
Glucose: 86 mg/dl (ref 70–140)
Potassium: 5 mEq/L (ref 3.5–5.1)
Sodium: 144 mEq/L (ref 136–145)
Total Bilirubin: 0.49 mg/dL (ref 0.20–1.20)
Total Protein: 8.1 g/dL (ref 6.4–8.3)

## 2013-12-20 LAB — LACTATE DEHYDROGENASE (CC13): LDH: 214 U/L (ref 125–245)

## 2013-12-20 MED ORDER — CYCLOBENZAPRINE HCL 10 MG PO TABS: 10.0000 mg | ORAL_TABLET | Freq: Three times a day (TID) | ORAL | Status: DC | PRN

## 2013-12-20 MED ORDER — HYDROCODONE-ACETAMINOPHEN 5-325 MG PO TABS
1.0000 | ORAL_TABLET | Freq: Once | ORAL | Status: AC
  Administered 2013-12-20: 1 via ORAL

## 2013-12-20 MED ORDER — HYDROCODONE-ACETAMINOPHEN 5-325 MG PO TABS
ORAL_TABLET | ORAL | Status: AC
  Filled 2013-12-20: qty 1

## 2013-12-20 NOTE — Telephone Encounter (Signed)
Gave pt appt for lab and MD  for April 2015 °

## 2013-12-22 NOTE — Progress Notes (Signed)
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 - Plan: HYDROcodone-acetaminophen (NORCO/VICODIN) 5-325 MG per tablet 1 tablet, cyclobenzaprine (FLEXERIL) 10 MG tablet, Comprehensive metabolic panel (Cmet) - [facility], CBC with Differential, Comprehensive metabolic panel (Cmet) - [facility], Lactate dehydrogenase (LDH) - [facility], For home use only DME Walker rolling, For home use only DME 4 wheeled rolling walker with seat, For home use only DME Walker rolling  Muscle spasm of back - Plan: HYDROcodone-acetaminophen (NORCO/VICODIN) 5-325 MG per tablet 1 tablet, cyclobenzaprine (FLEXERIL) 10 MG tablet, For home use only DME Walker rolling, For home use only DME 4 wheeled rolling walker with seat, For home use only DME Walker rolling  Chief Complaint  Patient presents with  . 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  Seen 2 weeks before by PCP/Orthopedics with X-ray of hip and CXR revealing lytic sxpansile lesion with pathologic fracture, left inferior lesion.  Onc (Dr. Juliann Mule) consulted on 10/29; Rad onc (Dr. Lisbeth Renshaw) on 11/01   07/31/2013 Imaging CT C/A/P hyperemic 2.3 cm R paratracheal NL, smaller enlarged precarinal, subcarinal, pretracheal, adn prevascular LN, lesion in the proximal R 9th rib, smaller lytic lesion with path fracture of lateral R 6 th rib; 4.5 cm L kidney mass; 3 cm R kid mass   07/31/2013 Imaging MRI,  L hip on admission revealed a 6x6x5 cm homogeneous destructive soft tissue mass destroying the anterior medial and anterior superior aspects of the left acetabulum and lateral aspect of the left surperior pubic ramus.  2 cm mass in the post L5.    08/02/2013 Procedure CT Guide biopsy of left pelci soft tissue mass.    08/05/2013 Pathology Metastatic renal cell carcinoma, clear cell type.     08/05/2013 Cancer Staging Stage IV, RCC with metastases to the bone.   08/08/2013 - 08/21/2013 Radiation Therapy Treated to 3 distinct areas corresponding to the lumbar spine from approximately L1-L5, the left hip/pelvis, in the proximal right femur. Each of these separate target areas were treated with 2 fields each. The target areas each received 30 gray in 10 fra   08/24/2013 - 10/17/2013 Chemotherapy Started Sutent 50 mg on Days 1-28, off days 29-42.     10/17/2013 Adverse Reaction Grade 4 elevated Transiminases with normal Bilirubin.  Sutent held until resolution.   11/17/2013 Imaging Re-staging CT C/A/P:Apparent interval response to therapy with decrease in mediastinal LAD and a lesion in the posterior right iliac bone.Other bony metastases and bilateral heterogeneously enhancing renallesions are not subtantial changed.   11/22/2013 -  Chemotherapy Sutent 50 mg on Days 1-28, off days 29-42 is restarted.   Liver enzymes normal. Referral to hepatology if increases again.  Consider afinitor as an alternative treatment.     INTERVAL HISTORY: [name] 63 y.o. male with a history of Stage IV ccRCC with mets to bone is here for follow-up. He was last seen by me on 11/22/2013.  Today, he is accompanied by his sister.  He reports stable pain to his right hip.    He denies any fevers or chills or acute shortness of breath.  He denies any yellowing of his eyes.  He has been off  sutent due to elevated liver enzymes.  He denies abdominal pain, nausea/vomiting, diarrhea or rashes. He is scheduling an appointment to see a dentist for possible dental extractions.   MEDICAL HISTORY: Past Medical History  Diagnosis Date  . Renal disorder   . Hypertension   . Dialysis patient   . Secondary hyperparathyroidism (of renal origin)     s/p parathyroidectomy  . Anemia  due to chronic disease treated with erythropoietin   . Closed fracture of pubic ramus 08/2013    pathologic (metastatic renal cell ca)  . History of radiation therapy 08/08/13-08/21/13    L-1-L5, ,lt hip/pelvis,prox rt femur  . 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    ALLERGIES:  has No Known Allergies.  MEDICATIONS: has a current medication list which includes the following prescription(s): albuterol, amlodipine, calcium carbonate, cyclobenzaprine, dss, doxepin, ferrous sulfate, hydrocodone-acetaminophen, losartan, multivitamin, prochlorperazine, promethazine, ranitidine, eucerin, and tramadol.  SURGICAL HISTORY:  Past Surgical History  Procedure Laterality Date  . Av fistula placement      REVIEW OF SYSTEMS:   Constitutional: Denies fevers, chills or abnormal weight loss; he reports a diminished appeitite.   Eyes: Denies blurriness of vision Ears, nose, mouth, throat, and face: Denies mucositis or sore throat Respiratory: Denies cough, dyspnea or wheezes Cardiovascular: Denies palpitation, chest discomfort or lower extremity swelling Gastrointestinal:  Denies nausea, heartburn or change in bowel habits Skin: Denies abnormal skin rashes Lymphatics: Denies new lymphadenopathy or easy bruising Neurological:Denies numbness, tingling or new weaknesses Behavioral/Psych: Mood is stable, no new changes  All other systems were reviewed with the patient and are negative.  PHYSICAL EXAMINATION: ECOG PERFORMANCE STATUS: 2 - Symptomatic, <50% confined to  bed  Blood pressure 114/72, pulse 80, temperature 98.3 F (36.8 C), temperature source Oral, resp. rate 18, height 5\' 7"  (1.702 m), weight 119 lb 1.6 oz (54.023 kg).  GENERAL:alert, no distress and comfortable; chronically ill appearing thin male with dry skin.  SKIN: skin color, texture, turgor are normal, no rashes or significant lesions EYES: normal, Conjunctiva are pink and non-injected, sclera clear OROPHARYNX:no exudate, no erythema and lips, buccal mucosa, and tongue normal; Poor dentition.  NECK: supple, thyroid normal size, non-tender, without nodularity LYMPH:  no palpable lymphadenopathy in the cervical, axillary or supraclavicular LUNGS: clear to auscultation with normal breathing effort, no wheezes or rhonchi HEART: regular rate & rhythm and no murmurs and no lower extremity edema ABDOMEN:abdomen soft, non-tender and normal bowel sounds Musculoskeletal:no cyanosis of digits and no clubbing; R AV fistula.  NEURO: alert & oriented x 3 with fluent speech, no focal motor/sensory deficits  Labs:  Lab Results  Component Value Date   WBC 3.5* 12/20/2013   HGB 11.8* 12/20/2013   HCT 36.6* 12/20/2013   MCV 93.0 12/20/2013   PLT 224 12/20/2013   NEUTROABS 1.5 12/20/2013      Chemistry      Component Value Date/Time   NA 144 12/20/2013 1034   NA 136 08/06/2013 1045   NA 136 08/06/2013 1045   K 5.0 12/20/2013 1034   K 4.7 08/06/2013 1045   K 4.8 08/06/2013 1045   CL  93* 08/06/2013 1045   CL 93* 08/06/2013 1045   CO2 27 12/20/2013 1034   CO2 25 08/06/2013 1045   CO2 25 08/06/2013 1045   BUN 34.6* 12/20/2013 1034   BUN 56* 08/06/2013 1045   BUN 57* 08/06/2013 1045   CREATININE 6.5* 12/20/2013 1034   CREATININE 8.52* 08/06/2013 1045   CREATININE 8.61* 08/06/2013 1045      Component Value Date/Time   CALCIUM 8.7 12/20/2013 1034   CALCIUM 9.2 08/06/2013 1045   CALCIUM 9.2 08/06/2013 1045   ALKPHOS 84 12/20/2013 1034   ALKPHOS 100 12/20/2013 1034   AST 30 12/20/2013 1034   AST 31 12/20/2013 1034    ALT 26 12/20/2013 1034   ALT 31 12/20/2013 1034   BILITOT 0.6 12/20/2013 1034   BILITOT 0.49 12/20/2013 1034       Basic Metabolic Panel:  Recent Labs Lab 12/20/13 1034  NA 144  K 5.0  CO2 27  GLUCOSE 86  BUN 34.6*  CREATININE 6.5*  CALCIUM 8.7   GFR Estimated Creatinine Clearance: 9.1 ml/min (by C-G formula based on Cr of 6.5). Liver Function Tests:  Recent Labs Lab 12/20/13 1034 12/20/13 1034  AST 31 30  ALT 31 26  ALKPHOS 100 84  BILITOT 0.49 0.6  PROT 8.1 7.7  ALBUMIN 3.2* 3.7    Studies:  No results found.   RADIOGRAPHIC STUDIES: Ct Chest W Contrast  (Reviewed by me personally)  11/20/2013   CLINICAL DATA:  Chronic back and right hip pain. Metastatic renal cell carcinoma.  EXAM: CT ABDOMEN AND PELVIS WITH CONTRAST  TECHNIQUE: Multidetector CT imaging of the abdomen and pelvis was performed using the standard protocol following bolus administration of intravenous contrast.  CONTRAST:  75mL OMNIPAQUE IOHEXOL 300 MG/ML  SOLN  COMPARISON:  07/31/2013  FINDINGS: There is some body wall edema in the upper right hemi thorax. Borderline enlarged lymph nodes are identified in the right axilla. 9 mm short axis high right paratracheal lymph node is identified and measured 12 mm in short axis on the previous exam. The. A heterogeneously enhancing 15 mm short axis low right paratracheal lymph node is associated in this lymph node was 2.3 cm in short axis previously. No hilar lymphadenopathy. Heart size is mildly enlarged. Coronary artery calcification is noted. No pericardial effusion. The right pleural effusion seen previously has resolved in the interval.  Lung windows show paraseptal emphysema in the upper lobes, right greater than left. No focal airspace consolidation. No evidence for pulmonary edema. No pulmonary parenchymal nodule or mass.  Posterior medial right ninth rib lesion measures 2.1 x 3.5 cm today compared to 2.2 x 4.0 cm previously. There is associated pathologic  fracture of this rib. 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 Lucency at the fracture site suggests that this is also a pathologic process. Multiple healed fractures are seen in posterior left-sided ribs.  No focal abnormality is seen in the liver or spleen. The stomach, duodenum, pancreas and adrenal glands are unremarkable. The gallbladder is surgically absent. 16 mm water density lesion in the central mesentery is unchanged in the interval.  Innumerable cysts are seen in the kidneys bilaterally. 1.4 cm enhancing lesion extends exophytic from the upper pole of the right kidney and measured 1.3 cm previously. Just deep to this lesion is a second heterogeneously enhancing mass in the upper right kidney that measures 2.7 x 3.2 cm. This lesion was 2.7 x 3.0 cm previously when remeasured in the same dimensions.  Heterogeneously enhancing lesion in the interpolar left kidney is 3.9 x 4.6 cm today compared to 3.9 x 4.5 cm previously.  No evidence for retroperitoneal lymphadenopathy. There is atherosclerotic calcification in the wall of the abdominal aorta without aneurysm. No free fluid in the abdomen.  Imaging through the pelvis shows no free intraperitoneal fluid. No pelvic sidewall lymphadenopathy. The bladder is decompressed. Prominent stool volume noted in the rectum. The large amount of stool throughout the length of the colon suggest clinical constipation.  Bone windows again show a large lytic lesion involving the anterior and superior left acetabulum. The lytic lesion in the posterior L5 vertebral body extends into the anterior epidural space as before. Eighty lesion in the posterior right iliac bone measures 2.3 x 1.9 cm today compared to 2.6 x 2.2 cm previously. Lytic lesions are again seen in the L2 and L3 vertebral bodies. There is evidence of tumor extension into the anterior epidural space at L2 was stable imaging features.  IMPRESSION: Apparent interval response to therapy  with decrease in mediastinal lymphadenopathy and a lesion in the posterior right iliac bone. Other bony metastases and bilateral heterogeneously enhancing renal lesions are not substantially changed in the interval.   Electronically Signed   By: Misty Stanley M.D.   On: 11/20/2013 12:04   Ct Abdomen Pelvis W Contrast (Reviewed by me personally)  11/20/2013   CLINICAL DATA:  Chronic back and right hip pain. Metastatic renal cell carcinoma.  EXAM: CT ABDOMEN AND PELVIS WITH CONTRAST  TECHNIQUE: Multidetector CT imaging of the abdomen and pelvis was performed using the standard protocol following bolus administration of intravenous contrast.  CONTRAST:  53mL OMNIPAQUE IOHEXOL 300 MG/ML  SOLN  COMPARISON:  07/31/2013  FINDINGS: There is some body wall edema in the upper right hemi thorax. Borderline enlarged lymph nodes are identified in the right axilla. 9 mm short axis high right paratracheal lymph node is identified and measured 12 mm in short axis on the previous exam. The. A heterogeneously enhancing 15 mm short axis low right paratracheal lymph node is associated in this lymph node was 2.3 cm in short axis previously. No hilar lymphadenopathy. Heart size is mildly enlarged. Coronary artery calcification is noted. No pericardial effusion. The right pleural effusion seen previously has resolved in the interval.  Lung windows show paraseptal emphysema in the upper lobes, right greater than left. No focal airspace consolidation. No evidence for pulmonary edema. No pulmonary parenchymal nodule or mass.  Posterior medial right ninth rib lesion measures 2.1 x 3.5 cm today compared to 2.2 x 4.0 cm previously. There is associated pathologic fracture of this rib. 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 Lucency at the fracture site suggests that this is also a pathologic process. Multiple healed fractures are seen in posterior left-sided ribs.  No focal abnormality is seen in the liver or  spleen. The stomach, duodenum, pancreas and adrenal glands are unremarkable. The gallbladder is surgically absent. 16 mm water density lesion in the central mesentery is unchanged in the interval.  Innumerable cysts are seen in the kidneys bilaterally. 1.4 cm enhancing lesion extends exophytic from the upper pole of the right kidney and measured 1.3 cm previously. Just deep to this lesion is a second heterogeneously enhancing mass in the upper right kidney that measures 2.7 x 3.2 cm. This lesion was 2.7 x 3.0 cm previously when remeasured in the same dimensions.  Heterogeneously enhancing lesion in the interpolar left kidney is 3.9 x 4.6  cm today compared to 3.9 x 4.5 cm previously.  No evidence for retroperitoneal lymphadenopathy. There is atherosclerotic calcification in the wall of the abdominal aorta without aneurysm. No free fluid in the abdomen.  Imaging through the pelvis shows no free intraperitoneal fluid. No pelvic sidewall lymphadenopathy. The bladder is decompressed. Prominent stool volume noted in the rectum. The large amount of stool throughout the length of the colon suggest clinical constipation.  Bone windows again show a large lytic lesion involving the anterior and superior left acetabulum. The lytic lesion in the posterior L5 vertebral body extends into the anterior epidural space as before. Eighty lesion in the posterior right iliac bone measures 2.3 x 1.9 cm today compared to 2.6 x 2.2 cm previously. Lytic lesions are again seen in the L2 and L3 vertebral bodies. There is evidence of tumor extension into the anterior epidural space at L2 was stable imaging features.  IMPRESSION: Apparent interval response to therapy with decrease in mediastinal lymphadenopathy and a lesion in the posterior right iliac bone. Other bony metastases and bilateral heterogeneously enhancing renal lesions are not substantially changed in the interval.   Electronically Signed   By: Misty Stanley M.D.   On: 11/20/2013  12:04    ASSESSMENT: [name] 63 y.o. 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 - Plan: HYDROcodone-acetaminophen (NORCO/VICODIN) 5-325 MG per tablet 1 tablet, cyclobenzaprine (FLEXERIL) 10 MG tablet, Comprehensive metabolic panel (Cmet) - [facility], CBC with Differential, Comprehensive metabolic panel ([facility]met) - [facility], Lactate dehydrogenase (LDH) - [facility], For home use only DME Walker rolling, For home use only DME 4 wheeled rolling walker with seat, For home use only DME Walker rolling  Muscle spasm of back - Plan: HYDROcodone-acetaminophen (NORCO/VICODIN) 5-325 MG per tablet 1 tablet, cyclobenzaprine (FLEXERIL) 10 MG tablet, For home use only DME Walker rolling, For home use only DME 4 wheeled rolling walker with seat, For home use only DME Walker rolling   PLAN:   PLAN:  1. 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  His liver enzymes have now normalized.  We will continue his sutent cautiously given his response to therapy as noted by recent CT C/A/P.  Tomorrow, he will start his two-week off period.  --Adjunctive bone therapy. He is unlikely to be a candidate for bisphosphonate therapy given ESRDz and/or poor dentition. He is scheduled to see dentistry.  --We will re-stage in 2 cycles.  Next therapy likely afinitor if perssistent hepatotoxicity.   2. Hepatotoxicity secondary to Sutent, resolved.  --History of hepatitis C.  --He was counseled extensively to avoid tylenol and other acetaminophen containing products in the setting of his elevated liver enzymes.   3. ESRDz on H/D.  --Continue hemodialysis per nephrology with epo.   4. Hypertension.  --Continue amlodipine 10 mg, losartan 25 mg daily.   5. Anemia in chronic kidney disease +/- IDA  -- Continue epo and RBC transfusion prn symptoms.   6. Pathological fracture of Left hip secondary to #1.  --Completed XRT on 08/21/2013.  Continue pain control with oxycodone 5 mg q 6 hours (two tabs q 4 hours prn moderate pain) and tramadol 50 q 6 hours prn pain. May add long-acting if this is not enough.  Pain medications provided today.   7. Anorexia and decreased appetite. --Referral to nutrition made. He is supplementing his diet with ensure/boost.   8. Follow-up.  --He will follow up for  labs in every two weeks secondary to his history of elevated liver enzymes. He will follow up for a symptom check in 4 weeks with labs including chemistries and CBC.   All questions were answered. The patient knows to call the clinic with any problems, questions or concerns. We can certainly see the patient much sooner if necessary.  I spent 15 minutes counseling the patient face to face. The total time spent in the appointment was 25 minutes.    Emanuelle Hammerstrom, MD 12/22/2013 10:09 AM

## 2013-12-25 ENCOUNTER — Ambulatory Visit: Payer: Medicare Other | Admitting: Nurse Practitioner

## 2014-01-01 ENCOUNTER — Telehealth: Payer: Self-pay | Admitting: Internal Medicine

## 2014-01-01 NOTE — Telephone Encounter (Signed)
tlkd w/ pt sister Shurrane to adv per Dr Juliann Mule that appt for 4/22 had been chgd to 11:00 lab & 11:30 Dr. Lauro Regulus statd will give pt message

## 2014-01-03 ENCOUNTER — Other Ambulatory Visit: Payer: Self-pay

## 2014-01-03 ENCOUNTER — Other Ambulatory Visit: Payer: Medicare Other

## 2014-01-06 ENCOUNTER — Telehealth: Payer: Self-pay | Admitting: Internal Medicine

## 2014-01-06 NOTE — Telephone Encounter (Signed)
lvm for pt regarding to 4.10.15 lab.Marland KitchenMarland Kitchen

## 2014-01-10 ENCOUNTER — Other Ambulatory Visit: Payer: Medicare Other

## 2014-01-22 ENCOUNTER — Ambulatory Visit (HOSPITAL_BASED_OUTPATIENT_CLINIC_OR_DEPARTMENT_OTHER): Payer: Medicare Other | Admitting: Internal Medicine

## 2014-01-22 ENCOUNTER — Telehealth: Payer: Self-pay | Admitting: Internal Medicine

## 2014-01-22 ENCOUNTER — Other Ambulatory Visit (HOSPITAL_BASED_OUTPATIENT_CLINIC_OR_DEPARTMENT_OTHER): Payer: Medicare Other

## 2014-01-22 VITALS — BP 142/83 | HR 89 | Temp 98.1°F | Resp 19 | Ht 67.0 in | Wt 119.9 lb

## 2014-01-22 DIAGNOSIS — C7952 Secondary malignant neoplasm of bone marrow: Secondary | ICD-10-CM

## 2014-01-22 DIAGNOSIS — N039 Chronic nephritic syndrome with unspecified morphologic changes: Secondary | ICD-10-CM

## 2014-01-22 DIAGNOSIS — C649 Malignant neoplasm of unspecified kidney, except renal pelvis: Secondary | ICD-10-CM

## 2014-01-22 DIAGNOSIS — M8440XA Pathological fracture, unspecified site, initial encounter for fracture: Secondary | ICD-10-CM

## 2014-01-22 DIAGNOSIS — N186 End stage renal disease: Secondary | ICD-10-CM

## 2014-01-22 DIAGNOSIS — D631 Anemia in chronic kidney disease: Secondary | ICD-10-CM

## 2014-01-22 DIAGNOSIS — C7951 Secondary malignant neoplasm of bone: Secondary | ICD-10-CM

## 2014-01-22 DIAGNOSIS — Z992 Dependence on renal dialysis: Secondary | ICD-10-CM

## 2014-01-22 DIAGNOSIS — I10 Essential (primary) hypertension: Secondary | ICD-10-CM

## 2014-01-22 DIAGNOSIS — R630 Anorexia: Secondary | ICD-10-CM

## 2014-01-22 LAB — COMPREHENSIVE METABOLIC PANEL (CC13)
ALT: 46 U/L (ref 0–55)
AST: 49 U/L — ABNORMAL HIGH (ref 5–34)
Albumin: 3.2 g/dL — ABNORMAL LOW (ref 3.5–5.0)
Alkaline Phosphatase: 134 U/L (ref 40–150)
Anion Gap: 17 mEq/L — ABNORMAL HIGH (ref 3–11)
BILIRUBIN TOTAL: 0.45 mg/dL (ref 0.20–1.20)
BUN: 20.9 mg/dL (ref 7.0–26.0)
CALCIUM: 7.6 mg/dL — AB (ref 8.4–10.4)
CO2: 29 mEq/L (ref 22–29)
CREATININE: 5.8 mg/dL — AB (ref 0.7–1.3)
Chloride: 96 mEq/L — ABNORMAL LOW (ref 98–109)
Glucose: 135 mg/dl (ref 70–140)
Potassium: 4.7 mEq/L (ref 3.5–5.1)
Sodium: 143 mEq/L (ref 136–145)
Total Protein: 8.3 g/dL (ref 6.4–8.3)

## 2014-01-22 LAB — CBC WITH DIFFERENTIAL/PLATELET
BASO%: 0.8 % (ref 0.0–2.0)
Basophils Absolute: 0 10*3/uL (ref 0.0–0.1)
EOS%: 1.5 % (ref 0.0–7.0)
Eosinophils Absolute: 0.1 10*3/uL (ref 0.0–0.5)
HEMATOCRIT: 33.5 % — AB (ref 38.4–49.9)
HGB: 11.2 g/dL — ABNORMAL LOW (ref 13.0–17.1)
LYMPH%: 25.7 % (ref 14.0–49.0)
MCH: 30.4 pg (ref 27.2–33.4)
MCHC: 33.4 g/dL (ref 32.0–36.0)
MCV: 91 fL (ref 79.3–98.0)
MONO#: 0.4 10*3/uL (ref 0.1–0.9)
MONO%: 8.2 % (ref 0.0–14.0)
NEUT#: 3.1 10*3/uL (ref 1.5–6.5)
NEUT%: 63.8 % (ref 39.0–75.0)
NRBC: 1 % — AB (ref 0–0)
PLATELETS: 280 10*3/uL (ref 140–400)
RBC: 3.68 10*6/uL — AB (ref 4.20–5.82)
RDW: 17.8 % — ABNORMAL HIGH (ref 11.0–14.6)
WBC: 4.8 10*3/uL (ref 4.0–10.3)
lymph#: 1.2 10*3/uL (ref 0.9–3.3)

## 2014-01-22 LAB — LACTATE DEHYDROGENASE (CC13): LDH: 359 U/L — ABNORMAL HIGH (ref 125–245)

## 2014-01-22 MED ORDER — TRAMADOL HCL 50 MG PO TABS: 50.0000 mg | ORAL_TABLET | Freq: Four times a day (QID) | ORAL | Status: DC | PRN

## 2014-01-22 NOTE — Telephone Encounter (Signed)
Gave pt appt for lab and Md for May and June 2015, gave pt oral contrast

## 2014-01-22 NOTE — Progress Notes (Signed)
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 Follow-up    CURRENT THERAPY: Sutent 50 mg daily on days 1-28, off on 29-42. 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  Seen 2 weeks before by PCP/Orthopedics with X-ray of hip and CXR revealing lytic sxpansile lesion with pathologic fracture, left inferior lesion.  Onc (Dr. Juliann Mule) consulted on 10/29; Rad onc (Dr. Lisbeth Renshaw) on 11/01   07/31/2013 Imaging CT C/A/P hyperemic 2.3 cm R paratracheal NL, smaller enlarged precarinal, subcarinal, pretracheal, adn prevascular LN, lesion in the proximal R 9th rib, smaller lytic lesion with path fracture of lateral R 6 th rib; 4.5 cm L kidney mass; 3 cm R kid mass   07/31/2013 Imaging MRI,  L hip on admission revealed a 6x6x5 cm homogeneous destructive soft tissue mass destroying the anterior medial and anterior superior aspects of the left acetabulum and lateral aspect of the left surperior pubic ramus.  2 cm mass in the post L5.   08/02/2013 Procedure CT Guide biopsy of left pelci soft tissue mass.    08/05/2013 Pathology Metastatic renal cell carcinoma, clear cell type.     08/05/2013 Cancer Staging Stage IV, RCC with metastases to the bone.   08/08/2013 - 08/21/2013 Radiation Therapy Treated to 3 distinct areas corresponding to the lumbar spine from approximately L1-L5, the left hip/pelvis, in the proximal right femur. Each of these separate target areas were treated with 2 fields each. The target areas each received 30 gray in 10 fra   08/24/2013 - 10/17/2013 Chemotherapy Started Sutent 50 mg on Days 1-28, off days 29-42.     10/17/2013 Adverse Reaction  Grade 4 elevated Transiminases with normal Bilirubin.  Sutent held until resolution.   11/17/2013 Imaging Re-staging CT C/A/P:Apparent interval response to therapy with decrease in mediastinal LAD and a lesion in the posterior right iliac bone.Other bony metastases and bilateral heterogeneously enhancing renallesions are not subtantial changed.   11/22/2013 -  Chemotherapy Sutent 50 mg on Days 1-28, off days 29-42 is restarted.   Liver enzymes normal. Referral to hepatology if increases again.  Consider afinitor as an alternative treatment.     INTERVAL HISTORY: [name] 63 y.o. male with a history of Stage IV ccRCC with mets to bone is here for follow-up. He was last seen by me on 12/20/2013.  Today, he is accompanied by his sister.   Yesterday was his last day taking the medicine.  He will be off for the next 2 weeks. He reports slight improvement in pain to his right hip.    He denies any fevers or chills or acute shortness of breath.  He denies any yellowing of his eyes.   He denies abdominal pain, nausea/vomiting, diarrhea or rashes.   MEDICAL HISTORY: Past Medical History  Diagnosis Date  . Renal disorder   . Hypertension   . Dialysis patient   . Secondary hyperparathyroidism (of renal origin)     s/p parathyroidectomy  . Anemia due to chronic disease treated with erythropoietin   . Closed fracture of pubic ramus 08/2013    pathologic (metastatic renal cell ca)  . History of radiation therapy 08/08/13-08/21/13  L-1-L5, ,lt hip/pelvis,prox rt femur  . 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    ALLERGIES:  has No Known Allergies.  MEDICATIONS: has a current medication list which includes the following prescription(s): albuterol, alprazolam, calcium carbonate, cyclobenzaprine, dss, doxepin, ferrous sulfate, hydrocodone-acetaminophen, losartan, multivitamin, prochlorperazine, promethazine, ranitidine, eucerin, and tramadol.  SURGICAL HISTORY:  Past Surgical History  Procedure Laterality Date  . Av fistula placement      REVIEW OF SYSTEMS:   Constitutional: Denies fevers, chills or abnormal weight loss; he reports a diminished appeitite.   Eyes: Denies blurriness of vision Ears, nose, mouth, throat, and face: Denies mucositis or sore throat Respiratory: Denies cough, dyspnea or wheezes Cardiovascular: Denies palpitation, chest discomfort or lower extremity swelling Gastrointestinal:  Denies nausea, heartburn or change in bowel habits Skin: Denies abnormal skin rashes Lymphatics: Denies new lymphadenopathy or easy bruising Neurological:Denies numbness, tingling or new weaknesses Behavioral/Psych: Mood is stable, no new changes  All other systems were reviewed with the patient and are negative.  PHYSICAL EXAMINATION: ECOG PERFORMANCE STATUS: 2 - Symptomatic, <50% confined to bed  Blood pressure 142/83, pulse 89, temperature 98.1 F (36.7 C), resp. rate 19, height 5\' 7"  (1.702 m), weight 119 lb 14.4 oz (54.386 kg).  GENERAL:alert, no distress and comfortable; chronically ill appearing thin male with dry skin.  SKIN: skin color, texture, turgor are normal, no rashes or significant lesions EYES: normal, Conjunctiva are pink and non-injected, sclera clear OROPHARYNX:no exudate, no erythema and lips, buccal mucosa, and tongue normal; Poor dentition.  NECK: supple, thyroid normal size, non-tender, without nodularity LYMPH:  no palpable lymphadenopathy in the cervical, axillary or supraclavicular LUNGS: clear to auscultation with normal  breathing effort, no wheezes or rhonchi HEART: regular rate & rhythm and no murmurs and no lower extremity edema ABDOMEN:abdomen soft, non-tender and normal bowel sounds Musculoskeletal:no cyanosis of digits and no clubbing; R AV fistula.  NEURO: alert & oriented x 3 with fluent speech, no focal motor/sensory deficits  Labs:  Lab Results  Component Value Date   WBC 4.8 01/22/2014   HGB 11.2* 01/22/2014   HCT 33.5* 01/22/2014   MCV 91.0 01/22/2014   PLT 280 01/22/2014   NEUTROABS 3.1 01/22/2014      Chemistry      Component Value Date/Time   NA 143 01/22/2014 1100   NA 136 08/06/2013 1045   NA 136 08/06/2013 1045   K 4.7 01/22/2014 1100   K 4.7 08/06/2013 1045   K 4.8 08/06/2013 1045   CL 93* 08/06/2013 1045   CL 93* 08/06/2013 1045   CO2 29 01/22/2014 1100   CO2 25 08/06/2013 1045   CO2 25 08/06/2013 1045   BUN 20.9 01/22/2014 1100   BUN 56* 08/06/2013 1045   BUN 57* 08/06/2013 1045   CREATININE 5.8* 01/22/2014 1100   CREATININE 8.52* 08/06/2013 1045   CREATININE 8.61* 08/06/2013 1045      Component Value Date/Time   CALCIUM 7.6* 01/22/2014 1100   CALCIUM 9.2 08/06/2013 1045   CALCIUM 9.2 08/06/2013 1045   ALKPHOS 134 01/22/2014 1100   ALKPHOS 84 12/20/2013 1034   AST 49*  01/22/2014 1100   AST 30 12/20/2013 1034   ALT 46 01/22/2014 1100   ALT 26 12/20/2013 1034   BILITOT 0.45 01/22/2014 1100   BILITOT 0.6 12/20/2013 1034       Basic Metabolic Panel:  Recent Labs Lab 01/22/14 1100  NA 143  K 4.7  CO2 29  GLUCOSE 135  BUN 20.9  CREATININE 5.8*  CALCIUM 7.6*   GFR Estimated Creatinine Clearance: 10.3 ml/min (by C-G formula based on Cr of 5.8). Liver Function Tests:  Recent Labs Lab 01/22/14 1100  AST 49*  ALT 46  ALKPHOS 134  BILITOT 0.45  PROT 8.3  ALBUMIN 3.2*    Studies:  No results found.   RADIOGRAPHIC STUDIES: None ASSESSMENT: [name] 63 y.o. 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  His liver enzymes have now normalized with mild elevation in AST of 49.  We will continue his sutent cautiously given his response to therapy as noted by recent CT C/A/P.  Tomorrow, he will start his two-week off period.  --Adjunctive bone therapy. He is unlikely to be a candidate for bisphosphonate therapy given ESRDz and/or poor dentition. He is scheduled to see dentistry.  --We will re-stage in 2 cycles.  Next therapy likely afinitor if perssistent hepatotoxicity.  We will scan prior to next visit.   2. Hepatotoxicity secondary to Sutent, resolved.  --History of hepatitis C.  --He was counseled extensively to avoid tylenol and other acetaminophen containing products in the setting of his elevated liver enzymes.   3. ESRDz on H/D.  --Continue hemodialysis per nephrology with epo.   4. Hypertension.  --Continue amlodipine 10 mg, losartan 25 mg daily.   5. Anemia in chronic kidney disease +/- IDA  -- Continue epo and RBC transfusion prn symptoms.   6. Pathological fracture of Left hip secondary to #1.  --Completed XRT on 08/21/2013. Continue pain control with oxycodone 5 mg q 6 hours (two tabs q 4 hours prn moderate pain) and tramadol 50 q 6 hours prn pain. May add long-acting if this is not enough.  Pain medications provided today.   7. Anorexia and decreased appetite. --Referral to nutrition made. He is supplementing his diet with ensure/boost.   8. Follow-up.  --He will follow up for labs and CT C/A/P in 5 weeks. He will follow up for a symptom check in 6 weeks.   All questions were answered. The patient knows to call the clinic with any problems, questions or concerns. We can certainly see the patient much sooner if necessary.  I spent 15 minutes counseling the patient face to face. The total time  spent in the appointment was 25 minutes.    Concha Norway, MD 01/22/2014 12:04 PM

## 2014-01-29 ENCOUNTER — Ambulatory Visit: Payer: Medicare Other | Admitting: Nurse Practitioner

## 2014-02-20 ENCOUNTER — Other Ambulatory Visit: Payer: Self-pay | Admitting: Internal Medicine

## 2014-02-28 ENCOUNTER — Other Ambulatory Visit (HOSPITAL_BASED_OUTPATIENT_CLINIC_OR_DEPARTMENT_OTHER): Payer: Medicare Other

## 2014-02-28 ENCOUNTER — Ambulatory Visit (HOSPITAL_COMMUNITY)
Admission: RE | Admit: 2014-02-28 | Discharge: 2014-02-28 | Disposition: A | Discharge status: Home / Self Care | Payer: Medicare Other | Source: Ambulatory Visit | Attending: Internal Medicine | Admitting: Internal Medicine

## 2014-02-28 DIAGNOSIS — C649 Malignant neoplasm of unspecified kidney, except renal pelvis: Secondary | ICD-10-CM

## 2014-02-28 DIAGNOSIS — M949 Disorder of cartilage, unspecified: Secondary | ICD-10-CM

## 2014-02-28 DIAGNOSIS — C7951 Secondary malignant neoplasm of bone: Secondary | ICD-10-CM

## 2014-02-28 DIAGNOSIS — C7952 Secondary malignant neoplasm of bone marrow: Secondary | ICD-10-CM

## 2014-02-28 DIAGNOSIS — M25559 Pain in unspecified hip: Secondary | ICD-10-CM | POA: Insufficient documentation

## 2014-02-28 DIAGNOSIS — Z992 Dependence on renal dialysis: Secondary | ICD-10-CM | POA: Insufficient documentation

## 2014-02-28 DIAGNOSIS — M8440XA Pathological fracture, unspecified site, initial encounter for fracture: Secondary | ICD-10-CM

## 2014-02-28 DIAGNOSIS — N400 Enlarged prostate without lower urinary tract symptoms: Secondary | ICD-10-CM | POA: Insufficient documentation

## 2014-02-28 DIAGNOSIS — M8448XA Pathological fracture, other site, initial encounter for fracture: Secondary | ICD-10-CM | POA: Insufficient documentation

## 2014-02-28 DIAGNOSIS — N281 Cyst of kidney, acquired: Secondary | ICD-10-CM | POA: Insufficient documentation

## 2014-02-28 DIAGNOSIS — N186 End stage renal disease: Secondary | ICD-10-CM

## 2014-02-28 DIAGNOSIS — N289 Disorder of kidney and ureter, unspecified: Secondary | ICD-10-CM | POA: Insufficient documentation

## 2014-02-28 DIAGNOSIS — M549 Dorsalgia, unspecified: Secondary | ICD-10-CM | POA: Insufficient documentation

## 2014-02-28 DIAGNOSIS — M899 Disorder of bone, unspecified: Secondary | ICD-10-CM | POA: Insufficient documentation

## 2014-02-28 DIAGNOSIS — J438 Other emphysema: Secondary | ICD-10-CM | POA: Insufficient documentation

## 2014-02-28 DIAGNOSIS — R599 Enlarged lymph nodes, unspecified: Secondary | ICD-10-CM | POA: Insufficient documentation

## 2014-02-28 DIAGNOSIS — Z9221 Personal history of antineoplastic chemotherapy: Secondary | ICD-10-CM | POA: Insufficient documentation

## 2014-02-28 DIAGNOSIS — Z4789 Encounter for other orthopedic aftercare: Secondary | ICD-10-CM | POA: Insufficient documentation

## 2014-02-28 LAB — CBC WITH DIFFERENTIAL/PLATELET
BASO%: 0.2 % (ref 0.0–2.0)
BASOS ABS: 0 10*3/uL (ref 0.0–0.1)
EOS ABS: 0.3 10*3/uL (ref 0.0–0.5)
EOS%: 4.6 % (ref 0.0–7.0)
HCT: 40.8 % (ref 38.4–49.9)
HEMOGLOBIN: 13.2 g/dL (ref 13.0–17.1)
LYMPH%: 34.5 % (ref 14.0–49.0)
MCH: 29.9 pg (ref 27.2–33.4)
MCHC: 32.4 g/dL (ref 32.0–36.0)
MCV: 92.4 fL (ref 79.3–98.0)
MONO#: 0.6 10*3/uL (ref 0.1–0.9)
MONO%: 11.1 % (ref 0.0–14.0)
NEUT%: 49.6 % (ref 39.0–75.0)
NEUTROS ABS: 2.9 10*3/uL (ref 1.5–6.5)
Platelets: 260 10*3/uL (ref 140–400)
RBC: 4.41 10*6/uL (ref 4.20–5.82)
RDW: 16.5 % — ABNORMAL HIGH (ref 11.0–14.6)
WBC: 5.8 10*3/uL (ref 4.0–10.3)
lymph#: 2 10*3/uL (ref 0.9–3.3)

## 2014-02-28 LAB — COMPREHENSIVE METABOLIC PANEL (CC13)
ALBUMIN: 3.5 g/dL (ref 3.5–5.0)
ALT: 32 U/L (ref 0–55)
ANION GAP: 17 meq/L — AB (ref 3–11)
AST: 31 U/L (ref 5–34)
Alkaline Phosphatase: 119 U/L (ref 40–150)
BUN: 48.1 mg/dL — AB (ref 7.0–26.0)
CO2: 26 meq/L (ref 22–29)
Calcium: 8 mg/dL — ABNORMAL LOW (ref 8.4–10.4)
Chloride: 98 mEq/L (ref 98–109)
Creatinine: 6.7 mg/dL (ref 0.7–1.3)
GLUCOSE: 77 mg/dL (ref 70–140)
POTASSIUM: 5.9 meq/L — AB (ref 3.5–5.1)
SODIUM: 141 meq/L (ref 136–145)
Total Bilirubin: 0.5 mg/dL (ref 0.20–1.20)
Total Protein: 8.4 g/dL — ABNORMAL HIGH (ref 6.4–8.3)

## 2014-02-28 LAB — LACTATE DEHYDROGENASE (CC13): LDH: 270 U/L — AB (ref 125–245)

## 2014-02-28 MED ORDER — IOHEXOL 300 MG/ML  SOLN
100.0000 mL | Freq: Once | INTRAMUSCULAR | Status: AC | PRN
  Administered 2014-02-28: 100 mL via INTRAVENOUS

## 2014-03-01 ENCOUNTER — Other Ambulatory Visit: Payer: Self-pay | Admitting: Internal Medicine

## 2014-03-01 DIAGNOSIS — E875 Hyperkalemia: Secondary | ICD-10-CM

## 2014-03-01 MED ORDER — SODIUM POLYSTYRENE SULFONATE 15 GM/60ML PO SUSP: 30.0000 g | Freq: Once | ORAL | Status: DC

## 2014-03-03 ENCOUNTER — Other Ambulatory Visit: Payer: Self-pay | Admitting: Internal Medicine

## 2014-03-03 DIAGNOSIS — C7951 Secondary malignant neoplasm of bone: Principal | ICD-10-CM

## 2014-03-03 DIAGNOSIS — C649 Malignant neoplasm of unspecified kidney, except renal pelvis: Secondary | ICD-10-CM

## 2014-03-07 ENCOUNTER — Telehealth: Payer: Self-pay | Admitting: Internal Medicine

## 2014-03-07 ENCOUNTER — Ambulatory Visit (HOSPITAL_BASED_OUTPATIENT_CLINIC_OR_DEPARTMENT_OTHER): Payer: Medicare Other | Admitting: Internal Medicine

## 2014-03-07 VITALS — BP 128/71 | HR 75 | Temp 98.1°F | Resp 18 | Ht 67.0 in | Wt 123.2 lb

## 2014-03-07 DIAGNOSIS — C649 Malignant neoplasm of unspecified kidney, except renal pelvis: Secondary | ICD-10-CM

## 2014-03-07 DIAGNOSIS — C7951 Secondary malignant neoplasm of bone: Secondary | ICD-10-CM

## 2014-03-07 DIAGNOSIS — E875 Hyperkalemia: Secondary | ICD-10-CM

## 2014-03-07 DIAGNOSIS — C7952 Secondary malignant neoplasm of bone marrow: Secondary | ICD-10-CM

## 2014-03-07 NOTE — Telephone Encounter (Signed)
gv pt appt schedule for july °

## 2014-03-07 NOTE — Progress Notes (Signed)
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 Follow-up    CURRENT THERAPY: Sutent 50 mg daily on days 1-28, off on 29-42. 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  Seen 2 weeks before by PCP/Orthopedics with X-ray of hip and CXR revealing lytic sxpansile lesion with pathologic fracture, left inferior lesion.  Onc (Dr. Juliann Mule) consulted on 10/29; Rad onc (Dr. Lisbeth Renshaw) on 11/01   07/31/2013 Imaging CT C/A/P hyperemic 2.3 cm R paratracheal NL, smaller enlarged precarinal, subcarinal, pretracheal, adn prevascular LN, lesion in the proximal R 9th rib, smaller lytic lesion with path fracture of lateral R 6 th rib; 4.5 cm L kidney mass; 3 cm R kid mass   07/31/2013 Imaging MRI,  L hip on admission revealed a 6x6x5 cm homogeneous destructive soft tissue mass destroying the anterior medial and anterior superior aspects of the left acetabulum and lateral aspect of the left surperior pubic ramus.  2 cm mass in the post L5.   08/02/2013 Procedure CT Guide biopsy of left pelci soft tissue mass.    08/05/2013 Pathology Metastatic renal cell carcinoma, clear cell type.     08/05/2013 Cancer Staging Stage IV, RCC with metastases to the bone.   08/08/2013 - 08/21/2013 Radiation Therapy Treated to 3 distinct areas corresponding to the lumbar spine from approximately L1-L5, the left hip/pelvis, in the proximal right femur. Each of these separate target areas were treated with 2 fields each. The target areas each received 30 gray in 10 fra   08/24/2013 - 10/17/2013 Chemotherapy Started Sutent 50 mg on Days  1-28, off days 29-42.     10/17/2013 Adverse Reaction Grade 4 elevated Transiminases with normal Bilirubin.  Sutent held until resolution.   11/17/2013 Imaging Re-staging CT C/A/P:Apparent interval response to therapy with decrease in mediastinal LAD and a lesion in the posterior right iliac bone.Other bony metastases and bilateral heterogeneously enhancing renallesions are not subtantial changed.   11/22/2013 -  Chemotherapy Sutent 50 mg on Days 1-28, off days 29-42 is restarted.   Liver enzymes normal. Referral to hepatology if increases again.  Consider afinitor as an alternative treatment.     INTERVAL HISTORY: [name] 63 y.o. male with a history of Stage IV ccRCC with mets to bone is here for follow-up. He was last seen by me on 01/22/2014.  Today, he is accompanied by his sister.   He will start his 2 weeks off on 06/07.    He reports slight improvement in pain to his right hip.    He denies any fevers or chills or acute shortness of breath.  He denies any yellowing of his eyes.   He denies abdominal pain, nausea/vomiting, diarrhea or rashes.   MEDICAL HISTORY: Past Medical History  Diagnosis Date  . Renal disorder   . Hypertension   . Dialysis patient   . Secondary hyperparathyroidism (of renal origin)     s/p parathyroidectomy  . Anemia due to chronic disease treated with erythropoietin   . Closed fracture of pubic ramus 08/2013    pathologic (metastatic renal cell ca)  . History of radiation therapy 08/08/13-08/21/13  L-1-L5, ,lt hip/pelvis,prox rt femur  . 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    ALLERGIES:  has No Known Allergies.  MEDICATIONS: has a current medication list which includes the following prescription(s): albuterol, alprazolam, calcium carbonate, cyclobenzaprine, dss, doxepin, ferrous sulfate, hydrocodone-acetaminophen, losartan, multivitamin, prochlorperazine, promethazine, ranitidine, eucerin, sodium polystyrene, sutent, and tramadol.  SURGICAL HISTORY:  Past Surgical History  Procedure Laterality Date  . Av fistula placement      REVIEW OF SYSTEMS:   Constitutional: Denies fevers, chills or abnormal weight loss; he reports a diminished appeitite.   Eyes: Denies blurriness of vision Ears, nose, mouth, throat, and face: Denies mucositis or sore throat Respiratory: Denies cough, dyspnea or wheezes Cardiovascular: Denies palpitation, chest discomfort or lower extremity swelling Gastrointestinal:  Denies nausea, heartburn or change in bowel habits Skin: Denies abnormal skin rashes Lymphatics: Denies new lymphadenopathy or easy bruising Neurological:Denies numbness, tingling or new weaknesses Behavioral/Psych: Mood is stable, no new changes  All other systems were reviewed with the patient and are negative.  PHYSICAL EXAMINATION: ECOG PERFORMANCE STATUS: 2 - Symptomatic, <50% confined to bed  Blood pressure 128/71, pulse 75, temperature 98.1 F (36.7 C), temperature source Oral, resp. rate 18, height 5\' 7"  (1.702 m), weight 123 lb 3.2 oz (55.883 kg).  GENERAL:alert, no distress and comfortable; chronically ill appearing thin male with dry skin. alopecia SKIN: skin color, texture, turgor are normal, no rashes or significant lesions EYES: normal, Conjunctiva are pink and non-injected, sclera clear OROPHARYNX:no exudate, no erythema and lips, buccal mucosa, and tongue normal; Poor dentition.  NECK: supple, thyroid normal size, non-tender, without nodularity LYMPH:  no palpable  lymphadenopathy in the cervical, axillary or supraclavicular LUNGS: clear to auscultation with normal breathing effort, no wheezes or rhonchi HEART: regular rate & rhythm and no murmurs and no lower extremity edema ABDOMEN:abdomen soft, non-tender and normal bowel sounds Musculoskeletal:no cyanosis of digits and no clubbing; R AV fistula.  NEURO: alert & oriented x 3 with fluent speech, no focal motor/sensory deficits  Labs:  Lab Results  Component Value Date   WBC 5.8 02/28/2014   HGB 13.2 02/28/2014   HCT 40.8 02/28/2014   MCV 92.4 02/28/2014   PLT 260 02/28/2014   NEUTROABS 2.9 02/28/2014      Chemistry      Component Value Date/Time   NA 141 02/28/2014 1157   NA 136 08/06/2013 1045   NA 136 08/06/2013 1045   K 5.9* 02/28/2014 1157   K 4.7 08/06/2013 1045   K 4.8 08/06/2013 1045   CL 93* 08/06/2013 1045   CL 93* 08/06/2013 1045   CO2 26 02/28/2014 1157   CO2 25 08/06/2013 1045   CO2 25 08/06/2013 1045   BUN 48.1* 02/28/2014 1157   BUN 56* 08/06/2013 1045   BUN 57* 08/06/2013 1045   CREATININE 6.7* 02/28/2014 1157   CREATININE 8.52* 08/06/2013 1045   CREATININE 8.61* 08/06/2013 1045      Component Value Date/Time   CALCIUM 8.0* 02/28/2014 1157   CALCIUM 9.2 08/06/2013 1045   CALCIUM 9.2 08/06/2013 1045   ALKPHOS 119 02/28/2014 1157   ALKPHOS  84 12/20/2013 1034   AST 31 02/28/2014 1157   AST 30 12/20/2013 1034   ALT 32 02/28/2014 1157   ALT 26 12/20/2013 1034   BILITOT 0.50 02/28/2014 1157   BILITOT 0.6 12/20/2013 1034       Basic Metabolic Panel: No results found for this basename: NA, K, CL, CO2, GLUCOSE, BUN, CREATININE, CALCIUM, MG, PHOS,  in the last 168 hours GFR Estimated Creatinine Clearance: 9.2 ml/min (by C-G formula based on Cr of 6.7). Liver Function Tests: No results found for this basename: AST, ALT, ALKPHOS, BILITOT, PROT, ALBUMIN,  in the last 168 hours  Studies:  No results found.   RADIOGRAPHIC STUDIES: 02/28/2014  CT CHEST, ABDOMEN, AND PELVIS WITH CONTRAST   TECHNIQUE:  Multidetector CT imaging of the chest, abdomen and pelvis was  performed following the standard protocol during bolus  administration of intravenous contrast.  CONTRAST: 136mL OMNIPAQUE IOHEXOL 300 MG/ML SOLN  COMPARISON: 11/20/2013  FINDINGS:  CT CHEST FINDINGS  Moderate paraseptal emphysematous changes. No suspicious pulmonary  nodules. No pleural effusion or pneumothorax.  Visualized thyroid is unremarkable.  The heart is normal in size. No pericardial effusion.  Small mediastinal nodes, similar, including:  --9 mm short axis her paratracheal node (series 6/ image 23),  unchanged  --15 mm short axis right paratracheal node (series 6/ image 34),  previously 16 mm  --6 mm short axis right azygoesophageal recess node (series 6/image  50), previously 8 mm  2.2 x 3.4 cm soft tissue lesion involving the right posterior medial  9th rib (series 6/image 62), previously 2.1 x 3.5 cm, with  associated pathologic fracture. Healing fracture involving the right  posterolateral 6th rib (series 6/image 42). Multiple old/healed left  posterior rib fractures.  CT ABDOMEN AND PELVIS FINDINGS  Liver, spleen, pancreas, and adrenal glands are within normal  limits.  Status post cholecystectomy. No intrahepatic or extrahepatic ductal  dilatation.  Innumerable bilateral renal cysts of varying sizes and complexities.  Three enhancing renal lesions, as follows:  --3.2 x 2.8 cm enhancing mass is in the lateral right upper kidney  (series 6/image 99), previously 3.2 x 2.8 cm, unchanged  --9 x 11 mm enhancing lesion in the lateral left upper kidney  (series 6/image 101), previously 14 x 12 mm, mildly decreased  --2.2 x 3.6 cm lesion in the anterior interpolar left kidney (series  6/image 113), previously 3.9 x 4.5 cm, decreased  No evidence of bowel obstruction.  Atherosclerotic calcifications of the abdominal aorta and branch  vessels.  No abdominopelvic ascites.  No suspicious  abdominopelvic lymphadenopathy.  Prostatomegaly, which enlargement of the central gland which indents  the base of the bladder.  Bladder is mildly thick-walled although underdistended.  Destructive lesion with pathologic fracture involving the left  superior pubic ramus measures approximately 2.1 x 3.6 cm (series 6/  image 24), previously 3.5 x 3.9 cm. Tumor involves the  anterior/superior acetabulum with associated abnormal soft tissue  along the left hip joint (series 6/image 23) stable versus minimally  decreased.  1.7 x 2.1 cm lytic lesion extending to the epidural space along the  posterior aspect of the L5 vertebral body (series 6/image 144),  previously 2.0 x 2.1 cm. Additional dominant destructive lesion at  L2 with perispinal tumor along the left aspect of L3 (series 6/  image 120), similar.  2.4 x 2.0 cm soft tissue lesion along the posterior aspect of the  right iliac bone (series 6/ image 148), previously 1.9 x 2.4 cm.  Healing  fractures involving the left inferior pubic ramus (series 6/  images 195 and 199).  IMPRESSION:  Three enhancing renal lesions, compatible with solid renal neoplasms  as described above, mildly decreased.  Multifocal destructive osseous metastases, as described above,  mildly decreased.  Mild mediastinal lymphadenopathy, grossly unchanged.  Pathologic fractures involving the right posterior 9th rib and left  superior pubic ramus. Additional healing fractures, as above.    ASSESSMENT: [name] 63 y.o. 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  His liver enzymes have now normalized.  We will continue his sutent given his  response to therapy as  noted by recent CT C/A/P.  In 2 days, he will start his two-week off period.  --Adjunctive bone therapy. He is unlikely to be a candidate for bisphosphonate therapy given ESRDz and/or poor dentition. He is scheduled to see dentistry.  --We will re-stage in 2 cycles.  Next therapy likely afinitor if perssistent hepatotoxicity.    2. Hepatotoxicity secondary to Sutent, resolved.  --History of hepatitis C.  --He was counseled extensively to avoid tylenol and other acetaminophen containing products in the setting of his elevated liver enzymes.   3. ESRDz on H/D.  --Continue hemodialysis per nephrology with epo.   4. Hypertension.  --Continue amlodipine 10 mg, losartan 25 mg daily.   5. Anemia in chronic kidney disease +/- IDA, resolved  -- Continue epo and RBC transfusion prn symptoms.   6. Pathological fracture of Left hip secondary to #1.  --Completed XRT on 08/21/2013. Continue pain control with oxycodone 5 mg q 6 hours (two tabs q 4 hours prn moderate pain) and tramadol 50 q 6 hours prn pain. May add long-acting if this is not enough.  Pain medications provided today.   7. Anorexia and decreased appetite. --He is supplementing his diet with ensure/boost.   8. Hyperkalemia. --He had labs repeated yesterday in dialysis with a potassium of 4.2.   9. Follow-up.  --He will follow up for a symptom check in 6 weeks and labs including a CBC and chemistries.   All questions were answered. The patient knows to call the clinic with any problems, questions or concerns. We can certainly see the patient much sooner if necessary.  I spent 15 minutes counseling the patient face to face. The total time spent in the appointment was 25 minutes.    Concha Norway, MD 03/07/2014 12:36 PM

## 2014-03-19 ENCOUNTER — Other Ambulatory Visit: Payer: Self-pay | Admitting: Internal Medicine

## 2014-03-20 ENCOUNTER — Other Ambulatory Visit: Payer: Self-pay | Admitting: Medical Oncology

## 2014-03-20 MED ORDER — SUNITINIB MALATE 50 MG PO CAPS: 50.0000 mg | ORAL_CAPSULE | Freq: Every day | ORAL | Status: DC

## 2014-03-20 MED ORDER — SUNITINIB MALATE 50 MG PO CAPS: ORAL_CAPSULE | ORAL | Status: DC

## 2014-03-20 NOTE — Telephone Encounter (Signed)
Call from [facility] that they are unable to dispense this medicine, needs specialty pharmacy to dispense. Will fax to another pharmacy and verify coverage.

## 2014-04-08 ENCOUNTER — Other Ambulatory Visit: Payer: Self-pay | Admitting: Internal Medicine

## 2014-04-18 ENCOUNTER — Encounter: Payer: Self-pay | Admitting: Internal Medicine

## 2014-04-18 ENCOUNTER — Ambulatory Visit (HOSPITAL_BASED_OUTPATIENT_CLINIC_OR_DEPARTMENT_OTHER): Payer: Medicare Other | Admitting: Internal Medicine

## 2014-04-18 ENCOUNTER — Telehealth: Payer: Self-pay | Admitting: Internal Medicine

## 2014-04-18 ENCOUNTER — Other Ambulatory Visit (HOSPITAL_BASED_OUTPATIENT_CLINIC_OR_DEPARTMENT_OTHER): Payer: Medicare Other

## 2014-04-18 VITALS — BP 124/71 | HR 100 | Temp 97.9°F | Resp 18 | Ht 67.0 in | Wt 123.2 lb

## 2014-04-18 DIAGNOSIS — C649 Malignant neoplasm of unspecified kidney, except renal pelvis: Secondary | ICD-10-CM

## 2014-04-18 DIAGNOSIS — Z992 Dependence on renal dialysis: Secondary | ICD-10-CM

## 2014-04-18 DIAGNOSIS — C7951 Secondary malignant neoplasm of bone: Principal | ICD-10-CM

## 2014-04-18 DIAGNOSIS — C7952 Secondary malignant neoplasm of bone marrow: Secondary | ICD-10-CM

## 2014-04-18 DIAGNOSIS — N186 End stage renal disease: Secondary | ICD-10-CM

## 2014-04-18 DIAGNOSIS — R630 Anorexia: Secondary | ICD-10-CM

## 2014-04-18 DIAGNOSIS — M84453A Pathological fracture, unspecified femur, initial encounter for fracture: Secondary | ICD-10-CM

## 2014-04-18 DIAGNOSIS — M8440XP Pathological fracture, unspecified site, subsequent encounter for fracture with malunion: Secondary | ICD-10-CM

## 2014-04-18 LAB — CBC WITH DIFFERENTIAL/PLATELET
BASO%: 1.2 % (ref 0.0–2.0)
BASOS ABS: 0.1 10*3/uL (ref 0.0–0.1)
EOS ABS: 0.2 10*3/uL (ref 0.0–0.5)
EOS%: 2.8 % (ref 0.0–7.0)
HEMATOCRIT: 36.1 % — AB (ref 38.4–49.9)
HEMOGLOBIN: 11.6 g/dL — AB (ref 13.0–17.1)
LYMPH%: 20 % (ref 14.0–49.0)
MCH: 28.8 pg (ref 27.2–33.4)
MCHC: 32 g/dL (ref 32.0–36.0)
MCV: 90.1 fL (ref 79.3–98.0)
MONO#: 0.4 10*3/uL (ref 0.1–0.9)
MONO%: 7.3 % (ref 0.0–14.0)
NEUT%: 68.7 % (ref 39.0–75.0)
NEUTROS ABS: 4.1 10*3/uL (ref 1.5–6.5)
PLATELETS: 381 10*3/uL (ref 140–400)
RBC: 4.01 10*6/uL — ABNORMAL LOW (ref 4.20–5.82)
RDW: 18 % — ABNORMAL HIGH (ref 11.0–14.6)
WBC: 5.9 10*3/uL (ref 4.0–10.3)
lymph#: 1.2 10*3/uL (ref 0.9–3.3)

## 2014-04-18 LAB — COMPREHENSIVE METABOLIC PANEL (CC13)
ALT: 7 U/L (ref 0–55)
ANION GAP: 14 meq/L — AB (ref 3–11)
AST: 35 U/L — ABNORMAL HIGH (ref 5–34)
Albumin: 2.7 g/dL — ABNORMAL LOW (ref 3.5–5.0)
Alkaline Phosphatase: 99 U/L (ref 40–150)
BUN: 29.6 mg/dL — AB (ref 7.0–26.0)
CALCIUM: 8 mg/dL — AB (ref 8.4–10.4)
CO2: 30 mEq/L — ABNORMAL HIGH (ref 22–29)
Chloride: 100 mEq/L (ref 98–109)
Creatinine: 5.3 mg/dL (ref 0.7–1.3)
GLUCOSE: 111 mg/dL (ref 70–140)
Potassium: 4.2 mEq/L (ref 3.5–5.1)
Sodium: 143 mEq/L (ref 136–145)
Total Bilirubin: 0.45 mg/dL (ref 0.20–1.20)
Total Protein: 8.3 g/dL (ref 6.4–8.3)

## 2014-04-18 LAB — LACTATE DEHYDROGENASE (CC13): LDH: 285 U/L — ABNORMAL HIGH (ref 125–245)

## 2014-04-18 MED ORDER — TRAMADOL HCL 50 MG PO TABS: 50.0000 mg | ORAL_TABLET | Freq: Four times a day (QID) | ORAL | Status: DC | PRN

## 2014-04-18 MED ORDER — HYDROCODONE-ACETAMINOPHEN 5-325 MG PO TABS: 1.0000 | ORAL_TABLET | Freq: Two times a day (BID) | ORAL | Status: DC | PRN

## 2014-04-18 NOTE — Telephone Encounter (Signed)
Pt confirmed labs/ov per 07/17 POF, gave pt AVS and liquid for CT @[facility] ....Marland KitchenMarland KitchenKJ

## 2014-04-18 NOTE — Progress Notes (Signed)
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  Seen 2 weeks before by PCP/Orthopedics with X-ray of hip and CXR revealing lytic sxpansile lesion with pathologic fracture, left inferior lesion.  Onc (Dr. Juliann Mule) consulted on 10/29; Rad onc (Dr. Lisbeth Renshaw) on 11/01   07/31/2013 Imaging CT C/A/P hyperemic 2.3 cm R paratracheal NL, smaller enlarged precarinal, subcarinal, pretracheal, adn prevascular LN, lesion in the proximal R 9th rib, smaller lytic lesion with path fracture of lateral R 6 th rib; 4.5 cm L kidney mass; 3 cm R kid mass   07/31/2013 Imaging MRI,  L hip on admission revealed a 6x6x5 cm homogeneous destructive soft tissue mass destroying the anterior medial and anterior superior aspects of the left acetabulum and lateral aspect of the left surperior pubic ramus.  2 cm mass in the post L5.   08/02/2013 Procedure CT Guide biopsy of left pelci  soft tissue mass.    08/05/2013 Pathology Metastatic renal cell carcinoma, clear cell type.     08/05/2013 Cancer Staging Stage IV, RCC with metastases to the bone.   08/08/2013 - 08/21/2013 Radiation Therapy Treated to 3 distinct areas corresponding to the lumbar spine from approximately L1-L5, the left hip/pelvis, in the proximal right femur. Each of these separate target areas were treated with 2 fields each. The target areas each received 30 gray in 10 fra   08/24/2013 - 10/17/2013 Chemotherapy Started Sutent 50 mg on Days 1-28, off days 29-42.     10/17/2013 Adverse Reaction Grade 4 elevated Transiminases with normal Bilirubin.  Sutent held until resolution.   11/17/2013 Imaging Re-staging CT C/A/P:Apparent interval response to therapy with decrease in mediastinal LAD and a lesion in the posterior right iliac bone.Other bony metastases and bilateral heterogeneously enhancing renallesions are not subtantial changed.   11/22/2013 -  Chemotherapy Sutent 50 mg on Days 1-28, off days 29-42 is restarted.   Liver enzymes normal. Referral to hepatology if increases again.  Consider afinitor as an alternative treatment.     INTERVAL HISTORY: [name] 63 y.o. male with a history of Stage IV ccRCC with mets to bone is here for follow-up. He was last seen by me on 01/22/2014.  Today, he is accompanied by his sister.  He will started his 2 weeks off on today.    He reports continued improvement in pain to his right hip.    He denies any fevers or chills or acute shortness of breath.  He denies any yellowing of his eyes.   He  denies abdominal pain, nausea/vomiting, diarrhea or rashes.   MEDICAL HISTORY: Past Medical History  Diagnosis Date  . Renal disorder   . Hypertension   . Dialysis patient   . Secondary hyperparathyroidism (of renal origin)     s/p parathyroidectomy  . Anemia due to chronic disease treated with erythropoietin   . Closed fracture of pubic ramus 08/2013    pathologic (metastatic renal  cell ca)  . History of radiation therapy 08/08/13-08/21/13    L-1-L5, ,lt hip/pelvis,prox rt femur  . 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    ALLERGIES:  has No Known Allergies.  MEDICATIONS: has a current medication list which includes the following prescription(s): alprazolam, calcium carbonate, cyclobenzaprine, dss, doxepin, ferrous sulfate, hydrocodone-acetaminophen, losartan, multivitamin, prochlorperazine, promethazine, ranitidine, eucerin, sodium polystyrene, sunitinib, and tramadol.  SURGICAL HISTORY:  Past Surgical History  Procedure Laterality Date  . Av fistula placement      REVIEW OF SYSTEMS:   Constitutional: Denies fevers, chills or abnormal weight loss; he reports a diminished appeitite.   Eyes: Denies blurriness of vision Ears, nose, mouth, throat, and face: Denies mucositis or sore throat Respiratory: Denies cough, dyspnea or wheezes Cardiovascular: Denies palpitation, chest discomfort or lower extremity swelling Gastrointestinal:  Denies nausea, heartburn or change in bowel habits Skin: Denies abnormal skin rashes Lymphatics: Denies new lymphadenopathy or easy bruising Neurological:Denies numbness, tingling or new weaknesses Behavioral/Psych: Mood is stable, no new changes  All other systems were reviewed with the patient and are negative.  PHYSICAL EXAMINATION: ECOG PERFORMANCE STATUS: 2 - Symptomatic, <50% confined to bed  Blood pressure 124/71, pulse 100, temperature 97.9 F (36.6 C), temperature source Oral, resp.  rate 18, height 5\' 7"  (1.702 m), weight 123 lb 3.2 oz (55.883 kg), SpO2 100.00%.  GENERAL:alert, no distress and comfortable; chronically ill appearing thin male with dry skin. Alopecia. Ambulates with a walker.  SKIN: skin color, texture, turgor are normal, no rashes or significant lesions EYES: normal, Conjunctiva are pink and non-injected, sclera clear OROPHARYNX:no exudate, no erythema and lips, buccal mucosa, and tongue normal; Poor dentition.  NECK: supple, thyroid normal size, non-tender, without nodularity LYMPH:  no palpable lymphadenopathy in the cervical, axillary or supraclavicular LUNGS: clear to auscultation with normal breathing effort, no wheezes or rhonchi HEART: regular rate & rhythm and no murmurs and no lower extremity edema ABDOMEN:abdomen soft, non-tender and normal bowel sounds Musculoskeletal:no cyanosis of digits and no clubbing; R AV fistula.  NEURO: alert & oriented x 3 with fluent speech, no focal motor/sensory deficits  Labs:  Lab Results  Component Value Date   WBC 5.9 04/18/2014   HGB 11.6* 04/18/2014   HCT 36.1* 04/18/2014   MCV 90.1 04/18/2014   PLT 381 04/18/2014   NEUTROABS 4.1 04/18/2014      Chemistry      Component Value Date/Time   NA 143 04/18/2014 1219   NA 136 08/06/2013 1045   NA 136 08/06/2013 1045   K 4.2 04/18/2014 1219   K 4.7 08/06/2013 1045   K 4.8 08/06/2013 1045   CL 93* 08/06/2013 1045   CL 93* 08/06/2013 1045  CO2 30* 04/18/2014 1219   CO2 25 08/06/2013 1045   CO2 25 08/06/2013 1045   BUN 29.6* 04/18/2014 1219   BUN 56* 08/06/2013 1045   BUN 57* 08/06/2013 1045   CREATININE 5.3* 04/18/2014 1219   CREATININE 8.52* 08/06/2013 1045   CREATININE 8.61* 08/06/2013 1045      Component Value Date/Time   CALCIUM 8.0* 04/18/2014 1219   CALCIUM 9.2 08/06/2013 1045   CALCIUM 9.2 08/06/2013 1045   ALKPHOS 99 04/18/2014 1219   ALKPHOS 84 12/20/2013 1034   AST 35* 04/18/2014 1219   AST 30 12/20/2013 1034   ALT 7 04/18/2014 1219   ALT 26 12/20/2013 1034    BILITOT 0.45 04/18/2014 1219   BILITOT 0.6 12/20/2013 1034       Basic Metabolic Panel:  Recent Labs Lab 04/18/14 1219  NA 143  K 4.2  CO2 30*  GLUCOSE 111  BUN 29.6*  CREATININE 5.3*  CALCIUM 8.0*   GFR Estimated Creatinine Clearance: 11.6 ml/min (by C-G formula based on Cr of 5.3). Liver Function Tests:  Recent Labs Lab 04/18/14 1219  AST 35*  ALT 7  ALKPHOS 99  BILITOT 0.45  PROT 8.3  ALBUMIN 2.7*    Studies:  No results found.   RADIOGRAPHIC STUDIES: 02/28/2014  CT CHEST, ABDOMEN, AND PELVIS WITH CONTRAST  TECHNIQUE: Multidetector CT imaging of the chest, abdomen and pelvis was performed following the standard protocol during bolus administration of intravenous contrast.  CONTRAST: 142mL OMNIPAQUE IOHEXOL 300 MG/ML SOLN COMPARISON: 11/20/2013 FINDINGS: CT CHEST FINDINGS Moderate paraseptal emphysematous changes. No suspicious pulmonary nodules. No pleural effusion or pneumothorax. Visualized thyroid is unremarkable.  The heart is normal in size. No pericardial effusion.  Small mediastinal nodes, similar, including: --9 mm short axis her paratracheal node (series 6/ image 23), unchanged  --15 mm short axis right paratracheal node (series 6/ image 34), previously 16 mm --6 mm short axis right azygoesophageal recess node (series 6/image 50), previously 8 mm 2.2 x 3.4 cm soft tissue lesion involving the right posterior medial 9th rib (series 6/image 62), previously 2.1 x 3.5 cm, with associated pathologic fracture. Healing fracture involving the right posterolateral 6th rib (series 6/image 42). Multiple old/healed left posterior rib fractures.  CT ABDOMEN AND PELVIS FINDINGS Liver, spleen, pancreas, and adrenal glands are within normal limits.  Status post cholecystectomy. No intrahepatic or extrahepatic ductal dilatation. Innumerable bilateral renal cysts of varying sizes and complexities. Three enhancing renal lesions, as follows: --3.2 x 2.8 cm enhancing mass is in the  lateral right upper kidney (series 6/image 99), previously 3.2 x 2.8 cm, unchanged --9 x 11 mm enhancing lesion in the lateral left upper kidney (series 6/image 101), previously 14 x 12 mm, mildly decreased --2.2 x 3.6 cm lesion in the anterior interpolar left kidney (series  6/image 113), previously 3.9 x 4.5 cm, decreased  No evidence of bowel obstruction. Atherosclerotic calcifications of the abdominal aorta and branch  vessels. No abdominopelvic ascites. No suspicious abdominopelvic lymphadenopathy. Prostatomegaly, which enlargement of the central gland which indents  the base of the bladder. Bladder is mildly thick-walled although underdistended. Destructive lesion with pathologic fracture involving the left superior pubic ramus measures approximately 2.1 x 3.6 cm (series 6/ image 24), previously 3.5 x 3.9 cm. Tumor involves the anterior/superior acetabulum with associated abnormal soft tissue  along the left hip joint (series 6/image 23) stable versus minimally decreased. 1.7 x 2.1 cm lytic lesion extending to the epidural space along the posterior aspect of the L5 vertebral  body (series 6/image 144), previously 2.0 x 2.1 cm. Additional dominant destructive lesion at L2 with perispinal tumor along the left aspect of L3 (series 6/ image 120), similar. 2.4 x 2.0 cm soft tissue lesion along the posterior aspect of the right iliac bone (series 6/ image 148), previously 1.9 x 2.4 cm. Healing fractures involving the left inferior pubic ramus (series 6/ images 195 and 199).  IMPRESSION: Three enhancing renal lesions, compatible with solid renal neoplasms as described above, mildly decreased. Multifocal destructive osseous metastases, as described above, mildly decreased. Mild mediastinal lymphadenopathy, grossly unchanged. Pathologic fractures involving the right posterior 9th rib and left superior pubic ramus. Additional healing fractures, as above.  ASSESSMENT: [name] 63 y.o. 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  His liver enzymes are within normal limits.  We will continue his sutent given his  response to therapy as noted by recent CT C/A/P and clinically.  He started his 2 week off period today.  --Adjunctive bone therapy. He is unlikely to be a candidate for bisphosphonate therapy given ESRDz and/or poor dentition. He is scheduled to see dentistry.  --We will re-stage with CT of Abdomen/chest with contrast on 05/28/2014.  He will require H/D the next day on 08/27. Next therapy likely afinitor if perssistent hepatotoxicity.    2. Hepatotoxicity secondary to Sutent, resolved.  --History of hepatitis C.  --He was counseled extensively to avoid tylenol and other acetaminophen containing products in the setting of his elevated liver enzymes.   3. ESRDz on H/D.  --Continue hemodialysis per nephrology with epo.   4. Hypertension.  --Continue amlodipine 10 mg, losartan 25 mg daily.   5. Anemia in chronic kidney disease +/- IDA, resolved  -- Continue epo and RBC transfusion prn symptoms.   6. Pathological fracture of Left hip secondary to #1.  --Completed XRT on 08/21/2013. Continue pain  control with oxycodone 5 mg q 6 hours (two tabs q 4 hours prn moderate pain) and tramadol 50 q 6 hours prn pain. May add long-acting if this is not enough.  Pain medications provided today including tramadol 50 mg q 6 hours (#60) and his norco/vicodin 5-325 mg (#60).   7. Anorexia and decreased appetite. --He is supplementing his diet with ensure/boost.   8. Follow-up.  --He will follow up for a symptom check in 6 weeks and labs including a CBC and chemistries and CT C/A/P on 05/28/2014.  All questions were answered. The patient knows to call the clinic with any problems, questions or concerns. We can certainly see the patient much sooner if necessary.  I spent 15 minutes counseling the patient face to face. The total time spent in the appointment was 25 minutes.    Abdirahman Chittum, MD 04/18/2014 2:34 PM

## 2014-05-05 ENCOUNTER — Telehealth (HOSPITAL_COMMUNITY): Payer: Self-pay | Admitting: Dentistry

## 2014-05-05 ENCOUNTER — Other Ambulatory Visit (HOSPITAL_COMMUNITY): Payer: Medicare Other | Admitting: Dentistry

## 2014-05-05 ENCOUNTER — Telehealth (HOSPITAL_COMMUNITY): Payer: Self-pay

## 2014-05-05 NOTE — Telephone Encounter (Signed)
05/05/2014  Patient:            [name] Date of Birth:  08/07/1951 MRN:                [id number]  [place]. NO SHOW. NO CALL. Original dental appointment was scheduled through the sister for today at 8 AM.  Left message with sister's cell phone to call and reschedule appointment if treatment is desired. There was no answer at the patient's home phone number. Lenn Cal, DDS

## 2014-05-28 ENCOUNTER — Other Ambulatory Visit: Payer: Medicare Other

## 2014-05-28 ENCOUNTER — Telehealth: Payer: Self-pay | Admitting: Internal Medicine

## 2014-05-28 ENCOUNTER — Ambulatory Visit (HOSPITAL_COMMUNITY): Payer: Medicare Other

## 2014-05-28 NOTE — Telephone Encounter (Signed)
s.w. pt and r/s appt per pt request..Marland Kitchenpt will call radiology to r/s ct

## 2014-05-30 ENCOUNTER — Ambulatory Visit: Payer: Medicare Other

## 2014-06-02 ENCOUNTER — Other Ambulatory Visit: Payer: Self-pay | Admitting: Hematology

## 2014-06-02 ENCOUNTER — Encounter (HOSPITAL_COMMUNITY): Payer: Self-pay

## 2014-06-02 ENCOUNTER — Other Ambulatory Visit (HOSPITAL_BASED_OUTPATIENT_CLINIC_OR_DEPARTMENT_OTHER): Payer: Medicare Other

## 2014-06-02 ENCOUNTER — Ambulatory Visit (HOSPITAL_COMMUNITY)
Admission: RE | Admit: 2014-06-02 | Discharge: 2014-06-02 | Disposition: A | Discharge status: Home / Self Care | Payer: Medicare Other | Source: Ambulatory Visit | Attending: Internal Medicine | Admitting: Internal Medicine

## 2014-06-02 DIAGNOSIS — R599 Enlarged lymph nodes, unspecified: Secondary | ICD-10-CM | POA: Diagnosis not present

## 2014-06-02 DIAGNOSIS — M8440XP Pathological fracture, unspecified site, subsequent encounter for fracture with malunion: Secondary | ICD-10-CM

## 2014-06-02 DIAGNOSIS — Z992 Dependence on renal dialysis: Secondary | ICD-10-CM | POA: Insufficient documentation

## 2014-06-02 DIAGNOSIS — C7951 Secondary malignant neoplasm of bone: Secondary | ICD-10-CM | POA: Insufficient documentation

## 2014-06-02 DIAGNOSIS — N186 End stage renal disease: Secondary | ICD-10-CM | POA: Diagnosis not present

## 2014-06-02 DIAGNOSIS — IMO0002 Reserved for concepts with insufficient information to code with codable children: Secondary | ICD-10-CM | POA: Diagnosis not present

## 2014-06-02 DIAGNOSIS — C7952 Secondary malignant neoplasm of bone marrow: Secondary | ICD-10-CM

## 2014-06-02 DIAGNOSIS — R918 Other nonspecific abnormal finding of lung field: Secondary | ICD-10-CM | POA: Insufficient documentation

## 2014-06-02 DIAGNOSIS — C649 Malignant neoplasm of unspecified kidney, except renal pelvis: Secondary | ICD-10-CM | POA: Insufficient documentation

## 2014-06-02 LAB — CBC WITH DIFFERENTIAL/PLATELET
BASO%: 0.4 % (ref 0.0–2.0)
Basophils Absolute: 0 10*3/uL (ref 0.0–0.1)
EOS%: 2 % (ref 0.0–7.0)
Eosinophils Absolute: 0.1 10*3/uL (ref 0.0–0.5)
HCT: 34 % — ABNORMAL LOW (ref 38.4–49.9)
HGB: 11.7 g/dL — ABNORMAL LOW (ref 13.0–17.1)
LYMPH%: 35.8 % (ref 14.0–49.0)
MCH: 29.4 pg (ref 27.2–33.4)
MCHC: 34.4 g/dL (ref 32.0–36.0)
MCV: 85.4 fL (ref 79.3–98.0)
MONO#: 0.8 10*3/uL (ref 0.1–0.9)
MONO%: 16.4 % — ABNORMAL HIGH (ref 0.0–14.0)
NEUT#: 2.2 10*3/uL (ref 1.5–6.5)
NEUT%: 45.4 % (ref 39.0–75.0)
Platelets: 199 10*3/uL (ref 140–400)
RBC: 3.98 10*6/uL — AB (ref 4.20–5.82)
RDW: 16.9 % — ABNORMAL HIGH (ref 11.0–14.6)
WBC: 4.9 10*3/uL (ref 4.0–10.3)
lymph#: 1.8 10*3/uL (ref 0.9–3.3)

## 2014-06-02 LAB — COMPREHENSIVE METABOLIC PANEL (CC13)
ALT: 23 U/L (ref 0–55)
ANION GAP: 15 meq/L — AB (ref 3–11)
AST: 29 U/L (ref 5–34)
Albumin: 3.3 g/dL — ABNORMAL LOW (ref 3.5–5.0)
Alkaline Phosphatase: 119 U/L (ref 40–150)
BUN: 55.6 mg/dL — ABNORMAL HIGH (ref 7.0–26.0)
CALCIUM: 8.5 mg/dL (ref 8.4–10.4)
CO2: 28 meq/L (ref 22–29)
Chloride: 94 mEq/L — ABNORMAL LOW (ref 98–109)
Creatinine: 7.2 mg/dL (ref 0.7–1.3)
Glucose: 90 mg/dl (ref 70–140)
Potassium: 4.9 mEq/L (ref 3.5–5.1)
SODIUM: 137 meq/L (ref 136–145)
TOTAL PROTEIN: 8.7 g/dL — AB (ref 6.4–8.3)
Total Bilirubin: 0.49 mg/dL (ref 0.20–1.20)

## 2014-06-02 LAB — LACTATE DEHYDROGENASE (CC13): LDH: 240 U/L (ref 125–245)

## 2014-06-02 MED ORDER — IOHEXOL 300 MG/ML  SOLN
80.0000 mL | Freq: Once | INTRAMUSCULAR | Status: AC | PRN
  Administered 2014-06-02: 80 mL via INTRAVENOUS

## 2014-06-04 ENCOUNTER — Other Ambulatory Visit: Payer: Medicare Other

## 2014-06-10 ENCOUNTER — Other Ambulatory Visit: Payer: Self-pay | Admitting: Internal Medicine

## 2014-06-11 ENCOUNTER — Other Ambulatory Visit: Payer: Self-pay

## 2014-06-11 ENCOUNTER — Ambulatory Visit (HOSPITAL_BASED_OUTPATIENT_CLINIC_OR_DEPARTMENT_OTHER): Payer: Medicare Other | Admitting: Hematology

## 2014-06-11 ENCOUNTER — Encounter: Payer: Self-pay | Admitting: Hematology

## 2014-06-11 VITALS — BP 125/81 | HR 86 | Temp 97.9°F | Resp 18 | Ht 67.0 in | Wt 125.8 lb

## 2014-06-11 DIAGNOSIS — C7952 Secondary malignant neoplasm of bone marrow: Secondary | ICD-10-CM

## 2014-06-11 DIAGNOSIS — C7951 Secondary malignant neoplasm of bone: Principal | ICD-10-CM

## 2014-06-11 DIAGNOSIS — N186 End stage renal disease: Secondary | ICD-10-CM

## 2014-06-11 DIAGNOSIS — Z992 Dependence on renal dialysis: Secondary | ICD-10-CM

## 2014-06-11 DIAGNOSIS — C649 Malignant neoplasm of unspecified kidney, except renal pelvis: Secondary | ICD-10-CM

## 2014-06-11 MED ORDER — CYCLOBENZAPRINE HCL 10 MG PO TABS: ORAL_TABLET | ORAL | Status: DC

## 2014-06-11 MED ORDER — ALPRAZOLAM 0.25 MG PO TABS: 0.2500 mg | ORAL_TABLET | Freq: Two times a day (BID) | ORAL | Status: DC | PRN

## 2014-06-11 MED ORDER — EVEROLIMUS 2.5 MG PO TABS: 2.5000 mg | ORAL_TABLET | Freq: Every day | ORAL | Status: DC

## 2014-06-11 MED ORDER — PROCHLORPERAZINE MALEATE 10 MG PO TABS: ORAL_TABLET | ORAL | Status: DC

## 2014-06-11 MED ORDER — EVEROLIMUS 5 MG PO TABS: 5.0000 mg | ORAL_TABLET | Freq: Every day | ORAL | Status: DC

## 2014-06-11 MED ORDER — HYDROCODONE-ACETAMINOPHEN 5-325 MG PO TABS: 1.0000 | ORAL_TABLET | Freq: Two times a day (BID) | ORAL | Status: DC | PRN

## 2014-06-11 NOTE — Progress Notes (Signed)
[name] OFFICE PROGRESS NOTE  CHISM, DAVID, MD [place] [address]  DIAGNOSIS: Metastatic Renal Cell Carcinoma (RCC) to discuss restaging scans. Patient with ESRD on HD.  CURRENT THERAPY: Sutent 50 mg daily on days 1-28, off on 29-42. 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  Seen 2 weeks before by PCP/Orthopedics with X-ray of hip and CXR revealing lytic sxpansile lesion with pathologic fracture, left inferior lesion.  Onc (Dr. Juliann Mule) consulted on 10/29; Rad onc (Dr. Lisbeth Renshaw) on 11/01   07/31/2013 Imaging CT C/A/P hyperemic 2.3 cm R paratracheal NL, smaller enlarged precarinal, subcarinal, pretracheal, adn prevascular LN, lesion in the proximal R 9th rib, smaller lytic lesion with path fracture of lateral R 6 th rib; 4.5 cm L kidney mass; 3 cm R kid mass   07/31/2013 Imaging MRI,  L hip on admission revealed a 6x6x5 cm homogeneous destructive soft tissue mass destroying the anterior medial and anterior superior aspects of the left acetabulum and lateral aspect of the left surperior pubic ramus.  2 cm mass in the post L5.   08/02/2013 Procedure CT Guide biopsy of left pelci soft tissue mass.    08/05/2013 Pathology Results Metastatic renal cell carcinoma, clear cell type.     08/05/2013 Cancer Staging Stage IV, RCC with metastases to the bone.   08/08/2013 - 08/21/2013 Radiation Therapy Treated to 3 distinct areas corresponding to the lumbar spine from approximately L1-L5, the left hip/pelvis, in the proximal right femur. Each of these separate target areas were treated with 2 fields each. The target areas each received 30 gray in 10 fra   08/24/2013 - 10/17/2013 Chemotherapy Started Sutent 50 mg on Days 1-28, off days 29-42.     10/17/2013 Adverse Reaction Grade 4 elevated Transiminases with normal Bilirubin.  Sutent held until  resolution.   11/17/2013 Imaging Re-staging CT C/A/P:Apparent interval response to therapy with decrease in mediastinal LAD and a lesion in the posterior right iliac bone.Other bony metastases and bilateral heterogeneously enhancing renallesions are not subtantial changed.   11/22/2013 -  Chemotherapy Sutent 50 mg on Days 1-28, off days 29-42 is restarted.   Liver enzymes normal. Referral to hepatology if increases again.  Consider afinitor as an alternative treatment.    02/28/2014 Imaging CAT scan CAP decrease in size of 2 renal lesions, 1 stable.decrease in osseous metastatic disease. Mediastinal LAD stable, Overall impresion stable disease to mild improvement   03/07/2014 - 06/11/2014 Chemotherapy Sutent was continued after that through now. Liver functions have been fine.   06/02/2014 Imaging CT scan CAP shows incraese in mediastinal adenopathy, left kidney lesion size incraese, new pulmonary nodules, progression seen in multi focal lytic bone metastasis (right rib lesion and L2 vertebra worse)    INTERVAL HISTORY: [name] 63 y.o. male with a history of Stage IV ccRCC with mets to bone is here for follow-up. He was last seen by Dr Juliann Mule on 04/18/2014. He reports continued improvement in pain to his right hip.    He denies any fevers or chills or acute shortness of breath.  He denies any yellowing of his eyes.   He denies abdominal pain, nausea/vomiting, diarrhea or rashes. He was given refills on his flexeril, Norco, compazine today.he denies any worsening in his pain specifically at sites of skeletal mets.  MEDICAL HISTORY: Past Medical History  Diagnosis  Date  . Renal disorder   . Hypertension   . Dialysis patient   . Secondary hyperparathyroidism (of renal origin)     s/p parathyroidectomy  . Anemia due to chronic disease treated with erythropoietin   . Closed fracture of pubic ramus 08/2013    pathologic (metastatic renal cell ca)  . History of radiation therapy 08/08/13-08/21/13     L-1-L5, ,lt hip/pelvis,prox rt femur  . 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    ALLERGIES:  has No Known Allergies.  MEDICATIONS: has a current medication list which includes the following prescription(s): alprazolam, calcium carbonate, cyclobenzaprine, dss, doxepin, ferrous sulfate, hydrocodone-acetaminophen, losartan, multivitamin, prochlorperazine, ranitidine, eucerin, tramadol, everolimus, and everolimus.  SURGICAL HISTORY:  Past Surgical History  Procedure Laterality Date  . Av fistula placement      REVIEW OF SYSTEMS:   Constitutional: Denies fevers, chills or abnormal weight loss; he reports a diminished appeitite.   Eyes: Denies blurriness of vision Ears, nose, mouth, throat, and face: Denies mucositis or sore throat Respiratory: Denies cough, dyspnea or wheezes Cardiovascular: Denies palpitation, chest discomfort or lower extremity swelling Gastrointestinal:  Denies nausea, heartburn or change in bowel habits Skin: Denies abnormal skin rashes Lymphatics: Denies new lymphadenopathy or easy bruising Neurological:Denies numbness, tingling or new weaknesses Behavioral/Psych: Mood is stable, no new changes  All other systems were reviewed with the patient and are negative.  PHYSICAL EXAMINATION: ECOG PERFORMANCE STATUS: 2, KPS 70-80  Blood pressure 125/81, pulse 86, temperature 97.9 F (36.6 C), temperature source Oral, resp. rate 18, height 5\' 7"  (1.702 m), weight 125 lb 12.8 oz (57.063 kg).  GENERAL:alert, no distress and  comfortable; chronically ill appearing thin male with dry skin. Alopecia. Ambulates with a walker.  SKIN: skin color, texture, turgor are normal, no rashes or significant lesions EYES: normal, Conjunctiva are pink and non-injected, sclera clear OROPHARYNX:no exudate, no erythema and lips, buccal mucosa, and tongue normal; Poor dentition.  NECK: supple, thyroid normal size, non-tender, without nodularity LYMPH:  no palpable lymphadenopathy in the cervical, axillary or supraclavicular LUNGS: clear to auscultation with normal breathing effort, no wheezes or rhonchi HEART: regular rate & rhythm and no murmurs and no lower extremity edema ABDOMEN:abdomen soft, non-tender and normal bowel sounds Musculoskeletal:no cyanosis of digits and no clubbing; R AV fistula.  NEURO: alert & oriented x 3 with fluent speech, no focal motor/sensory deficits  Labs:          RADIOGRAPHIC STUDIES: 02/28/2014  CT CHEST, ABDOMEN, AND PELVIS WITH CONTRAST  TECHNIQUE: Multidetector CT imaging of the chest, abdomen and pelvis was performed following the standard protocol during bolus administration of intravenous contrast.  CONTRAST: 168mL OMNIPAQUE IOHEXOL 300 MG/ML SOLN COMPARISON: 11/20/2013 FINDINGS: CT CHEST FINDINGS Moderate paraseptal emphysematous changes. No suspicious pulmonary nodules. No pleural effusion or pneumothorax. Visualized thyroid is unremarkable. The heart is normal in size. No pericardial effusion. Small mediastinal nodes, similar, including: --9 mm short axis her paratracheal node (series 6/ image 23), unchanged  --15 mm short axis right paratracheal node (series 6/ image 34), previously 16 mm --6 mm short axis right azygoesophageal recess node (series 6/image  50), previously 8 mm 2.2 x 3.4 cm soft tissue lesion involving the right posterior medial 9th rib (series 6/image 62), previously 2.1 x 3.5 cm, with associated pathologic fracture. Healing fracture involving the right posterolateral 6th  rib (series 6/image 42). Multiple old/healed left posterior rib fractures. CT ABDOMEN AND PELVIS FINDINGS Liver, spleen, pancreas, and adrenal glands are within normal limits. Status post choecystectomy. No intrahepatic or extrahepatic ductal dilatation. Innumerable bilateral renal cysts of varying sizes and complexities. Three enhancing renal lesions, as follows: --3.2 x 2.8 cm enhancing mass is in the lateral right upper kidney (series 6/image 99), previously 3.2 x 2.8 cm, unchanged --9 x 11 mm enhancing lesion in the lateral left upper kidney (series 6/image 101), previously 14 x 12 mm, mildly decreased --2.2 x 3.6 cm lesion in the anterior interpolar left kidney (series 6/image 113), previously 3.9 x 4.5 cm, decreased No evidence of bowel obstruction. Atherosclerotic calcifications of the abdominal aorta and branch vessels. No abdominopelvic ascites. No suspicious abdominopelvic lymphadenopathy. Prostatomegaly, which enlargement of the central gland which indents the base of the bladder. Bladder is mildly thick-walled although underdistended. Destructive lesion with pathologic fracture involving the left superior pubic ramus measures approximately 2.1 x 3.6 cm (series 6/ image 24), previously 3.5 x 3.9 cm. Tumor involves the anterior/superior acetabulum with associated abnormal soft tissue along the left hip joint (series 6/image 23) stable versus minimally decreased. 1.7 x 2.1 cm lytic lesion extending to the epidural space along the posterior aspect of the L5 vertebral body (series 6/image 144), previously 2.0 x 2.1 cm. Additional dominant destructive lesion at L2 with perispinal tumor along the left aspect of L3 (series 6/ image 120), similar. 2.4 x 2.0 cm soft tissue lesion along the posterior aspect of the right iliac bone (series 6/ image 148), previously 1.9 x 2.4 cm. Healing fractures involving the left inferior pubic ramus (series 6/ images 195 and 199). IMPRESSION: Three enhancing renal lesions,  compatible with solid renal neoplasms as described above, mildly decreased. Multifocal destructive osseous metastases, as described above, mildly decreased. Mild mediastinal lymphadenopathy, grossly unchanged. Pathologic fractures involving the right posterior 9th rib and left superior pubic ramus. Additional healing fractures, as above.  CT CHEST, ABDOMEN AND PELVIS WITH CONTRAST 06/02/2014 CLINICAL DATA: Metastatic renal cell carcinoma. EXAM: CT CHEST, ABDOMEN, AND PELVIS WITH CONTRAST  TECHNIQUE: Multidetector CT imaging of the chest, abdomen and pelvis was performed following the standard protocol during bolus administration of intravenous contrast. CONTRAST: 56mL OMNIPAQUE IOHEXOL 300 MG/ML SOLN COMPARISON: 02/28/2014  FINDINGS: CT CHEST FINDINGS The heart size is normal. There is no pericardial effusion. Calcified atherosclerotic disease involves the thoracic aorta. Enlarged right paratracheal lymph node measures 1.6 cm, image 21/series 2. Previously this measured the same. There is an enlarged right paratracheal node which measures 1 cm, image 16/series 2. Previously this measured 9 mm. Sub- carinal lymph node measures 9  mm, image 30/series 2. On the previous exam this measured 6 mm. There is no pericardial effusion. New subpleural nodule in the right midlung measures 5 mm, image 27/series 4. Within the right lower lobe there is a stable 3 mm nodule, image number 37/series 4. CT ABDOMEN AND PELVIS FINDINGS There is no suspicious liver abnormality. Previous cholecystectomy. No biliary dilatation. Normal appearance of the pancreas. The spleen is normal. The adrenal glands are both normal. Changes of polycystic kidney disease identified. There is a enhancing mass within the anterior aspect of the left kidney 3.8 x 3.9 cm, image 67/series 2. Previously this measured  3.2 x 3.6 cm. The urinary bladder appears  collapsed. There is calcified atherosclerotic disease involving the abdominal aorta. No aneurysm.  There is no adenopathy identified within the abdomen or pelvis. No free fluid or fluid collections identified within the abdomen or the pelvis. The stomach is normal. The small bowel loops have a normal course and caliber without evidence for bowel obstruction. Normal appearance of the colon. Review of the visualized osseous structures is again significant for multifocal lytic bone metastasis. Right ninth rib lesion measured  2.7 x 3.9 cm, image 35/ series 2. This is compared with (when remeasured) 2.0 x 3.0 cm previously. Destructive lesion involving the L2 vertebra with associated pathologic compression fracture is again noted. There is a further loss of bone and vertebral body height at this level. There is a large lytic lesion involving the posterior aspect of the L5 vertebral body. This measures 1.7 cm, image 60/ series 603. This is similar to previous exam and may be of  impending orthopedic significance. Destructive lesion involving the left superior acetabulum and superior pubic rami is again noted with associated pathologic fractures. This is similar to the previous exam.  IMPRESSION: 1. The mediastinal adenopathy demonstrates mild increase in size in the interval.  2. Interval increase in size of left kidney lesion. 3. Small pulmonary nodules 1 of which is new from previous exam.  4. Mild progression of multi focal lytic bone metastasis. Specifically, there has been increase in size of right rib lesion  and further destruction of the L2 vertebra. Thin Electronically Signed By: Kerby Moors M.D. On: 06/02/2014 15:56                        ASSESSMENT: Tank Difiore 63 y.o. male with a history of metastatic RCC, now refractory to Sutent.  PLAN:   PLAN:  1. 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 Plan starting Everolimus (AFINITOR) as second line therapy for metastatic RCC.  It is approved by FDA in adults with advanced renal cell carcinoma (RCC)  after failure of treatment with sunitinib or sorafenib. -- The usual dose is 10 mg daily but based on his renal status and past shock liver picture with Sutent, I will start him on 7.5 mg daily dose from 06/23/2014. The warnings and precautions with this drug include following:  Non-infectious pneumonitis: we monitor for clinical symptoms or radiological changes; fatal cases have occurred. Manage by dose reduction or discontinuation until symptoms resolve, and consider use of corticosteroids.   . Infections: Increased risk of infections, some fatal. Monitor for signs and symptoms, and treat promptly.  . Angioedema: Patients taking concomitant ACE inhibitor therapy may be at increased risk for angioedema.  . Oral ulceration: Mouth ulcers, stomatitis, and oral mucositis are common. Management includes mouthwashes and topical treatments.  . Renal failure: Cases of renal failure (including acute renal failure), some with a fatal outcome, have been observed. Our patient is already on hemodialysis. . Impaired wound healing: Increased risk of wound-related complications. Monitor signs and symptoms. Exercise caution in the peri-surgical period.  . Laboratory test alterations: Elevations of serum creatinine, urinary protein, blood glucose, and lipids may occur. Decreases in hemoglobin, neutrophils, and platelets may also occur. Monitor renal function, blood glucose, lipids, and hematologic parameters prior to treatment and periodically thereafter.  . Vaccinations: Avoid live vaccines and close contact with those who have received live vaccines.  -- Most common adverse reactions (incidence ?30%) include stomatitis, infections, rash,  fatigue, diarrhea, edema, abdominal pain, nausea, fever, asthenia, cough, headache and decreased appetite.   --Adjunctive bone therapy. He is unlikely to be a candidate for bisphosphonate therapy given ESRDz and/or poor dentition. He is scheduled to see dentistry.    2.  Hepatotoxicity secondary to Sutent, resolved.  --History of hepatitis C.  --He was counseled extensively to avoid tylenol and other acetaminophen containing products in the setting of his elevated liver enzymes.  I am using modified dose Afinitor so that liver functions do not get deranged and we can titrate dose up later.  3. ESRDz on H/D.  --Continue hemodialysis per nephrology with epo.   4. Hypertension.  --Continue amlodipine 10 mg, losartan 25 mg daily.   5. Anemia in chronic kidney disease +/- IDA, resolved  -- Continue epo and RBC transfusion prn symptoms.   6. Pathological fracture of Left hip secondary to #1.  --Completed XRT on 08/21/2013. Continue pain control with oxycodone 5 mg q 6 hours (two tabs q 4 hours prn moderate pain) and tramadol 50 q 6 hours prn pain. May add long-acting if this is not enough.  Pain medications provided today including tramadol 50 mg q 6 hours (#60) and his norco/vicodin 5-325 mg (#60).   7. Anorexia and decreased appetite. --He is supplementing his diet with ensure/boost.   8. Follow-up.  --He will follow up for a symptom check in 4 weeks from time he start Afinitor around 07/21/14 and will have labs including a CBC, chemistries, lipase, magnesium,phosphorous,uric acid.  All questions were answered. The patient knows to call the clinic with any problems, questions or concerns. We can certainly see the patient much sooner if necessary.  I spent 30 minutes counseling the patient face to face. The total time spent in the appointment was 40 minutes.    Bernadene Bell, MD Medical Hematologist/Oncologist [place] Pager: [phone] Office No: [phone]

## 2014-07-10 ENCOUNTER — Other Ambulatory Visit: Payer: Self-pay | Admitting: *Deleted

## 2014-07-10 DIAGNOSIS — Z992 Dependence on renal dialysis: Secondary | ICD-10-CM

## 2014-07-10 DIAGNOSIS — C649 Malignant neoplasm of unspecified kidney, except renal pelvis: Secondary | ICD-10-CM

## 2014-07-10 DIAGNOSIS — C7951 Secondary malignant neoplasm of bone: Principal | ICD-10-CM

## 2014-07-10 DIAGNOSIS — N186 End stage renal disease: Secondary | ICD-10-CM

## 2014-07-10 MED ORDER — ALPRAZOLAM 0.25 MG PO TABS: 0.2500 mg | ORAL_TABLET | Freq: Two times a day (BID) | ORAL | Status: DC | PRN

## 2014-07-10 MED ORDER — HYDROCODONE-ACETAMINOPHEN 5-325 MG PO TABS: 1.0000 | ORAL_TABLET | Freq: Two times a day (BID) | ORAL | Status: DC | PRN

## 2014-07-10 MED ORDER — TRAMADOL HCL 50 MG PO TABS: 50.0000 mg | ORAL_TABLET | Freq: Four times a day (QID) | ORAL | Status: DC | PRN

## 2014-07-21 ENCOUNTER — Other Ambulatory Visit: Payer: Medicare Other

## 2014-07-21 ENCOUNTER — Ambulatory Visit: Payer: Medicare Other

## 2014-07-21 ENCOUNTER — Telehealth: Payer: Self-pay | Admitting: Hematology

## 2014-07-21 NOTE — Telephone Encounter (Signed)
Pt's daughter Kandice Moos called to r/s due to car trouble, Shurrane confirmed labs/ov req mailed sch confirmed..... KJ

## 2014-07-30 ENCOUNTER — Ambulatory Visit (HOSPITAL_BASED_OUTPATIENT_CLINIC_OR_DEPARTMENT_OTHER): Payer: Medicare Other | Admitting: Physician Assistant

## 2014-07-30 ENCOUNTER — Encounter: Payer: Self-pay | Admitting: Physician Assistant

## 2014-07-30 ENCOUNTER — Other Ambulatory Visit (HOSPITAL_BASED_OUTPATIENT_CLINIC_OR_DEPARTMENT_OTHER): Payer: Medicare Other

## 2014-07-30 VITALS — BP 123/72 | HR 86 | Temp 98.5°F | Resp 18 | Ht 67.0 in | Wt 129.0 lb

## 2014-07-30 DIAGNOSIS — C649 Malignant neoplasm of unspecified kidney, except renal pelvis: Secondary | ICD-10-CM

## 2014-07-30 DIAGNOSIS — Z992 Dependence on renal dialysis: Secondary | ICD-10-CM

## 2014-07-30 DIAGNOSIS — C7951 Secondary malignant neoplasm of bone: Secondary | ICD-10-CM

## 2014-07-30 DIAGNOSIS — N186 End stage renal disease: Secondary | ICD-10-CM

## 2014-07-30 LAB — CBC WITH DIFFERENTIAL/PLATELET
BASO%: 0.4 % (ref 0.0–2.0)
Basophils Absolute: 0 10*3/uL (ref 0.0–0.1)
EOS%: 5.2 % (ref 0.0–7.0)
Eosinophils Absolute: 0.4 10*3/uL (ref 0.0–0.5)
HEMATOCRIT: 33.6 % — AB (ref 38.4–49.9)
HEMOGLOBIN: 11.5 g/dL — AB (ref 13.0–17.1)
LYMPH#: 1.5 10*3/uL (ref 0.9–3.3)
LYMPH%: 22.8 % (ref 14.0–49.0)
MCH: 27.6 pg (ref 27.2–33.4)
MCHC: 34.2 g/dL (ref 32.0–36.0)
MCV: 80.8 fL (ref 79.3–98.0)
MONO#: 0.9 10*3/uL (ref 0.1–0.9)
MONO%: 12.7 % (ref 0.0–14.0)
NEUT#: 3.9 10*3/uL (ref 1.5–6.5)
NEUT%: 58.9 % (ref 39.0–75.0)
Platelets: 225 10*3/uL (ref 140–400)
RBC: 4.16 10*6/uL — ABNORMAL LOW (ref 4.20–5.82)
RDW: 15.9 % — ABNORMAL HIGH (ref 11.0–14.6)
WBC: 6.7 10*3/uL (ref 4.0–10.3)

## 2014-07-30 LAB — COMPREHENSIVE METABOLIC PANEL (CC13)
ALT: 35 U/L (ref 0–55)
AST: 36 U/L — ABNORMAL HIGH (ref 5–34)
Albumin: 3.2 g/dL — ABNORMAL LOW (ref 3.5–5.0)
Alkaline Phosphatase: 101 U/L (ref 40–150)
Anion Gap: 16 mEq/L — ABNORMAL HIGH (ref 3–11)
BUN: 44.2 mg/dL — AB (ref 7.0–26.0)
CALCIUM: 8 mg/dL — AB (ref 8.4–10.4)
CHLORIDE: 98 meq/L (ref 98–109)
CO2: 28 mEq/L (ref 22–29)
CREATININE: 6.7 mg/dL — AB (ref 0.7–1.3)
Glucose: 88 mg/dl (ref 70–140)
Potassium: 4.4 mEq/L (ref 3.5–5.1)
Sodium: 142 mEq/L (ref 136–145)
Total Bilirubin: 0.38 mg/dL (ref 0.20–1.20)
Total Protein: 8.6 g/dL — ABNORMAL HIGH (ref 6.4–8.3)

## 2014-07-30 LAB — PHOSPHORUS: PHOSPHORUS: 3.4 mg/dL (ref 2.3–4.6)

## 2014-07-30 LAB — URIC ACID (CC13): Uric Acid, Serum: 5 mg/dl (ref 2.6–7.4)

## 2014-07-30 LAB — LIPASE: LIPASE: 40 U/L (ref 0–75)

## 2014-07-30 LAB — LACTATE DEHYDROGENASE (CC13): LDH: 342 U/L — ABNORMAL HIGH (ref 125–245)

## 2014-07-30 LAB — MAGNESIUM (CC13): Magnesium: 2.4 mg/dl (ref 1.5–2.5)

## 2014-07-30 MED ORDER — CYCLOBENZAPRINE HCL 10 MG PO TABS: ORAL_TABLET | ORAL | Status: DC

## 2014-07-30 NOTE — Progress Notes (Signed)
[facility] OFFICE PROGRESS NOTE  CHISM, DAVID, MD [place] [address]  DIAGNOSIS: Metastatic Renal Cell Carcinoma (RCC) to discuss restaging scans. Patient with ESRD on HD.   PRIOR THERAPY:  Sutent 50 mg daily on days 1-28, off on 29-42. Held as noted above and re-started on 11/22/2013. Discontinued on 06/11/2014 due to hepatotoxicity.   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  Seen 2 weeks before by PCP/Orthopedics with X-ray of hip and CXR revealing lytic sxpansile lesion with pathologic fracture, left inferior lesion.  Onc (Dr. Juliann Mule) consulted on 10/29; Rad onc (Dr. Lisbeth Renshaw) on 11/01   07/31/2013 Imaging CT C/A/P hyperemic 2.3 cm R paratracheal NL, smaller enlarged precarinal, subcarinal, pretracheal, adn prevascular LN, lesion in the proximal R 9th rib, smaller lytic lesion with path fracture of lateral R 6 th rib; 4.5 cm L kidney mass; 3 cm R kid mass   07/31/2013 Imaging MRI,  L hip on admission revealed a 6x6x5 cm homogeneous destructive soft tissue mass destroying the anterior medial and anterior superior aspects of the left acetabulum and lateral aspect of the left surperior pubic ramus.  2 cm mass in the post L5.   08/02/2013 Procedure CT Guide biopsy of left pelci soft tissue mass.    08/05/2013 Pathology Results Metastatic renal cell carcinoma, clear cell type.     08/05/2013 Cancer Staging Stage IV, RCC with metastases to the bone.   08/08/2013 - 08/21/2013 Radiation Therapy Treated to 3 distinct areas corresponding to the lumbar spine from approximately L1-L5, the left hip/pelvis, in the proximal right femur. Each of these separate target areas were treated with 2 fields each. The target areas each received 30 gray in 10 fra   08/24/2013 - 10/17/2013 Chemotherapy Started Sutent 50 mg on Days 1-28,  off days 29-42.     10/17/2013 Adverse Reaction Grade 4 elevated Transiminases with normal Bilirubin.  Sutent held until resolution.   11/17/2013 Imaging Re-staging CT C/A/P:Apparent interval response to therapy with decrease in mediastinal LAD and a lesion in the posterior right iliac bone.Other bony metastases and bilateral heterogeneously enhancing renallesions are not subtantial changed.   11/22/2013 -  Chemotherapy Sutent 50 mg on Days 1-28, off days 29-42 is restarted.   Liver enzymes normal. Referral to hepatology if increases again.  Consider afinitor as an alternative treatment.    02/28/2014 Imaging CAT scan CAP decrease in size of 2 renal lesions, 1 stable.decrease in osseous metastatic disease. Mediastinal LAD stable, Overall impresion stable disease to mild improvement   03/07/2014 - 06/11/2014 Chemotherapy Sutent was continued after that through now. Liver functions have been fine.   06/02/2014 Imaging CT scan CAP shows incraese in mediastinal adenopathy, left kidney lesion size incraese, new pulmonary nodules, progression seen in multi focal lytic bone metastasis (right rib lesion and L2 vertebra worse)    INTERVAL HISTORY: [name] 63 y.o. male with a history of Stage IV ccRCC with mets to bone is here for follow-up. He was last seen by Dr Lona Kettle on 06/23/2014. He was started on Afinitor at 7.5 mg by mouth daily on 06/23/2014. He has been tolerating this medication without difficulty. He reports continued improvement in pain to his right hip. He denies any fevers or chills or acute shortness of breath.  He denies any yellowing of his eyes.  He denies abdominal pain, nausea/vomiting, diarrhea or rashes. He requests a refill of his muscle relaxer. This helps tremendously with the back pain and spasms he experiences during dialysis. His last CT scan was 06/02/2014. He denies any worsening in his pain specifically at sites of skeletal mets.  MEDICAL HISTORY: Past Medical History  Diagnosis Date   . Renal disorder   . Hypertension   . Dialysis patient   . Secondary hyperparathyroidism (of renal origin)     s/p parathyroidectomy  . Anemia due to chronic disease treated with erythropoietin   . Closed fracture of pubic ramus 08/2013    pathologic (metastatic renal cell ca)  . History of radiation therapy 08/08/13-08/21/13    L-1-L5, ,lt hip/pelvis,prox rt femur  . 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    ALLERGIES:  has No Known Allergies.  MEDICATIONS: has a current medication list which includes the following prescription(s): alprazolam, calcium carbonate, cyclobenzaprine, doxepin, everolimus, everolimus, hydrocodone-acetaminophen, losartan, prochlorperazine, eucerin, tramadol, dss, ferrous sulfate, multivitamin, and ranitidine.  SURGICAL HISTORY:  Past Surgical History  Procedure Laterality Date  . Av fistula placement      REVIEW OF SYSTEMS:   Constitutional: Denies fevers, chills or abnormal weight loss; he reports a diminished appeitite.   Eyes: Denies blurriness of vision Ears, nose, mouth, throat, and face: Denies mucositis or sore throat Respiratory: Denies cough, dyspnea or wheezes Cardiovascular: Denies palpitation, chest discomfort or lower extremity swelling Gastrointestinal:  Denies nausea, heartburn or change in bowel habits Skin: Denies abnormal skin rashes Lymphatics: Denies new lymphadenopathy or easy bruising Neurological:Denies numbness, tingling or new weaknesses Behavioral/Psych: Mood is stable, no new changes   All other systems were reviewed with the patient and are negative.  PHYSICAL EXAMINATION: ECOG PERFORMANCE STATUS: 2, KPS 70-80  Blood pressure 123/72, pulse 86, temperature 98.5 F (36.9 C), temperature source Oral, resp. rate 18, height 5\' 7"  (1.702 m), weight 129 lb (58.514 kg), SpO2 96.00%.  GENERAL:alert, no distress and comfortable; chronically ill appearing thin male with dry skin. Alopecia. Ambulates with a walker.  SKIN: skin color, texture, turgor are normal, no rashes or significant lesions EYES: normal, Conjunctiva are pink and non-injected, sclera clear OROPHARYNX:no exudate, no erythema and lips, buccal mucosa, and tongue normal; Poor dentition.  NECK: supple, thyroid normal size, non-tender, without nodularity LYMPH:  no palpable lymphadenopathy in the cervical, axillary or supraclavicular LUNGS: clear to auscultation with normal breathing effort, no wheezes or rhonchi HEART: regular rate & rhythm and no murmurs and no lower extremity edema ABDOMEN:abdomen soft, non-tender and normal bowel sounds Musculoskeletal:no cyanosis of digits and no clubbing; R AV fistula.  NEURO: alert & oriented x 3 with fluent speech, no focal motor/sensory deficits  Labs:  CBC Latest Ref Rng 07/30/2014 06/02/2014 04/18/2014  WBC 4.0 - 10.3 10e3/uL 6.7 4.9 5.9  Hemoglobin 13.0 - 17.1 g/dL 11.5(L) 11.7(L) 11.6(L)  Hematocrit 38.4 - 49.9 % 33.6(L) 34.0(L) 36.1(L)  Platelets 140 - 400 10e3/uL 225 199 381   CMP     Component Value Date/Time   NA 142 07/30/2014 1422   NA 136 08/06/2013 1045   NA  136 08/06/2013 1045   K 4.4 07/30/2014 1422   K 4.7 08/06/2013 1045   K 4.8 08/06/2013 1045   CL 93* 08/06/2013 1045   CL 93* 08/06/2013 1045   CO2 28 07/30/2014 1422   CO2 25 08/06/2013 1045   CO2 25 08/06/2013 1045   GLUCOSE 88 07/30/2014 1422   GLUCOSE 127* 08/06/2013 1045   GLUCOSE 121* 08/06/2013 1045   BUN 44.2* 07/30/2014 1422   BUN 56* 08/06/2013 1045   BUN 57* 08/06/2013 1045   CREATININE 6.7*  07/30/2014 1422   CREATININE 8.52* 08/06/2013 1045   CREATININE 8.61* 08/06/2013 1045   CALCIUM 8.0* 07/30/2014 1422   CALCIUM 9.2 08/06/2013 1045   CALCIUM 9.2 08/06/2013 1045   PROT 8.6* 07/30/2014 1422   PROT 7.7 12/20/2013 1034   ALBUMIN 3.2* 07/30/2014 1422   ALBUMIN 3.7 12/20/2013 1034   AST 36* 07/30/2014 1422   AST 30 12/20/2013 1034   ALT 35 07/30/2014 1422   ALT 26 12/20/2013 1034   ALKPHOS 101 07/30/2014 1422   ALKPHOS 84 12/20/2013 1034   BILITOT 0.38 07/30/2014 1422   BILITOT 0.6 12/20/2013 1034   GFRNONAA 6* 08/06/2013 1045   GFRNONAA 6* 08/06/2013 1045   GFRAA 7* 08/06/2013 1045   GFRAA 7* 08/06/2013 1045    RADIOGRAPHIC STUDIES: 02/28/2014  CT CHEST, ABDOMEN, AND PELVIS WITH CONTRAST  TECHNIQUE: Multidetector CT imaging of the chest, abdomen and pelvis was performed following the standard protocol during bolus administration of intravenous contrast.  CONTRAST: 134mL OMNIPAQUE IOHEXOL 300 MG/ML SOLN COMPARISON: 11/20/2013 FINDINGS: CT CHEST FINDINGS Moderate paraseptal emphysematous changes. No suspicious pulmonary nodules. No pleural effusion or pneumothorax. Visualized thyroid is unremarkable. The heart is normal in size. No pericardial effusion. Small mediastinal nodes, similar, including: --9 mm short axis her paratracheal node (series 6/ image 23), unchanged  --15 mm short axis right paratracheal node (series 6/ image 34), previously 16 mm --6 mm short axis right azygoesophageal recess node (series 6/image 50), previously 8 mm 2.2 x 3.4 cm soft tissue lesion involving the right posterior medial 9th rib (series 6/image 62), previously 2.1 x 3.5 cm, with associated pathologic fracture. Healing fracture involving the right posterolateral 6th rib (series 6/image 42). Multiple old/healed left posterior rib fractures. CT ABDOMEN AND PELVIS FINDINGS Liver, spleen, pancreas, and adrenal glands are within normal limits. Status post choecystectomy. No intrahepatic or extrahepatic ductal  dilatation. Innumerable bilateral renal cysts of varying sizes and complexities. Three enhancing renal lesions, as follows: --3.2 x 2.8 cm enhancing mass is in the lateral right upper kidney (series 6/image 99), previously 3.2 x 2.8 cm, unchanged --9 x 11 mm enhancing lesion in the lateral left upper kidney (series 6/image 101), previously 14 x 12 mm, mildly decreased --2.2 x 3.6 cm lesion in the anterior interpolar left kidney (series 6/image 113), previously 3.9 x 4.5 cm, decreased No evidence of bowel obstruction. Atherosclerotic calcifications of the abdominal aorta and branch vessels. No abdominopelvic ascites. No suspicious abdominopelvic lymphadenopathy. Prostatomegaly, which enlargement of the central gland which indents the base of the bladder. Bladder is mildly thick-walled although underdistended. Destructive lesion with pathologic fracture involving the left superior pubic ramus measures approximately 2.1 x 3.6 cm (series 6/ image 24), previously 3.5 x 3.9 cm. Tumor involves the anterior/superior acetabulum with associated abnormal soft tissue along the left hip joint (series 6/image 23) stable versus minimally decreased. 1.7 x 2.1 cm lytic lesion extending to the epidural space along the posterior aspect of the L5 vertebral body (series  6/image 144), previously 2.0 x 2.1 cm. Additional dominant destructive lesion at L2 with perispinal tumor along the left aspect of L3 (series 6/ image 120), similar. 2.4 x 2.0 cm soft tissue lesion along the posterior aspect of the right iliac bone (series 6/ image 148), previously 1.9 x 2.4 cm. Healing fractures involving the left inferior pubic ramus (series 6/ images 195 and 199). IMPRESSION: Three enhancing renal lesions, compatible with solid renal neoplasms as described above, mildly decreased. Multifocal destructive osseous metastases, as described above, mildly decreased. Mild mediastinal lymphadenopathy, grossly unchanged. Pathologic fractures involving the  right posterior 9th rib and left superior pubic ramus. Additional healing fractures, as above.  CT CHEST, ABDOMEN AND PELVIS WITH CONTRAST 06/02/2014 CLINICAL DATA: Metastatic renal cell carcinoma. EXAM: CT CHEST, ABDOMEN, AND PELVIS WITH CONTRAST  TECHNIQUE: Multidetector CT imaging of the chest, abdomen and pelvis was performed following the standard protocol during bolus administration of intravenous contrast. CONTRAST: 78mL OMNIPAQUE IOHEXOL 300 MG/ML SOLN COMPARISON: 02/28/2014  FINDINGS: CT CHEST FINDINGS The heart size is normal. There is no pericardial effusion. Calcified atherosclerotic disease involves the thoracic aorta. Enlarged right paratracheal lymph node measures 1.6 cm, image 21/series 2. Previously this measured the same. There is an enlarged right paratracheal node which measures 1 cm, image 16/series 2. Previously this measured 9 mm. Sub- carinal lymph node measures 9  mm, image 30/series 2. On the previous exam this measured 6 mm. There is no pericardial effusion. New subpleural nodule in the right midlung measures 5 mm, image 27/series 4. Within the right lower lobe there is a stable 3 mm nodule, image number 37/series 4. CT ABDOMEN AND PELVIS FINDINGS There is no suspicious liver abnormality. Previous cholecystectomy. No biliary dilatation. Normal appearance of the pancreas. The spleen is normal. The adrenal glands are both normal. Changes of polycystic kidney disease identified. There is a enhancing mass within the anterior aspect of the left kidney 3.8 x 3.9 cm, image 67/series 2. Previously this measured 3.2 x 3.6 cm. The urinary bladder appears  collapsed. There is calcified atherosclerotic disease involving the abdominal aorta. No aneurysm. There is no adenopathy identified within the abdomen or pelvis. No free fluid or fluid collections identified within the abdomen or the pelvis. The stomach is normal. The small bowel loops have a normal course and caliber without evidence for  bowel obstruction. Normal appearance of the colon. Review of the visualized osseous structures is again significant for multifocal lytic bone metastasis. Right ninth rib lesion measured  2.7 x 3.9 cm, image 35/ series 2. This is compared with (when remeasured) 2.0 x 3.0 cm previously. Destructive lesion involving the L2 vertebra with associated pathologic compression fracture is again noted. There is a further loss of bone and vertebral body height at this level. There is a large lytic lesion involving the posterior aspect of the L5 vertebral body. This measures 1.7 cm, image 60/ series 603. This is similar to previous exam and may be of  impending orthopedic significance. Destructive lesion involving the left superior acetabulum and superior pubic rami is again noted with associated pathologic fractures. This is similar to the previous exam.  IMPRESSION: 1. The mediastinal adenopathy demonstrates mild increase in size in the interval.  2. Interval increase in size of left kidney lesion. 3. Small pulmonary nodules 1 of which is new from previous exam.  4. Mild progression of multi focal lytic bone metastasis. Specifically, there has been increase in size of right rib lesion  and further destruction of the  L2 vertebra. Thin Electronically Signed By: Kerby Moors M.D. On: 06/02/2014 15:56  ASSESSMENT: [name] 63 y.o. male with a history of metastatic RCC, now refractory to Sutent.   PLAN:  1. 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 Plan  Everolimus (AFINITOR) as second line therapy for metastatic RCC.  It is approved by FDA in adults with advanced renal cell carcinoma (RCC) after failure of treatment with sunitinib or sorafenib. -- The usual dose is 10 mg daily but based on his renal status and past shock liver picture with Sutent,Continue on 7.5 mg daily dose from 06/23/2014. The warnings and precautions with this drug include following:  Non-infectious pneumonitis: we  monitor for clinical symptoms or radiological changes; fatal cases have occurred. Manage by dose reduction or discontinuation until symptoms resolve, and consider use of corticosteroids.   . Infections: Increased risk of infections, some fatal. Monitor for signs and symptoms, and treat promptly.  . Angioedema: Patients taking concomitant ACE inhibitor therapy may be at increased risk for angioedema.  . Oral ulceration: Mouth ulcers, stomatitis, and oral mucositis are common. Management includes mouthwashes and topical treatments.  . Renal failure: Cases of renal failure (including acute renal failure), some with a fatal outcome, have been observed. Our patient is already on hemodialysis. . Impaired wound healing: Increased risk of wound-related complications. Monitor signs and symptoms. Exercise caution in the peri-surgical period.  . Laboratory test alterations: Elevations of serum creatinine, urinary protein, blood glucose, and lipids may occur. Decreases in hemoglobin, neutrophils, and platelets may also occur. Monitor renal function, blood glucose, lipids, and hematologic parameters prior to treatment and periodically thereafter.  . Vaccinations: Avoid live vaccines and close contact with those who have received live vaccines.  -- Most common adverse reactions (incidence ?30%) include stomatitis, infections, rash, fatigue, diarrhea, edema, abdominal pain, nausea, fever, asthenia, cough, headache and decreased appetite.   --Adjunctive bone therapy. He is unlikely to be a candidate for bisphosphonate therapy given ESRDz and/or poor dentition. He is scheduled to see dentistry.    2. Hepatotoxicity secondary to Sutent, resolved.  --History of hepatitis C.  --He was counseled extensively to avoid tylenol and other acetaminophen containing products in the setting of his elevated liver enzymes.  I am using modified dose Afinitor so that liver functions do not get deranged and we can titrate dose up  later.  3. ESRDz on H/D.  --Continue hemodialysis per nephrology with epo.   4. Hypertension.  --Continue amlodipine 10 mg, losartan 25 mg daily.   5. Anemia in chronic kidney disease +/- IDA, resolved  -- Continue epo and RBC transfusion prn symptoms.   6. Pathological fracture of Left hip secondary to #1.  --Completed XRT on 08/21/2013. Continue pain control with oxycodone 5 mg q 6 hours (two tabs q 4 hours prn moderate pain) and tramadol 50 q 6 hours prn pain. May add long-acting if this is not enough.  Patient was given a refill prescription for his Flexeril.  7. Anorexia and decreased appetite. --He is supplementing his diet with ensure/boost.   8. Follow-up.  --He will follow up for a symptom check in 4 weeks and will have labs including CBC, chemistries,  All questions were answered. The patient knows to call the clinic with any problems, questions or concerns. We can certainly see the patient much sooner if necessary.  Patient and plan reviewed with Dr. Posey Rea.  Wynetta Emery, Raquell Richer E, PA-C

## 2014-07-31 ENCOUNTER — Other Ambulatory Visit: Payer: Self-pay | Admitting: Hematology

## 2014-07-31 ENCOUNTER — Telehealth: Payer: Self-pay | Admitting: Hematology

## 2014-07-31 NOTE — Telephone Encounter (Signed)
s.w. pt sister and advised on NOV appt...ok and aware °

## 2014-08-02 NOTE — Patient Instructions (Signed)
Continue Afinitor 7.5 mg by mouth daily Follow up in one month

## 2014-08-21 ENCOUNTER — Telehealth: Payer: Self-pay | Admitting: Hematology

## 2014-08-21 NOTE — Telephone Encounter (Signed)
Pt daughter called and r/s appt from 08/26/14 to 08/25/14 due to D[place]. on Tues and Thurs.

## 2014-08-25 ENCOUNTER — Telehealth: Payer: Self-pay | Admitting: Hematology

## 2014-08-25 ENCOUNTER — Ambulatory Visit (HOSPITAL_BASED_OUTPATIENT_CLINIC_OR_DEPARTMENT_OTHER): Payer: Medicare Other | Admitting: Hematology

## 2014-08-25 ENCOUNTER — Other Ambulatory Visit (HOSPITAL_BASED_OUTPATIENT_CLINIC_OR_DEPARTMENT_OTHER): Payer: Medicare Other

## 2014-08-25 ENCOUNTER — Other Ambulatory Visit: Payer: Self-pay

## 2014-08-25 VITALS — BP 113/69 | HR 92 | Temp 100.1°F | Resp 18 | Ht 67.0 in | Wt 125.2 lb

## 2014-08-25 DIAGNOSIS — N186 End stage renal disease: Secondary | ICD-10-CM

## 2014-08-25 DIAGNOSIS — C652 Malignant neoplasm of left renal pelvis: Secondary | ICD-10-CM

## 2014-08-25 DIAGNOSIS — M84559A Pathological fracture in neoplastic disease, hip, unspecified, initial encounter for fracture: Secondary | ICD-10-CM

## 2014-08-25 DIAGNOSIS — R630 Anorexia: Secondary | ICD-10-CM

## 2014-08-25 DIAGNOSIS — Z992 Dependence on renal dialysis: Secondary | ICD-10-CM

## 2014-08-25 DIAGNOSIS — C649 Malignant neoplasm of unspecified kidney, except renal pelvis: Secondary | ICD-10-CM

## 2014-08-25 DIAGNOSIS — C7951 Secondary malignant neoplasm of bone: Secondary | ICD-10-CM

## 2014-08-25 DIAGNOSIS — I10 Essential (primary) hypertension: Secondary | ICD-10-CM

## 2014-08-25 DIAGNOSIS — C642 Malignant neoplasm of left kidney, except renal pelvis: Secondary | ICD-10-CM

## 2014-08-25 LAB — CBC WITH DIFFERENTIAL/PLATELET
BASO%: 0.2 % (ref 0.0–2.0)
BASOS ABS: 0 10*3/uL (ref 0.0–0.1)
EOS%: 1.8 % (ref 0.0–7.0)
Eosinophils Absolute: 0.2 10*3/uL (ref 0.0–0.5)
HEMATOCRIT: 33.3 % — AB (ref 38.4–49.9)
HEMOGLOBIN: 11.1 g/dL — AB (ref 13.0–17.1)
LYMPH#: 1.5 10*3/uL (ref 0.9–3.3)
LYMPH%: 17 % (ref 14.0–49.0)
MCH: 26.1 pg — AB (ref 27.2–33.4)
MCHC: 33.3 g/dL (ref 32.0–36.0)
MCV: 78.2 fL — ABNORMAL LOW (ref 79.3–98.0)
MONO#: 0.9 10*3/uL (ref 0.1–0.9)
MONO%: 10.7 % (ref 0.0–14.0)
NEUT#: 6.1 10*3/uL (ref 1.5–6.5)
NEUT%: 70.3 % (ref 39.0–75.0)
Platelets: 246 10*3/uL (ref 140–400)
RBC: 4.26 10*6/uL (ref 4.20–5.82)
RDW: 16 % — ABNORMAL HIGH (ref 11.0–14.6)
WBC: 8.8 10*3/uL (ref 4.0–10.3)

## 2014-08-25 LAB — COMPREHENSIVE METABOLIC PANEL (CC13)
ALT: 14 U/L (ref 0–55)
AST: 19 U/L (ref 5–34)
Albumin: 3.1 g/dL — ABNORMAL LOW (ref 3.5–5.0)
Alkaline Phosphatase: 140 U/L (ref 40–150)
Anion Gap: 15 mEq/L — ABNORMAL HIGH (ref 3–11)
BUN: 6.9 mg/dL — AB (ref 7.0–26.0)
CALCIUM: 7.6 mg/dL — AB (ref 8.4–10.4)
CHLORIDE: 94 meq/L — AB (ref 98–109)
CO2: 31 mEq/L — ABNORMAL HIGH (ref 22–29)
Creatinine: 3.2 mg/dL (ref 0.7–1.3)
Glucose: 99 mg/dl (ref 70–140)
Potassium: 3.4 mEq/L — ABNORMAL LOW (ref 3.5–5.1)
Sodium: 140 mEq/L (ref 136–145)
Total Bilirubin: 0.42 mg/dL (ref 0.20–1.20)
Total Protein: 8 g/dL (ref 6.4–8.3)

## 2014-08-25 LAB — LACTATE DEHYDROGENASE (CC13): LDH: 284 U/L — AB (ref 125–245)

## 2014-08-25 MED ORDER — HYDROCODONE-ACETAMINOPHEN 5-325 MG PO TABS: 1.0000 | ORAL_TABLET | Freq: Two times a day (BID) | ORAL | Status: DC | PRN

## 2014-08-25 MED ORDER — PROCHLORPERAZINE MALEATE 10 MG PO TABS: ORAL_TABLET | ORAL | Status: DC

## 2014-08-25 NOTE — Progress Notes (Signed)
[facility] PROGRESS NOTE DATE OF SERVICE: 08/25/2014  Concha Norway, MD [place] [address]  DIAGNOSIS: Metastatic Renal Cell Carcinoma (RCC). Patient with ESRD on HD (T/T/S)  PRIOR THERAPY:  Sutent 50 mg daily on days 1-28, off on 29-42. Held as noted above and re-started on 11/22/2013. Discontinued on 06/11/2014 due to hepatotoxicity.  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  Seen 2 weeks before by PCP/Orthopedics with X-ray of hip and CXR revealing lytic sxpansile lesion with pathologic fracture, left inferior lesion.  Onc (Dr. Juliann Mule) consulted on 10/29; Rad onc (Dr. Lisbeth Renshaw) on 11/01   07/31/2013 Imaging CT C/A/P hyperemic 2.3 cm R paratracheal NL, smaller enlarged precarinal, subcarinal, pretracheal, adn prevascular LN, lesion in the proximal R 9th rib, smaller lytic lesion with path fracture of lateral R 6 th rib; 4.5 cm L kidney mass; 3 cm R kid mass   07/31/2013 Imaging MRI,  L hip on admission revealed a 6x6x5 cm homogeneous destructive soft tissue mass destroying the anterior medial and anterior superior aspects of the left acetabulum and lateral aspect of the left surperior pubic ramus.  2 cm mass in the post L5.   08/02/2013 Procedure CT Guide biopsy of left pelci soft tissue mass.    08/05/2013 Pathology Results Metastatic renal cell carcinoma, clear cell type.     08/05/2013 Cancer Staging Stage IV, RCC with metastases to the bone.   08/08/2013 - 08/21/2013 Radiation Therapy Treated to 3 distinct areas corresponding to the lumbar spine from approximately L1-L5, the left hip/pelvis, in the proximal right femur. Each of these separate target areas were treated with 2 fields each. The target areas each received 30 gray in 10 fra   08/24/2013 - 10/17/2013 Chemotherapy Started Sutent 50 mg  on Days 1-28, off days 29-42.     10/17/2013 Adverse Reaction Grade 4 elevated Transiminases with normal Bilirubin.  Sutent held until resolution.   11/17/2013 Imaging Re-staging CT C/A/P:Apparent interval response to therapy with decrease in mediastinal LAD and a lesion in the posterior right iliac bone.Other bony metastases and bilateral heterogeneously enhancing renallesions are not subtantial changed.   11/22/2013 -  Chemotherapy Sutent 50 mg on Days 1-28, off days 29-42 is restarted.   Liver enzymes normal. Referral to hepatology if increases again.  Consider afinitor as an alternative treatment.    02/28/2014 Imaging CAT scan CAP decrease in size of 2 renal lesions, 1 stable.decrease in osseous metastatic disease. Mediastinal LAD stable, Overall impresion stable disease to mild improvement   03/07/2014 - 06/11/2014 Chemotherapy Sutent was continued after that through now. Liver functions have been fine.   06/02/2014 Imaging CT scan CAP shows incraese in mediastinal adenopathy, left kidney lesion size incraese, new pulmonary nodules, progression seen in multi focal lytic bone metastasis (right rib lesion and L2 vertebra worse)   06/23/2014 -  Chemotherapy Afinitor started at 7.5 mg daily dose.    INTERVAL HISTORY: [name] 63 y.o. male with a history of Stage IV ccRCC with mets to bone is here for follow-up. He was last seen by Awilda Metro on 07/30/14. He was started on Afinitor at 7.5 mg by mouth daily on 06/23/2014. He has been tolerating this medication without difficulty. He reports continued improvement in pain to his right hip. He denies any fevers or chills or acute  shortness of breath.  He denies any yellowing of his eyes.   He denies abdominal pain, nausea/vomiting or rashes. He requests a refill of his muscle relaxer. This helps tremendously with the back pain and spasms he experiences during dialysis. His last CT scan was 06/02/2014. He denies any worsening in his pain specifically at sites  of skeletal mets.  Patient is experiencing mild diarrhea x 2 weeks (2-3 BM per day) and taking Lomotil with good results.   MEDICAL HISTORY: Past Medical History  Diagnosis Date  . Renal disorder   . Hypertension   . Dialysis patient   . Secondary hyperparathyroidism (of renal origin)     s/p parathyroidectomy  . Anemia due to chronic disease treated with erythropoietin   . Closed fracture of pubic ramus 08/2013    pathologic (metastatic renal cell ca)  . History of radiation therapy 08/08/13-08/21/13    L-1-L5, ,lt hip/pelvis,prox rt femur  . 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    ALLERGIES:  has No Known Allergies.  MEDICATIONS: has a current medication list which includes the following prescription(s): afinitor, alprazolam, calcium carbonate, cyclobenzaprine, diphenoxylate-atropine, dss, doxepin, ferrous sulfate, losartan, multivitamin, ranitidine, eucerin, tramadol, hydrocodone-acetaminophen, and prochlorperazine.  SURGICAL HISTORY:  Past Surgical History  Procedure Laterality Date  . Av fistula placement      REVIEW OF SYSTEMS:   Constitutional: Denies fevers, chills or abnormal weight loss; he reports a diminished appeitite.   Eyes: Denies blurriness of vision Ears, nose, mouth, throat, and face: Denies mucositis or sore throat Respiratory: Denies cough, dyspnea or wheezes Cardiovascular: Denies palpitation, chest discomfort or lower extremity swelling Gastrointestinal:  Denies nausea, heartburn or change in bowel habits Skin:  Denies abnormal skin rashes Lymphatics: Denies new lymphadenopathy or easy bruising Neurological:Denies numbness, tingling or new weaknesses Behavioral/Psych: Mood is stable, no new changes  All other systems were reviewed with the patient and are negative.  PHYSICAL EXAMINATION: ECOG PERFORMANCE STATUS: 2, KPS 70-80  Blood pressure 113/69, pulse 92, temperature 100.1 F (37.8 C), temperature source Oral, resp. rate 18, height 5\' 7"  (1.702 m), weight 125 lb 3.2 oz (56.79 kg).  GENERAL:alert, no distress and comfortable; chronically ill appearing thin male with dry skin. Alopecia. Ambulates with a walker.  SKIN: skin color, texture, turgor are normal, no rashes or significant lesions EYES: normal, Conjunctiva are pink and non-injected, sclera clear OROPHARYNX:no exudate, no erythema and lips, buccal mucosa, and tongue normal; Poor dentition.  NECK: supple, thyroid normal size, non-tender, without nodularity LYMPH:  no palpable lymphadenopathy in the cervical, axillary or supraclavicular LUNGS: clear to auscultation with normal breathing effort, no wheezes or rhonchi HEART: regular rate & rhythm and no murmurs and no lower extremity edema ABDOMEN:abdomen soft, non-tender and normal bowel sounds Musculoskeletal:no cyanosis of digits and no clubbing; R AV fistula.  NEURO: alert & oriented x 3 with fluent speech, no focal motor/sensory deficits  Labs:  CBC Latest Ref Rng 08/25/2014 07/30/2014 06/02/2014  WBC 4.0 - 10.3 10e3/uL 8.8 6.7 4.9  Hemoglobin 13.0 - 17.1 g/dL 11.1(L) 11.5(L) 11.7(L)  Hematocrit 38.4 - 49.9 % 33.3(L) 33.6(L) 34.0(L)  Platelets  140 - 400 10e3/uL 246 225 199   CMP     Component Value Date/Time   NA 140 08/25/2014 1501   NA 136 08/06/2013 1045   NA 136 08/06/2013 1045   K 3.4* 08/25/2014 1501   K 4.7 08/06/2013 1045   K 4.8 08/06/2013 1045   CL 93* 08/06/2013 1045   CL 93* 08/06/2013 1045   CO2 31* 08/25/2014 1501   CO2 25 08/06/2013 1045   CO2 25 08/06/2013  1045   GLUCOSE 99 08/25/2014 1501   GLUCOSE 127* 08/06/2013 1045   GLUCOSE 121* 08/06/2013 1045   BUN 6.9* 08/25/2014 1501   BUN 56* 08/06/2013 1045   BUN 57* 08/06/2013 1045   CREATININE 3.2* 08/25/2014 1501   CREATININE 8.52* 08/06/2013 1045   CREATININE 8.61* 08/06/2013 1045   CALCIUM 7.6* 08/25/2014 1501   CALCIUM 9.2 08/06/2013 1045   CALCIUM 9.2 08/06/2013 1045   PROT 8.0 08/25/2014 1501   PROT 7.7 12/20/2013 1034   ALBUMIN 3.1* 08/25/2014 1501   ALBUMIN 3.7 12/20/2013 1034   AST 19 08/25/2014 1501   AST 30 12/20/2013 1034   ALT 14 08/25/2014 1501   ALT 26 12/20/2013 1034   ALKPHOS 140 08/25/2014 1501   ALKPHOS 84 12/20/2013 1034   BILITOT 0.42 08/25/2014 1501   BILITOT 0.6 12/20/2013 1034   GFRNONAA 6* 08/06/2013 1045   GFRNONAA 6* 08/06/2013 1045   GFRAA 7* 08/06/2013 1045   GFRAA 7* 08/06/2013 1045    RADIOGRAPHIC STUDIES: 02/28/2014  CT CHEST, ABDOMEN, AND PELVIS WITH CONTRAST  TECHNIQUE: Multidetector CT imaging of the chest, abdomen and pelvis was performed following the standard protocol during bolus administration of intravenous contrast.  CONTRAST: 119mL OMNIPAQUE IOHEXOL 300 MG/ML SOLN COMPARISON: 11/20/2013 FINDINGS: CT CHEST FINDINGS Moderate paraseptal emphysematous changes. No suspicious pulmonary nodules. No pleural effusion or pneumothorax. Visualized thyroid is unremarkable. The heart is normal in size. No pericardial effusion. Small mediastinal nodes, similar, including: --9 mm short axis her paratracheal node (series 6/ image 23), unchanged  --15 mm short axis right paratracheal node (series 6/ image 34), previously 16 mm --6 mm short axis right azygoesophageal recess node (series 6/image 50), previously 8 mm 2.2 x 3.4 cm soft tissue lesion involving the right posterior medial 9th rib (series 6/image 62), previously 2.1 x 3.5 cm, with associated pathologic fracture. Healing fracture involving the right posterolateral 6th rib (series 6/image 42). Multiple  old/healed left posterior rib fractures. CT ABDOMEN AND PELVIS FINDINGS Liver, spleen, pancreas, and adrenal glands are within normal limits. Status post choecystectomy. No intrahepatic or extrahepatic ductal dilatation. Innumerable bilateral renal cysts of varying sizes and complexities. Three enhancing renal lesions, as follows: --3.2 x 2.8 cm enhancing mass is in the lateral right upper kidney (series 6/image 99), previously 3.2 x 2.8 cm, unchanged --9 x 11 mm enhancing lesion in the lateral left upper kidney (series 6/image 101), previously 14 x 12 mm, mildly decreased --2.2 x 3.6 cm lesion in the anterior interpolar left kidney (series 6/image 113), previously 3.9 x 4.5 cm, decreased No evidence of bowel obstruction. Atherosclerotic calcifications of the abdominal aorta and branch vessels. No abdominopelvic ascites. No suspicious abdominopelvic lymphadenopathy. Prostatomegaly, which enlargement of the central gland which indents the base of the bladder. Bladder is mildly thick-walled although underdistended. Destructive lesion with pathologic fracture involving the left superior pubic ramus measures approximately 2.1 x 3.6 cm (series 6/ image 24), previously 3.5 x 3.9 cm. Tumor involves the anterior/superior acetabulum with associated abnormal soft tissue along  the left hip joint (series 6/image 23) stable versus minimally decreased. 1.7 x 2.1 cm lytic lesion extending to the epidural space along the posterior aspect of the L5 vertebral body (series 6/image 144), previously 2.0 x 2.1 cm. Additional dominant destructive lesion at L2 with perispinal tumor along the left aspect of L3 (series 6/ image 120), similar. 2.4 x 2.0 cm soft tissue lesion along the posterior aspect of the right iliac bone (series 6/ image 148), previously 1.9 x 2.4 cm. Healing fractures involving the left inferior pubic ramus (series 6/ images 195 and 199). IMPRESSION: Three enhancing renal lesions, compatible with solid renal neoplasms as  described above, mildly decreased. Multifocal destructive osseous metastases, as described above, mildly decreased. Mild mediastinal lymphadenopathy, grossly unchanged. Pathologic fractures involving the right posterior 9th rib and left superior pubic ramus. Additional healing fractures, as above.  CT CHEST, ABDOMEN AND PELVIS WITH CONTRAST 06/02/2014 CLINICAL DATA: Metastatic renal cell carcinoma. EXAM: CT CHEST, ABDOMEN, AND PELVIS WITH CONTRAST  TECHNIQUE: Multidetector CT imaging of the chest, abdomen and pelvis was performed following the standard protocol during bolus administration of intravenous contrast. CONTRAST: 64mL OMNIPAQUE IOHEXOL 300 MG/ML SOLN COMPARISON: 02/28/2014  FINDINGS: CT CHEST FINDINGS The heart size is normal. There is no pericardial effusion. Calcified atherosclerotic disease involves the thoracic aorta. Enlarged right paratracheal lymph node measures 1.6 cm, image 21/series 2. Previously this measured the same. There is an enlarged right paratracheal node which measures 1 cm, image 16/series 2. Previously this measured 9 mm. Sub- carinal lymph node measures 9  mm, image 30/series 2. On the previous exam this measured 6 mm. There is no pericardial effusion. New subpleural nodule in the right midlung measures 5 mm, image 27/series 4. Within the right lower lobe there is a stable 3 mm nodule, image number 37/series 4. CT ABDOMEN AND PELVIS FINDINGS There is no suspicious liver abnormality. Previous cholecystectomy. No biliary dilatation. Normal appearance of the pancreas. The spleen is normal. The adrenal glands are both normal. Changes of polycystic kidney disease identified. There is a enhancing mass within the anterior aspect of the left kidney 3.8 x 3.9 cm, image 67/series 2. Previously this measured 3.2 x 3.6 cm. The urinary bladder appears  collapsed. There is calcified atherosclerotic disease involving the abdominal aorta. No aneurysm. There is no adenopathy identified within  the abdomen or pelvis. No free fluid or fluid collections identified within the abdomen or the pelvis. The stomach is normal. The small bowel loops have a normal course and caliber without evidence for bowel obstruction. Normal appearance of the colon. Review of the visualized osseous structures is again significant for multifocal lytic bone metastasis. Right ninth rib lesion measured  2.7 x 3.9 cm, image 35/ series 2. This is compared with (when remeasured) 2.0 x 3.0 cm previously. Destructive lesion involving the L2 vertebra with associated pathologic compression fracture is again noted. There is a further loss of bone and vertebral body height at this level. There is a large lytic lesion involving the posterior aspect of the L5 vertebral body. This measures 1.7 cm, image 60/ series 603. This is similar to previous exam and may be of  impending orthopedic significance. Destructive lesion involving the left superior acetabulum and superior pubic rami is again noted with associated pathologic fractures. This is similar to the previous exam.  IMPRESSION: 1. The mediastinal adenopathy demonstrates mild increase in size in the interval.  2. Interval increase in size of left kidney lesion. 3. Small pulmonary nodules 1 of which  is new from previous exam.  4. Mild progression of multi focal lytic bone metastasis. Specifically, there has been increase in size of right rib lesion  and further destruction of the L2 vertebra. Thin Electronically Signed By: Kerby Moors M.D. On: 06/02/2014 15:56  ASSESSMENT: [place] [name] 63 y.o. male with a history of metastatic RCC, refractory to Sutent on Modified-Afinitor therapy for better tolerance.   PLAN:  1. 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 Plan  Everolimus (AFINITOR) as second line therapy for metastatic RCC.  It is approved by FDA in adults with advanced renal cell carcinoma (RCC) after failure of treatment with sunitinib or  sorafenib. -- The usual dose is 10 mg daily but based on his renal status and past shock liver picture with Sutent,Continue on 7.5 mg daily dose (started 06/23/2014). The warnings and precautions with this drug include following:  Non-infectious pneumonitis: we monitor for clinical symptoms or radiological changes; fatal cases have occurred. Manage by dose reduction or discontinuation until symptoms resolve, and consider use of corticosteroids.   . Infections: Increased risk of infections, some fatal. Monitor for signs and symptoms, and treat promptly.  . Angioedema: Patients taking concomitant ACE inhibitor therapy may be at increased risk for angioedema.  . Oral ulceration: Mouth ulcers, stomatitis, and oral mucositis are common. Management includes mouthwashes and topical treatments.  . Renal failure: Cases of renal failure (including acute renal failure), some with a fatal outcome, have been observed. Our patient is already on hemodialysis. . Impaired wound healing: Increased risk of wound-related complications. Monitor signs and symptoms. Exercise caution in the peri-surgical period.  . Laboratory test alterations: Elevations of serum creatinine, urinary protein, blood glucose, and lipids may occur. Decreases in hemoglobin, neutrophils, and platelets may also occur. Monitor renal function, blood glucose, lipids, and hematologic parameters prior to treatment and periodically thereafter.  . Vaccinations: Avoid live vaccines and close contact with those who have received live vaccines.  -- Most common adverse reactions (incidence ?30%) include stomatitis, infections, rash, fatigue, diarrhea, edema, abdominal pain, nausea, fever, asthenia, cough, headache and decreased appetite.   --Adjunctive bone therapy. He is unlikely to be a candidate for bisphosphonate therapy given ESRDz and/or poor dentition. He is scheduled to see dentistry.    2. Hepatotoxicity secondary to Sutent, resolved.  --History of  hepatitis C.  --He was counseled extensively to avoid tylenol and other acetaminophen containing products in the setting of his elevated liver enzymes.  I am using modified dose Afinitor so that liver functions do not get deranged and we can titrate dose up later.  3. ESRDz on H/D.  --Continue hemodialysis per nephrology with epo (T/T/S).  4. Hypertension.  --Continue amlodipine 10 mg, losartan 25 mg daily.   5. Anemia in chronic kidney disease +/- IDA, resolved  -- Continue epo and RBC transfusion prn symptoms.   6. Pathological fracture of Left hip secondary to #1.  --Completed XRT on 08/21/2013. Continue pain control with oxycodone 5 mg q 6 hours (two tabs q 4 hours prn moderate pain) and tramadol 50 q 6 hours prn pain. May add long-acting if this is not enough.  Patient was given a refill prescription for his Flexeril.  7. Anorexia and decreased appetite. --He is supplementing his diet with ensure/boost.   8. Follow-up.  --He will follow up for a symptom check in January after he gets a restaging scan on October 06 2014. He will see Dr [name] upon return.  All questions were answered.  The patient knows to call the clinic with any problems, questions or concerns. We can certainly see the patient much sooner if necessary.   Bernadene Bell, MD Medical Hematologist/Oncologist [place] Pager: [phone] Office No: [phone]

## 2014-08-25 NOTE — Telephone Encounter (Signed)
gv and printed appt sched and avs for pt for Jan 2016...gv pt barium

## 2014-08-26 ENCOUNTER — Other Ambulatory Visit: Payer: Medicare Other

## 2014-08-26 ENCOUNTER — Ambulatory Visit: Payer: Medicare Other

## 2014-09-04 ENCOUNTER — Telehealth: Payer: Self-pay | Admitting: *Deleted

## 2014-09-04 NOTE — Telephone Encounter (Signed)
SPOKE WITH PT. HE IS HAVING TWO DIARRHEA STOOLS A DAY. PT. IS TAKING LOMOTIL WITH GOOD RESULTS. TRIED TO CONTACT PT.'S SISTER TO HAVE HER INPUT BUT WAS UNABLE TO LEAVE A MESSAGE AT THE HOME OR ON HER MOBILE.

## 2014-09-22 ENCOUNTER — Other Ambulatory Visit: Payer: Self-pay | Admitting: *Deleted

## 2014-09-22 DIAGNOSIS — C649 Malignant neoplasm of unspecified kidney, except renal pelvis: Secondary | ICD-10-CM

## 2014-09-22 DIAGNOSIS — C7951 Secondary malignant neoplasm of bone: Principal | ICD-10-CM

## 2014-09-22 DIAGNOSIS — N186 End stage renal disease: Secondary | ICD-10-CM

## 2014-09-22 DIAGNOSIS — Z992 Dependence on renal dialysis: Secondary | ICD-10-CM

## 2014-09-22 MED ORDER — CYCLOBENZAPRINE HCL 10 MG PO TABS: ORAL_TABLET | ORAL | Status: DC

## 2014-10-06 ENCOUNTER — Telehealth: Payer: Self-pay | Admitting: Oncology

## 2014-10-06 ENCOUNTER — Encounter (HOSPITAL_COMMUNITY): Payer: Self-pay

## 2014-10-06 ENCOUNTER — Ambulatory Visit (HOSPITAL_COMMUNITY)
Admission: RE | Admit: 2014-10-06 | Discharge: 2014-10-06 | Disposition: A | Discharge status: Home / Self Care | Payer: Medicare Other | Source: Ambulatory Visit | Attending: Hematology | Admitting: Hematology

## 2014-10-06 DIAGNOSIS — M25452 Effusion, left hip: Secondary | ICD-10-CM | POA: Insufficient documentation

## 2014-10-06 DIAGNOSIS — N281 Cyst of kidney, acquired: Secondary | ICD-10-CM | POA: Insufficient documentation

## 2014-10-06 DIAGNOSIS — N400 Enlarged prostate without lower urinary tract symptoms: Secondary | ICD-10-CM | POA: Diagnosis not present

## 2014-10-06 DIAGNOSIS — R590 Localized enlarged lymph nodes: Secondary | ICD-10-CM | POA: Insufficient documentation

## 2014-10-06 DIAGNOSIS — I7090 Unspecified atherosclerosis: Secondary | ICD-10-CM | POA: Insufficient documentation

## 2014-10-06 DIAGNOSIS — N261 Atrophy of kidney (terminal): Secondary | ICD-10-CM | POA: Diagnosis not present

## 2014-10-06 DIAGNOSIS — C649 Malignant neoplasm of unspecified kidney, except renal pelvis: Secondary | ICD-10-CM | POA: Insufficient documentation

## 2014-10-06 DIAGNOSIS — N2889 Other specified disorders of kidney and ureter: Secondary | ICD-10-CM | POA: Diagnosis not present

## 2014-10-06 DIAGNOSIS — M25552 Pain in left hip: Secondary | ICD-10-CM | POA: Insufficient documentation

## 2014-10-06 DIAGNOSIS — R911 Solitary pulmonary nodule: Secondary | ICD-10-CM | POA: Diagnosis not present

## 2014-10-06 DIAGNOSIS — N186 End stage renal disease: Secondary | ICD-10-CM | POA: Insufficient documentation

## 2014-10-06 DIAGNOSIS — M4856XS Collapsed vertebra, not elsewhere classified, lumbar region, sequela of fracture: Secondary | ICD-10-CM | POA: Diagnosis not present

## 2014-10-06 DIAGNOSIS — J439 Emphysema, unspecified: Secondary | ICD-10-CM | POA: Insufficient documentation

## 2014-10-06 DIAGNOSIS — Z992 Dependence on renal dialysis: Secondary | ICD-10-CM | POA: Insufficient documentation

## 2014-10-06 DIAGNOSIS — R05 Cough: Secondary | ICD-10-CM | POA: Diagnosis not present

## 2014-10-06 DIAGNOSIS — M6250 Muscle wasting and atrophy, not elsewhere classified, unspecified site: Secondary | ICD-10-CM | POA: Diagnosis not present

## 2014-10-06 DIAGNOSIS — K4190 Unilateral femoral hernia, without obstruction or gangrene, not specified as recurrent: Secondary | ICD-10-CM | POA: Insufficient documentation

## 2014-10-06 DIAGNOSIS — C7951 Secondary malignant neoplasm of bone: Secondary | ICD-10-CM | POA: Insufficient documentation

## 2014-10-06 DIAGNOSIS — I120 Hypertensive chronic kidney disease with stage 5 chronic kidney disease or end stage renal disease: Secondary | ICD-10-CM | POA: Diagnosis not present

## 2014-10-06 DIAGNOSIS — M84550D Pathological fracture in neoplastic disease, pelvis, subsequent encounter for fracture with routine healing: Secondary | ICD-10-CM | POA: Diagnosis not present

## 2014-10-06 DIAGNOSIS — J9811 Atelectasis: Secondary | ICD-10-CM | POA: Diagnosis not present

## 2014-10-06 DIAGNOSIS — C642 Malignant neoplasm of left kidney, except renal pelvis: Secondary | ICD-10-CM

## 2014-10-06 NOTE — Telephone Encounter (Signed)
pt needed to r/s appt...done...pt aweare of new d.t

## 2014-10-08 ENCOUNTER — Other Ambulatory Visit: Payer: Medicare Other

## 2014-10-08 ENCOUNTER — Ambulatory Visit: Payer: Medicare Other | Admitting: Oncology

## 2014-10-15 ENCOUNTER — Telehealth: Payer: Self-pay | Admitting: *Deleted

## 2014-10-15 ENCOUNTER — Other Ambulatory Visit: Payer: Self-pay | Admitting: *Deleted

## 2014-10-15 DIAGNOSIS — C7951 Secondary malignant neoplasm of bone: Principal | ICD-10-CM

## 2014-10-15 DIAGNOSIS — Z992 Dependence on renal dialysis: Secondary | ICD-10-CM

## 2014-10-15 DIAGNOSIS — N186 End stage renal disease: Secondary | ICD-10-CM

## 2014-10-15 DIAGNOSIS — C649 Malignant neoplasm of unspecified kidney, except renal pelvis: Secondary | ICD-10-CM

## 2014-10-15 MED ORDER — HYDROCODONE-ACETAMINOPHEN 5-325 MG PO TABS: 1.0000 | ORAL_TABLET | Freq: Two times a day (BID) | ORAL | Status: DC | PRN

## 2014-10-15 NOTE — Telephone Encounter (Signed)
Script for hydrocodone left at front for patient p/u. Sister will p/u

## 2014-10-15 NOTE — Telephone Encounter (Signed)
Received call from daughter requesting refill of Hydrocodone.  Message to [name]. [facility]  Phone   [phone].

## 2014-10-23 ENCOUNTER — Other Ambulatory Visit: Payer: Self-pay | Admitting: *Deleted

## 2014-10-23 DIAGNOSIS — N189 Chronic kidney disease, unspecified: Secondary | ICD-10-CM

## 2014-10-23 DIAGNOSIS — C649 Malignant neoplasm of unspecified kidney, except renal pelvis: Secondary | ICD-10-CM

## 2014-10-23 DIAGNOSIS — Z992 Dependence on renal dialysis: Secondary | ICD-10-CM

## 2014-10-23 DIAGNOSIS — D631 Anemia in chronic kidney disease: Secondary | ICD-10-CM

## 2014-10-23 DIAGNOSIS — E875 Hyperkalemia: Secondary | ICD-10-CM

## 2014-10-23 DIAGNOSIS — C7951 Secondary malignant neoplasm of bone: Principal | ICD-10-CM

## 2014-10-24 ENCOUNTER — Other Ambulatory Visit: Payer: Self-pay | Admitting: *Deleted

## 2014-10-24 ENCOUNTER — Ambulatory Visit: Payer: Medicare Other | Admitting: Oncology

## 2014-10-24 ENCOUNTER — Telehealth: Payer: Self-pay | Admitting: Oncology

## 2014-10-24 ENCOUNTER — Other Ambulatory Visit: Payer: Medicare Other

## 2014-10-24 NOTE — Telephone Encounter (Signed)
Confirm appointment rescheduled from 1/22 to 1/27.

## 2014-10-29 ENCOUNTER — Telehealth: Payer: Self-pay | Admitting: Oncology

## 2014-10-29 ENCOUNTER — Other Ambulatory Visit: Payer: Medicare Other

## 2014-10-29 ENCOUNTER — Ambulatory Visit: Payer: Medicare Other | Admitting: Oncology

## 2014-10-29 NOTE — Telephone Encounter (Signed)
Confirm appointment reschedule missed appointment from 1/27 to 03/09. Mailed calendar

## 2014-11-14 ENCOUNTER — Other Ambulatory Visit: Payer: Self-pay | Admitting: Hematology

## 2014-11-14 DIAGNOSIS — C649 Malignant neoplasm of unspecified kidney, except renal pelvis: Secondary | ICD-10-CM

## 2014-11-14 DIAGNOSIS — N186 End stage renal disease: Secondary | ICD-10-CM

## 2014-11-14 DIAGNOSIS — C7951 Secondary malignant neoplasm of bone: Principal | ICD-10-CM

## 2014-11-14 DIAGNOSIS — Z992 Dependence on renal dialysis: Secondary | ICD-10-CM

## 2014-11-14 MED ORDER — CYCLOBENZAPRINE HCL 10 MG PO TABS: ORAL_TABLET | ORAL | Status: DC

## 2014-11-20 ENCOUNTER — Other Ambulatory Visit: Payer: Self-pay | Admitting: *Deleted

## 2014-11-20 DIAGNOSIS — N186 End stage renal disease: Secondary | ICD-10-CM

## 2014-11-20 DIAGNOSIS — Z992 Dependence on renal dialysis: Secondary | ICD-10-CM

## 2014-11-20 DIAGNOSIS — C649 Malignant neoplasm of unspecified kidney, except renal pelvis: Secondary | ICD-10-CM

## 2014-11-20 DIAGNOSIS — C7951 Secondary malignant neoplasm of bone: Principal | ICD-10-CM

## 2014-11-20 MED ORDER — HYDROCODONE-ACETAMINOPHEN 5-325 MG PO TABS: 1.0000 | ORAL_TABLET | Freq: Two times a day (BID) | ORAL | Status: DC | PRN

## 2014-11-20 MED ORDER — ALPRAZOLAM 0.25 MG PO TABS: 0.2500 mg | ORAL_TABLET | Freq: Two times a day (BID) | ORAL | Status: DC | PRN

## 2014-11-20 NOTE — Telephone Encounter (Signed)
Spoke with Kandice Moos, sister of patient, that his prescriptions are ready for pickup. She verbalized understanding.

## 2014-12-10 ENCOUNTER — Telehealth: Payer: Self-pay | Admitting: Oncology

## 2014-12-10 ENCOUNTER — Other Ambulatory Visit (HOSPITAL_BASED_OUTPATIENT_CLINIC_OR_DEPARTMENT_OTHER): Payer: Medicare Other

## 2014-12-10 ENCOUNTER — Ambulatory Visit (HOSPITAL_BASED_OUTPATIENT_CLINIC_OR_DEPARTMENT_OTHER): Payer: Medicare Other | Admitting: Oncology

## 2014-12-10 VITALS — BP 109/70 | HR 90 | Temp 98.7°F | Resp 18 | Ht 67.0 in | Wt 132.2 lb

## 2014-12-10 DIAGNOSIS — D631 Anemia in chronic kidney disease: Secondary | ICD-10-CM

## 2014-12-10 DIAGNOSIS — C649 Malignant neoplasm of unspecified kidney, except renal pelvis: Secondary | ICD-10-CM

## 2014-12-10 DIAGNOSIS — N186 End stage renal disease: Secondary | ICD-10-CM

## 2014-12-10 DIAGNOSIS — Z992 Dependence on renal dialysis: Secondary | ICD-10-CM

## 2014-12-10 DIAGNOSIS — C7951 Secondary malignant neoplasm of bone: Secondary | ICD-10-CM

## 2014-12-10 DIAGNOSIS — E875 Hyperkalemia: Secondary | ICD-10-CM

## 2014-12-10 DIAGNOSIS — I10 Essential (primary) hypertension: Secondary | ICD-10-CM

## 2014-12-10 DIAGNOSIS — N189 Chronic kidney disease, unspecified: Secondary | ICD-10-CM

## 2014-12-10 LAB — COMPREHENSIVE METABOLIC PANEL (CC13)
ALBUMIN: 3.2 g/dL — AB (ref 3.5–5.0)
ALT: 30 U/L (ref 0–55)
AST: 25 U/L (ref 5–34)
Alkaline Phosphatase: 124 U/L (ref 40–150)
Anion Gap: 16 mEq/L — ABNORMAL HIGH (ref 3–11)
BILIRUBIN TOTAL: 0.34 mg/dL (ref 0.20–1.20)
BUN: 40.9 mg/dL — AB (ref 7.0–26.0)
CALCIUM: 8.9 mg/dL (ref 8.4–10.4)
CO2: 28 mEq/L (ref 22–29)
Chloride: 100 mEq/L (ref 98–109)
Creatinine: 7.3 mg/dL (ref 0.7–1.3)
EGFR: 8 mL/min/{1.73_m2} — AB (ref >90)
GLUCOSE: 75 mg/dL (ref 70–140)
Potassium: 5.2 mEq/L — ABNORMAL HIGH (ref 3.5–5.1)
Sodium: 143 mEq/L (ref 136–145)
Total Protein: 8.4 g/dL — ABNORMAL HIGH (ref 6.4–8.3)

## 2014-12-10 LAB — CBC WITH DIFFERENTIAL/PLATELET
BASO%: 0.6 % (ref 0.0–2.0)
Basophils Absolute: 0 10*3/uL (ref 0.0–0.1)
EOS%: 3 % (ref 0.0–7.0)
Eosinophils Absolute: 0.2 10*3/uL (ref 0.0–0.5)
HCT: 41.4 % (ref 38.4–49.9)
HEMOGLOBIN: 13.9 g/dL (ref 13.0–17.1)
LYMPH#: 1.7 10*3/uL (ref 0.9–3.3)
LYMPH%: 24.5 % (ref 14.0–49.0)
MCH: 24.9 pg — ABNORMAL LOW (ref 27.2–33.4)
MCHC: 33.6 g/dL (ref 32.0–36.0)
MCV: 74.1 fL — ABNORMAL LOW (ref 79.3–98.0)
MONO#: 1 10*3/uL — ABNORMAL HIGH (ref 0.1–0.9)
MONO%: 15 % — ABNORMAL HIGH (ref 0.0–14.0)
NEUT#: 3.8 10*3/uL (ref 1.5–6.5)
NEUT%: 56.9 % (ref 39.0–75.0)
Platelets: 189 10*3/uL (ref 140–400)
RBC: 5.59 10*6/uL (ref 4.20–5.82)
RDW: 16.9 % — ABNORMAL HIGH (ref 11.0–14.6)
WBC: 6.7 10*3/uL (ref 4.0–10.3)

## 2014-12-10 MED ORDER — HYDROCODONE-ACETAMINOPHEN 5-325 MG PO TABS: 1.0000 | ORAL_TABLET | Freq: Two times a day (BID) | ORAL | Status: DC | PRN

## 2014-12-10 NOTE — Progress Notes (Signed)
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  PRIOR THERAPY:  Sutent 50 mg daily on days 1-28, off on 29-42. Held as noted above and re-started on 11/22/2013. Discontinued on 06/11/2014 due to hepatotoxicity.  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  Seen 2 weeks before by PCP/Orthopedics with X-ray of hip and CXR revealing lytic sxpansile lesion with pathologic fracture, left inferior lesion.  Onc (Dr. Juliann Mule) consulted on 10/29; Rad onc (Dr. Lisbeth Renshaw) on 11/01   07/31/2013 Imaging CT C/A/P hyperemic 2.3 cm R paratracheal NL, smaller enlarged precarinal, subcarinal, pretracheal, adn prevascular LN, lesion in the proximal R 9th rib, smaller lytic lesion with path fracture of lateral R 6 th rib; 4.5 cm L kidney mass; 3 cm R kid mass   07/31/2013 Imaging MRI,  L hip on admission revealed a 6x6x5 cm homogeneous destructive soft tissue mass destroying the anterior medial and anterior superior aspects of the left acetabulum and lateral aspect of the left surperior pubic ramus.  2 cm mass in the post L5.   08/02/2013 Procedure CT Guide biopsy of left pelci soft tissue mass.    08/05/2013 Pathology Results Metastatic renal cell carcinoma, clear cell type.     08/05/2013 Cancer Staging Stage IV, RCC with metastases to the bone.   08/08/2013 - 08/21/2013 Radiation Therapy Treated to 3 distinct areas corresponding to the lumbar spine from approximately L1-L5, the left hip/pelvis, in the proximal right femur. Each of these separate target areas were treated with 2 fields each. The target areas each received 30 gray in 10 fra   08/24/2013 - 10/17/2013 Chemotherapy Started Sutent 50 mg on Days 1-28, off days 29-42.     10/17/2013 Adverse  Reaction Grade 4 elevated Transiminases with normal Bilirubin.  Sutent held until resolution.   11/17/2013 Imaging Re-staging CT C/A/P:Apparent interval response to therapy with decrease in mediastinal LAD and a lesion in the posterior right iliac bone.Other bony metastases and bilateral heterogeneously enhancing renallesions are not subtantial changed.   11/22/2013 -  Chemotherapy Sutent 50 mg on Days 1-28, off days 29-42 is restarted.   Liver enzymes normal. Referral to hepatology if increases again.  Consider afinitor as an alternative treatment.    02/28/2014 Imaging CAT scan CAP decrease in size of 2 renal lesions, 1 stable.decrease in osseous metastatic disease. Mediastinal LAD stable, Overall impresion stable disease to mild improvement   03/07/2014 - 06/11/2014 Chemotherapy Sutent was continued after that through now. Liver functions have been fine.   06/02/2014 Imaging CT scan CAP shows incraese in mediastinal adenopathy, left kidney lesion size incraese, new pulmonary nodules, progression seen in multi focal lytic bone metastasis (right rib lesion and L2 vertebra worse)   06/23/2014 -  Chemotherapy Afinitor started at 7.5 mg daily dose.    INTERVAL HISTORY: [name] 64 y.o. male  returns today  for follow-up. Since the last visit, he has not reported any major changes in his clinical status. He has been tolerating this medication without difficulty. He reports continued improvement in pain to his right hip. He denies any fevers or chills or acute shortness of breath.  He denies any yellowing of his eyes.  He does use a walker for mobility and has not reported any syncope or falls.  He denies abdominal pain, nausea/vomiting or rashes. He does not report any headaches or blurry vision or seizures. He does not report any chest pain, palpitation orthopnea. He does not report any cough or wheezing or difficulty breathing. He does not report any nausea, vomiting, abdominal pain. He is not report any change  in his bowel habits. He does not report any frequency urgency or hesitancy. He does not report any skeletal complaints. Rest of his review of systems unremarkable.  MEDICAL HISTORY: Past Medical History  Diagnosis Date  . Renal disorder   . Hypertension   . Dialysis patient   . Secondary hyperparathyroidism (of renal origin)     s/p parathyroidectomy  . Anemia due to chronic disease treated with erythropoietin   . Closed fracture of pubic ramus 08/2013    pathologic (metastatic renal cell ca)  . History of radiation therapy 08/08/13-08/21/13    L-1-L5, ,lt hip/pelvis,prox rt femur  . 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 Renal insufficiency     on dialysis x 10 yrs     MEDICATIONS: has a current medication list which includes the following prescription(s): afinitor, alprazolam, calcium carbonate, cyclobenzaprine, diphenoxylate-atropine, dss, doxepin, ferrous sulfate, hydrocodone-acetaminophen, losartan, multivitamin, prochlorperazine, ranitidine, eucerin, and tramadol.  SURGICAL HISTORY:  Past Surgical History  Procedure Laterality Date  . Av fistula placement      PHYSICAL EXAMINATION: ECOG PERFORMANCE STATUS: 2.   Blood pressure 109/70, pulse 90, temperature 98.7 F (37.1 C), temperature source Oral, resp. rate 18, height 5\' 7"  (1.702 m), weight 132 lb 3.2 oz (59.966 kg).  GENERAL:alert, no distress and comfortable; chronically ill appearing thin male. SKIN: skin color, texture, turgor are normal, no rashes or significant lesions EYES: normal, Conjunctiva are pink and non-injected, sclera clear OROPHARYNX:no exudate, no erythema and lips, buccal mucosa, and tongue normal; Poor dentition.  NECK: supple, thyroid normal size, non-tender, without nodularity LYMPH:  no palpable lymphadenopathy in the cervical, axillary or supraclavicular LUNGS: clear to auscultation with normal breathing effort, no wheezes or rhonchi HEART: regular rate & rhythm and no murmurs and  no lower extremity edema ABDOMEN:abdomen soft, non-tender and normal bowel sounds Musculoskeletal:no cyanosis of digits and no clubbing; R AV fistula.  NEURO: alert & oriented x 3 with fluent speech.  Labs:  CBC Latest Ref Rng 12/10/2014 08/25/2014 07/30/2014  WBC 4.0 - 10.3 10e3/uL 6.7 8.8 6.7  Hemoglobin 13.0 - 17.1 g/dL 13.9 11.1(L) 11.5(L)  Hematocrit 38.4 - 49.9 % 41.4 33.3(L) 33.6(L)  Platelets 140 - 400 10e3/uL 189 246 225   CMP     Component Value Date/Time   NA 140 08/25/2014 1501   NA 136 08/06/2013 1045   NA 136 08/06/2013 1045   K 3.4* 08/25/2014 1501   K 4.7 08/06/2013 1045   K 4.8 08/06/2013 1045   CL 93* 08/06/2013 1045   CL 93* 08/06/2013 1045   CO2 31* 08/25/2014 1501   CO2 25 08/06/2013 1045   CO2 25 08/06/2013 1045   GLUCOSE 99 08/25/2014 1501   GLUCOSE 127* 08/06/2013 1045   GLUCOSE 121* 08/06/2013 1045   BUN 6.9* 08/25/2014 1501   BUN 56* 08/06/2013 1045   BUN 57* 08/06/2013 1045   CREATININE 3.2* 08/25/2014 1501   CREATININE 8.52* 08/06/2013 1045   CREATININE 8.61* 08/06/2013 1045   CALCIUM 7.6* 08/25/2014 1501   CALCIUM 9.2 08/06/2013 1045   CALCIUM 9.2 08/06/2013 1045   PROT 8.0 08/25/2014 1501   PROT 7.7 12/20/2013 1034   ALBUMIN 3.1* 08/25/2014  1501   ALBUMIN 3.7 12/20/2013 1034   AST 19 08/25/2014 1501   AST 30 12/20/2013 1034   ALT 14 08/25/2014 1501   ALT 26 12/20/2013 1034   ALKPHOS 140 08/25/2014 1501   ALKPHOS 84 12/20/2013 1034   BILITOT 0.42 08/25/2014 1501   BILITOT 0.6 12/20/2013 1034   GFRNONAA 6* 08/06/2013 1045   GFRNONAA 6* 08/06/2013 1045   GFRAA 7* 08/06/2013 1045   GFRAA 7* 08/06/2013 1045    EXAM: CT CHEST, ABDOMEN AND PELVIS WITHOUT CONTRAST  TECHNIQUE: Multidetector CT imaging of the chest, abdomen and pelvis was performed following the standard protocol without IV contrast. Sagittal and coronal MPR images reconstructed from axial data set.  COMPARISON: 06/02/2014  FINDINGS: CT CHEST FINDINGS  9 mm  short axis RIGHT peritracheal lymph node image 18, previously 10 mm.  18 mm precarinal lymph node image 23 previously 16 mm.  No new thoracic adenopathy.  Mild emphysematous and scattered bronchitic changes again seen.  5 mm nodule at the RIGHT major fissure image 29 unchanged.  Scattered atelectasis RIGHT lower lobe unchanged.  No additional mass, nodule, infiltrate, or pleural effusion.  No pericardial effusion.  Destructive lesion posterior RIGHT ninth rib again identified, slightly increased in size at 4.5 x 2.9 cm previously 3.9 x 2.7 cm, demonstrating reactive sclerosis within the posterolateral aspect of the adjacent vertebra, unchanged.  Old appearing BILATERAL rib fractures identified.  Sclerotic focus posterior LEFT third rib at site of healing fracture again noted.  CT ABDOMEN AND PELVIS FINDINGS  Atrophic kidneys bilaterally with numerous BILATERAL renal cysts.  Again identified mass anterior mid LEFT kidney 3.7 x 4.1 cm image 70, previously 3.9 x 3.8 cm.  Liver, spleen, pancreas, and adrenal glands normal.  Scattered atherosclerotic calcifications.  Gallbladder surgically absent.  Stomach incompletely distended, unable to exclude wall thickening.  Bladder decompressed, unable to assess.  Mild prostatic enlargement, gland 4.9 x 3.8 cm image 112.  RIGHT femoral hernia containing fat.  Scattered muscular atrophy.  Segment of questionable wall thickening versus artifact from underdistention at distal transverse colon, approximately 2.7 cm length.  Remaining bowel loops unremarkable.  No mass, adenopathy, free air or ascites.  Destructive metastatic lesion involving the LEFT superior pubic ramus into roof and anterior column LEFT acetabulum associated with LEFT hip joint effusion.  Destruction at superior aspect of the LEFT femoral head question related to metastasis or avascular necrosis.  Healing fractures of the  LEFT inferior pubic ramus.  Lytic metastasis proximal RIGHT femur the level lesser trochanter, 17 mm diameter again seen.  Lytic lesion posterior RIGHT ilium 2.9 x 1.8 cm image 90 previously 2.7 x 1.8.  Lytic lesion posterior aspect L5 vertebral body 2.0 cm height unchanged.  Compression fracture with mixed lytic and sclerotic process L2 vertebra unchanged.  Destruction superior aspect of L3 vertebra in irregularity of the endplates adjacent to the L4-L5 disc may be degenerative in origin, not significantly changed.  Questionable small RIGHT hip joint effusion.  IMPRESSION: Slight increase in size of metastatic lesion at posterior RIGHT ninth rib.  Multiple additional osseous metastases appear grossly stable; this includes a lytic lesion at the proximal RIGHT femur 1.7 cm diameter, at risk for pathologic fracture.  Stable RIGHT pulmonary nodule.  Slight increase in size of precarinal lymph node now 18 mm short axis previously 16 mm.  Stable LEFT renal mass.  Prostatic enlargement.  RIGHT femoral hernia containing fat.      ASSESSMENT: [name] 64 y.o. male with:  PLAN:  1. 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  He is currently on Afinitor and have tolerated it well. CT scan on 10/06/2014 was reviewed and for the most part showed stable disease. We'll continue to follow him closely and repeat imaging studies in July 2016. If he does have progression of disease, other options will be used at that time.  2. Hepatotoxicity secondary to Sutent, resolved.  Liver function tests will be repeated today.  3. ESRDz on H/D.  Continue hemodialysis per nephrology with epo (T/T/S).  4. Hypertension.  Not exacerbated by his current anticancer medication.  5. Anemia in chronic kidney disease +/- IDA, resolved  Continue epo and RBC transfusion prn symptoms.   6. Pathological fracture of Left hip secondary to  #1.  Completed XRT on 08/21/2013. On hydrocodone at this time.  7. Anorexia and decreased appetite. He is supplementing his diet with ensure/boost.   8. Follow-up.  Months for a clinical checkup and at that time he will need to have a CT scan scheduled in July 2016 and and M.D. follow-up.  [facility] MD 12/10/2014

## 2014-12-10 NOTE — Telephone Encounter (Signed)
gv and printed appt sched anda vs for pt for may °

## 2014-12-26 ENCOUNTER — Telehealth: Payer: Self-pay | Admitting: Oncology

## 2014-12-26 NOTE — Telephone Encounter (Signed)
Due to AJ PAL moved 5/4 appointments to 5/11. Left message for patient and mailed new schedule.

## 2015-01-26 ENCOUNTER — Other Ambulatory Visit: Payer: Self-pay | Admitting: *Deleted

## 2015-01-26 DIAGNOSIS — C649 Malignant neoplasm of unspecified kidney, except renal pelvis: Secondary | ICD-10-CM

## 2015-01-26 DIAGNOSIS — Z992 Dependence on renal dialysis: Secondary | ICD-10-CM

## 2015-01-26 DIAGNOSIS — N186 End stage renal disease: Secondary | ICD-10-CM

## 2015-01-26 DIAGNOSIS — C7951 Secondary malignant neoplasm of bone: Principal | ICD-10-CM

## 2015-01-26 MED ORDER — PROCHLORPERAZINE MALEATE 10 MG PO TABS: 10.0000 mg | ORAL_TABLET | Freq: Three times a day (TID) | ORAL | Status: AC | PRN

## 2015-01-26 MED ORDER — HYDROCODONE-ACETAMINOPHEN 5-325 MG PO TABS: 1.0000 | ORAL_TABLET | Freq: Two times a day (BID) | ORAL | Status: DC | PRN

## 2015-01-26 MED ORDER — CYCLOBENZAPRINE HCL 10 MG PO TABS: ORAL_TABLET | ORAL | Status: DC

## 2015-01-26 MED ORDER — ALPRAZOLAM 0.25 MG PO TABS: 0.2500 mg | ORAL_TABLET | Freq: Two times a day (BID) | ORAL | Status: DC | PRN

## 2015-01-26 NOTE — Telephone Encounter (Signed)
VM received re: refills for Flexeril, hydrocodone/apap, xanax and compazine.  Compazine and flexeril e-scribed and scripts for hydrocodone and xanax were printed and ready for [name] to sign tomorrow.  Call made to sister's# . Spoke with her and let her know that these refills would be ready for pick up tomorrow. She voiced understanding.

## 2015-02-04 ENCOUNTER — Other Ambulatory Visit: Payer: Medicare Other

## 2015-02-04 ENCOUNTER — Ambulatory Visit: Payer: Medicare Other | Admitting: Physician Assistant

## 2015-02-11 ENCOUNTER — Other Ambulatory Visit: Payer: Medicare Other

## 2015-02-11 ENCOUNTER — Ambulatory Visit: Payer: Medicare Other | Admitting: Physician Assistant

## 2015-02-13 ENCOUNTER — Encounter: Payer: Self-pay | Admitting: Physician Assistant

## 2015-02-13 ENCOUNTER — Other Ambulatory Visit (HOSPITAL_BASED_OUTPATIENT_CLINIC_OR_DEPARTMENT_OTHER): Payer: Medicare Other

## 2015-02-13 ENCOUNTER — Telehealth: Payer: Self-pay | Admitting: Oncology

## 2015-02-13 ENCOUNTER — Ambulatory Visit (HOSPITAL_BASED_OUTPATIENT_CLINIC_OR_DEPARTMENT_OTHER): Payer: Medicare Other | Admitting: Physician Assistant

## 2015-02-13 VITALS — BP 99/61 | HR 93 | Temp 98.1°F | Resp 21 | Ht 67.0 in | Wt 130.1 lb

## 2015-02-13 DIAGNOSIS — C7951 Secondary malignant neoplasm of bone: Secondary | ICD-10-CM | POA: Diagnosis not present

## 2015-02-13 DIAGNOSIS — Z992 Dependence on renal dialysis: Secondary | ICD-10-CM

## 2015-02-13 DIAGNOSIS — D631 Anemia in chronic kidney disease: Secondary | ICD-10-CM | POA: Diagnosis not present

## 2015-02-13 DIAGNOSIS — C649 Malignant neoplasm of unspecified kidney, except renal pelvis: Secondary | ICD-10-CM

## 2015-02-13 DIAGNOSIS — N186 End stage renal disease: Secondary | ICD-10-CM

## 2015-02-13 LAB — CBC WITH DIFFERENTIAL/PLATELET
BASO%: 0.3 % (ref 0.0–2.0)
BASOS ABS: 0 10*3/uL (ref 0.0–0.1)
EOS%: 1.8 % (ref 0.0–7.0)
Eosinophils Absolute: 0.2 10*3/uL (ref 0.0–0.5)
HEMATOCRIT: 35.2 % — AB (ref 38.4–49.9)
HGB: 11.8 g/dL — ABNORMAL LOW (ref 13.0–17.1)
LYMPH%: 21.2 % (ref 14.0–49.0)
MCH: 23.4 pg — AB (ref 27.2–33.4)
MCHC: 33.5 g/dL (ref 32.0–36.0)
MCV: 69.8 fL — AB (ref 79.3–98.0)
MONO#: 1.2 10*3/uL — ABNORMAL HIGH (ref 0.1–0.9)
MONO%: 13.1 % (ref 0.0–14.0)
NEUT#: 5.6 10*3/uL (ref 1.5–6.5)
NEUT%: 63.6 % (ref 39.0–75.0)
NRBC: 0 % (ref 0–0)
PLATELETS: 257 10*3/uL (ref 140–400)
RBC: 5.04 10*6/uL (ref 4.20–5.82)
RDW: 17.3 % — ABNORMAL HIGH (ref 11.0–14.6)
WBC: 8.9 10*3/uL (ref 4.0–10.3)
lymph#: 1.9 10*3/uL (ref 0.9–3.3)

## 2015-02-13 LAB — COMPREHENSIVE METABOLIC PANEL (CC13)
ALK PHOS: 147 U/L (ref 40–150)
ALT: 35 U/L (ref 0–55)
ANION GAP: 19 meq/L — AB (ref 3–11)
AST: 31 U/L (ref 5–34)
Albumin: 2.8 g/dL — ABNORMAL LOW (ref 3.5–5.0)
BILIRUBIN TOTAL: 0.56 mg/dL (ref 0.20–1.20)
BUN: 42.6 mg/dL — AB (ref 7.0–26.0)
CHLORIDE: 98 meq/L (ref 98–109)
CO2: 25 mEq/L (ref 22–29)
Calcium: 8.7 mg/dL (ref 8.4–10.4)
Creatinine: 6.1 mg/dL (ref 0.7–1.3)
EGFR: 11 mL/min/{1.73_m2} — ABNORMAL LOW (ref >90)
GLUCOSE: 87 mg/dL (ref 70–140)
Potassium: 4.4 mEq/L (ref 3.5–5.1)
Sodium: 142 mEq/L (ref 136–145)
Total Protein: 8.7 g/dL — ABNORMAL HIGH (ref 6.4–8.3)

## 2015-02-13 MED ORDER — TRAMADOL HCL 50 MG PO TABS: ORAL_TABLET | ORAL | Status: AC

## 2015-02-13 NOTE — Progress Notes (Signed)
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  PRIOR THERAPY:  Sutent 50 mg daily on days 1-28, off on 29-42. Held as noted above and re-started on 11/22/2013. Discontinued on 06/11/2014 due to hepatotoxicity.  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  Seen 2 weeks before by PCP/Orthopedics with X-ray of hip and CXR revealing lytic sxpansile lesion with pathologic fracture, left inferior lesion.  Onc (Dr. Juliann Mule) consulted on 10/29; Rad onc (Dr. Lisbeth Renshaw) on 11/01   07/31/2013 Imaging CT C/A/P hyperemic 2.3 cm R paratracheal NL, smaller enlarged precarinal, subcarinal, pretracheal, adn prevascular LN, lesion in the proximal R 9th rib, smaller lytic lesion with path fracture of lateral R 6 th rib; 4.5 cm L kidney mass; 3 cm R kid mass   07/31/2013 Imaging MRI,  L hip on admission revealed a 6x6x5 cm homogeneous destructive soft tissue mass destroying the anterior medial and anterior superior aspects of the left acetabulum and lateral aspect of the left surperior pubic ramus.  2 cm mass in the post L5.   08/02/2013 Procedure CT Guide biopsy of left pelci soft tissue mass.    08/05/2013 Pathology Results Metastatic renal cell carcinoma, clear cell type.     08/05/2013 Cancer Staging Stage IV, RCC with metastases to the bone.   08/08/2013 - 08/21/2013 Radiation Therapy Treated to 3 distinct areas corresponding to the lumbar spine from approximately L1-L5, the left hip/pelvis, in the proximal right femur. Each of these separate target areas were treated with 2 fields each. The target areas each received 30 gray in 10 fra   08/24/2013 - 10/17/2013 Chemotherapy Started Sutent 50 mg on Days 1-28, off days 29-42.     10/17/2013 Adverse  Reaction Grade 4 elevated Transiminases with normal Bilirubin.  Sutent held until resolution.   11/17/2013 Imaging Re-staging CT C/A/P:Apparent interval response to therapy with decrease in mediastinal LAD and a lesion in the posterior right iliac bone.Other bony metastases and bilateral heterogeneously enhancing renallesions are not subtantial changed.   11/22/2013 -  Chemotherapy Sutent 50 mg on Days 1-28, off days 29-42 is restarted.   Liver enzymes normal. Referral to hepatology if increases again.  Consider afinitor as an alternative treatment.    02/28/2014 Imaging CAT scan CAP decrease in size of 2 renal lesions, 1 stable.decrease in osseous metastatic disease. Mediastinal LAD stable, Overall impresion stable disease to mild improvement   03/07/2014 - 06/11/2014 Chemotherapy Sutent was continued after that through now. Liver functions have been fine.   06/02/2014 Imaging CT scan CAP shows incraese in mediastinal adenopathy, left kidney lesion size incraese, new pulmonary nodules, progression seen in multi focal lytic bone metastasis (right rib lesion and L2 vertebra worse)   06/23/2014 -  Chemotherapy Afinitor started at 7.5 mg daily dose.    INTERVAL HISTORY: [name] 64 y.o. male  returns today  for follow-up. Since the last visit, he has not reported any major changes in his clinical status. He continues to tolerate this medication without difficulty. He reports continued improvement in pain to his right hip. He now can planes of left hip pain that he states has been worse over the past few days. He is on dialysis and is followed by Dr. Vanetta Mulders, nephrologist. He reports a little  bit of tingling in his right fifth finger but no other specific complaints. He does report that he is not taking the doxepin with any regularity. He denies any fevers or chills or acute shortness of breath.  He denies any yellowing of his eyes.  He does use a walker for mobility and has not reported any syncope or  falls. He denies abdominal pain, nausea/vomiting or rashes. He does not report any headaches or blurry vision or seizures. He does not report any chest pain, palpitation orthopnea. He does not report any cough or wheezing or difficulty breathing. He does not report any nausea, vomiting, abdominal pain. He is not report any change in his bowel habits. He does not report any frequency urgency or hesitancy. He does not report any skeletal complaints. Remainder of his review of systems unremarkable. He requests a refill for his tramadol.  MEDICAL HISTORY: Past Medical History  Diagnosis Date  . Renal disorder   . Hypertension   . Dialysis patient   . Secondary hyperparathyroidism (of renal origin)     s/p parathyroidectomy  . Anemia due to chronic disease treated with erythropoietin   . Closed fracture of pubic ramus 08/2013    pathologic (metastatic renal cell ca)  . History of radiation therapy 08/08/13-08/21/13    L-1-L5, ,lt hip/pelvis,prox rt femur  . 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 Renal insufficiency     on dialysis x 10 yrs     MEDICATIONS: has a current medication list which includes the following prescription(s): afinitor, alprazolam, calcium carbonate, cyclobenzaprine, diphenoxylate-atropine, dss, doxepin, ferrous sulfate, hydrocodone-acetaminophen, losartan, multivitamin, prochlorperazine, ranitidine, eucerin, and tramadol.  SURGICAL HISTORY:  Past Surgical History  Procedure Laterality Date  . Av fistula placement      PHYSICAL EXAMINATION: ECOG PERFORMANCE STATUS: 2.   Blood pressure 99/61, pulse 93, temperature 98.1 F (36.7 C), temperature source Oral, resp. rate 21, height 5\' 7"  (1.702 m), weight 130 lb 1.6 oz (59.013 kg).  GENERAL:alert, no distress and comfortable; chronically ill appearing thin male. SKIN: skin color, texture, turgor are normal, no rashes or significant lesions EYES: normal, Conjunctiva are pink and non-injected, sclera  clear OROPHARYNX:no exudate, no erythema and lips, buccal mucosa, and tongue normal; Poor dentition.  NECK: supple, thyroid normal size, non-tender, without nodularity LYMPH:  no palpable lymphadenopathy in the cervical, axillary or supraclavicular LUNGS: clear to auscultation with normal breathing effort, no wheezes or rhonchi HEART: regular rate & rhythm and no murmurs and no lower extremity edema ABDOMEN:abdomen soft, non-tender and normal bowel sounds Musculoskeletal:no cyanosis of digits and no clubbing; R AV fistula.  NEURO: alert & oriented x 3 with fluent speech.  Labs:  CBC Latest Ref Rng 02/13/2015 12/10/2014 08/25/2014  WBC 4.0 - 10.3 10e3/uL 8.9 6.7 8.8  Hemoglobin 13.0 - 17.1 g/dL 11.8(L) 13.9 11.1(L)  Hematocrit 38.4 - 49.9 % 35.2(L) 41.4 33.3(L)  Platelets 140 - 400 10e3/uL 257 189 246   CMP     Component Value Date/Time   NA 143 12/10/2014 1436   NA 136 08/06/2013 1045   NA 136 08/06/2013 1045   K 5.2* 12/10/2014 1436   K 4.7 08/06/2013 1045   K 4.8 08/06/2013 1045   CL 93* 08/06/2013 1045   CL 93* 08/06/2013 1045   CO2 28 12/10/2014 1436   CO2 25 08/06/2013 1045   CO2 25 08/06/2013 1045   GLUCOSE 75 12/10/2014 1436   GLUCOSE 127* 08/06/2013 1045   GLUCOSE 121* 08/06/2013 1045   BUN  40.9* 12/10/2014 1436   BUN 56* 08/06/2013 1045   BUN 57* 08/06/2013 1045   CREATININE 7.3* 12/10/2014 1436   CREATININE 8.52* 08/06/2013 1045   CREATININE 8.61* 08/06/2013 1045   CALCIUM 8.9 12/10/2014 1436   CALCIUM 9.2 08/06/2013 1045   CALCIUM 9.2 08/06/2013 1045   PROT 8.4* 12/10/2014 1436   PROT 7.7 12/20/2013 1034   ALBUMIN 3.2* 12/10/2014 1436   ALBUMIN 3.7 12/20/2013 1034   AST 25 12/10/2014 1436   AST 30 12/20/2013 1034   ALT 30 12/10/2014 1436   ALT 26 12/20/2013 1034   ALKPHOS 124 12/10/2014 1436   ALKPHOS 84 12/20/2013 1034   BILITOT 0.34 12/10/2014 1436   BILITOT 0.6 12/20/2013 1034   GFRNONAA 6* 08/06/2013 1045   GFRNONAA 6* 08/06/2013 1045   GFRAA 7*  08/06/2013 1045   GFRAA 7* 08/06/2013 1045    EXAM: CT CHEST, ABDOMEN AND PELVIS WITHOUT CONTRAST  TECHNIQUE: Multidetector CT imaging of the chest, abdomen and pelvis was performed following the standard protocol without IV contrast. Sagittal and coronal MPR images reconstructed from axial data set.  COMPARISON: 06/02/2014  FINDINGS: CT CHEST FINDINGS  9 mm short axis RIGHT peritracheal lymph node image 18, previously 10 mm.  18 mm precarinal lymph node image 23 previously 16 mm.  No new thoracic adenopathy.  Mild emphysematous and scattered bronchitic changes again seen.  5 mm nodule at the RIGHT major fissure image 29 unchanged.  Scattered atelectasis RIGHT lower lobe unchanged.  No additional mass, nodule, infiltrate, or pleural effusion.  No pericardial effusion.  Destructive lesion posterior RIGHT ninth rib again identified, slightly increased in size at 4.5 x 2.9 cm previously 3.9 x 2.7 cm, demonstrating reactive sclerosis within the posterolateral aspect of the adjacent vertebra, unchanged.  Old appearing BILATERAL rib fractures identified.  Sclerotic focus posterior LEFT third rib at site of healing fracture again noted.  CT ABDOMEN AND PELVIS FINDINGS  Atrophic kidneys bilaterally with numerous BILATERAL renal cysts.  Again identified mass anterior mid LEFT kidney 3.7 x 4.1 cm image 70, previously 3.9 x 3.8 cm.  Liver, spleen, pancreas, and adrenal glands normal.  Scattered atherosclerotic calcifications.  Gallbladder surgically absent.  Stomach incompletely distended, unable to exclude wall thickening.  Bladder decompressed, unable to assess.  Mild prostatic enlargement, gland 4.9 x 3.8 cm image 112.  RIGHT femoral hernia containing fat.  Scattered muscular atrophy.  Segment of questionable wall thickening versus artifact from underdistention at distal transverse colon, approximately 2.7  cm length.  Remaining bowel loops unremarkable.  No mass, adenopathy, free air or ascites.  Destructive metastatic lesion involving the LEFT superior pubic ramus into roof and anterior column LEFT acetabulum associated with LEFT hip joint effusion.  Destruction at superior aspect of the LEFT femoral head question related to metastasis or avascular necrosis.  Healing fractures of the LEFT inferior pubic ramus.  Lytic metastasis proximal RIGHT femur the level lesser trochanter, 17 mm diameter again seen.  Lytic lesion posterior RIGHT ilium 2.9 x 1.8 cm image 90 previously 2.7 x 1.8.  Lytic lesion posterior aspect L5 vertebral body 2.0 cm height unchanged.  Compression fracture with mixed lytic and sclerotic process L2 vertebra unchanged.  Destruction superior aspect of L3 vertebra in irregularity of the endplates adjacent to the L4-L5 disc may be degenerative in origin, not significantly changed.  Questionable small RIGHT hip joint effusion.  IMPRESSION: Slight increase in size of metastatic lesion at posterior RIGHT ninth rib.  Multiple additional osseous metastases appear grossly stable;  this includes a lytic lesion at the proximal RIGHT femur 1.7 cm diameter, at risk for pathologic fracture.  Stable RIGHT pulmonary nodule.  Slight increase in size of precarinal lymph node now 18 mm short axis previously 16 mm.  Stable LEFT renal mass.  Prostatic enlargement.  RIGHT femoral hernia containing fat.      ASSESSMENT: [name] 64 y.o. male with:   PLAN:  1. 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  He is currently on Afinitor and have tolerated it well. CT scan on 10/06/2014  for the most part showed stable disease. We'll continue to follow him closely and repeat imaging studies in July 2016. If he does have progression of disease, other options will be used at that time.  2. Hepatotoxicity  secondary to Sutent, resolved.  Liver function tests will be repeated today.  3. ESRDz on H/D.  Continue hemodialysis per nephrology with epo (T/T/S).  4. Hypertension.  Not exacerbated by his current anticancer medication.  5. Anemia in chronic kidney disease +/- IDA, resolved  Continue epo and RBC transfusion prn symptoms.   6. Pathological fracture of Left hip secondary to #1.  Completed XRT on 08/21/2013. On hydrocodone at this time. He was given a refill prescription for his tramadol.  7. Anorexia and decreased appetite. He is supplementing his diet with ensure/boost.   8. Follow-up.  Will be in 2 monthsfor a clinical checkup with a restaging CT scan and at that time he will need to have a CT scan of the chest, abdomen and pelvis without contrast to reevaluate his disease. CT scan was scheduled today.   Awilda Metro E PA-C  02/13/2015

## 2015-02-13 NOTE — Telephone Encounter (Signed)
Gave adn printed appt sched and avs fo rpt for July gv barium

## 2015-02-16 NOTE — Patient Instructions (Signed)
Continue taking Afinitor 7.5 mg by mouth daily Follow-up in 2 months with a restaging CT scan of your chest, abdomen and pelvis to reevaluate your disease

## 2015-02-25 ENCOUNTER — Other Ambulatory Visit: Payer: Self-pay | Admitting: *Deleted

## 2015-02-25 DIAGNOSIS — Z992 Dependence on renal dialysis: Secondary | ICD-10-CM

## 2015-02-25 DIAGNOSIS — C7951 Secondary malignant neoplasm of bone: Principal | ICD-10-CM

## 2015-02-25 DIAGNOSIS — N186 End stage renal disease: Secondary | ICD-10-CM

## 2015-02-25 DIAGNOSIS — C649 Malignant neoplasm of unspecified kidney, except renal pelvis: Secondary | ICD-10-CM

## 2015-02-25 MED ORDER — HYDROCODONE-ACETAMINOPHEN 5-325 MG PO TABS: 1.0000 | ORAL_TABLET | Freq: Two times a day (BID) | ORAL | Status: DC | PRN

## 2015-02-25 NOTE — Telephone Encounter (Signed)
Refill: [facility]. Patient family member notified and verbalized understanding. Will pick up prescription 02/26/15.

## 2015-03-19 ENCOUNTER — Telehealth: Payer: Self-pay | Admitting: *Deleted

## 2015-03-19 NOTE — Telephone Encounter (Signed)
Sister, Kandice Moos called and left message requesting refill of xanax and hydrocodone.  She requests that we call her when it is ready so she can pick it up.  Call back is [phone].  (Note:  There is no ROI for [place] noted in chart).

## 2015-03-20 ENCOUNTER — Other Ambulatory Visit: Payer: Self-pay | Admitting: *Deleted

## 2015-03-20 ENCOUNTER — Encounter: Payer: Self-pay | Admitting: *Deleted

## 2015-03-20 DIAGNOSIS — C7951 Secondary malignant neoplasm of bone: Principal | ICD-10-CM

## 2015-03-20 DIAGNOSIS — C649 Malignant neoplasm of unspecified kidney, except renal pelvis: Secondary | ICD-10-CM

## 2015-03-20 DIAGNOSIS — Z992 Dependence on renal dialysis: Secondary | ICD-10-CM

## 2015-03-20 DIAGNOSIS — N186 End stage renal disease: Secondary | ICD-10-CM

## 2015-03-20 MED ORDER — HYDROCODONE-ACETAMINOPHEN 5-325 MG PO TABS: 1.0000 | ORAL_TABLET | Freq: Two times a day (BID) | ORAL | Status: DC | PRN

## 2015-03-20 MED ORDER — ALPRAZOLAM 0.25 MG PO TABS: 0.2500 mg | ORAL_TABLET | Freq: Two times a day (BID) | ORAL | Status: DC | PRN

## 2015-03-20 NOTE — Telephone Encounter (Signed)
Notified sister shurrane that patient's scripts are ready for p/u.

## 2015-03-25 ENCOUNTER — Telehealth: Payer: Self-pay | Admitting: *Deleted

## 2015-03-25 NOTE — Telephone Encounter (Signed)
Received call from [place], pharmacist @ [facility] on [address] clarification of pt's pain meds.  Pt had Vicodin  # 90 refilled on 03/20/15;  And brought in script for Tramadol - written on 02/13/15 to be filled.  [name] wanted to know if Lance Metro, PA would ok for the fill.  Spoke with [name] again and informed him re:  Per [place], PA , ok to fill Tramadol as written. Spoke with sister [name], and was informed re:  Pt takes Tramadol for mild pain on Dialysis days ( less drowsiness for pt ).  On days of Non dialysis,  Pt takes Vicodin for pain.  [name] understood to monitor pt for constipation when taking pain meds. [facility] pharmacy  Phone    [phone].

## 2015-03-26 ENCOUNTER — Emergency Department (HOSPITAL_COMMUNITY): Payer: Medicare Other

## 2015-03-26 ENCOUNTER — Emergency Department (HOSPITAL_COMMUNITY)
Admission: EM | Admit: 2015-03-26 | Discharge: 2015-03-26 | Disposition: A | Discharge status: Home / Self Care | Payer: Medicare Other | Attending: Emergency Medicine | Admitting: Emergency Medicine

## 2015-03-26 ENCOUNTER — Encounter (HOSPITAL_COMMUNITY): Payer: Self-pay | Admitting: *Deleted

## 2015-03-26 ENCOUNTER — Emergency Department (HOSPITAL_COMMUNITY): Payer: Medicare Other | Admitting: Certified Registered Nurse Anesthetist

## 2015-03-26 ENCOUNTER — Encounter (HOSPITAL_COMMUNITY)
Admission: EM | Disposition: A | Discharge status: Home / Self Care | Payer: Self-pay | Source: Home / Self Care | Attending: Emergency Medicine

## 2015-03-26 ENCOUNTER — Telehealth: Payer: Self-pay | Admitting: *Deleted

## 2015-03-26 DIAGNOSIS — Z79899 Other long term (current) drug therapy: Secondary | ICD-10-CM | POA: Diagnosis not present

## 2015-03-26 DIAGNOSIS — Y832 Surgical operation with anastomosis, bypass or graft as the cause of abnormal reaction of the patient, or of later complication, without mention of misadventure at the time of the procedure: Secondary | ICD-10-CM | POA: Insufficient documentation

## 2015-03-26 DIAGNOSIS — Z85830 Personal history of malignant neoplasm of bone: Secondary | ICD-10-CM | POA: Insufficient documentation

## 2015-03-26 DIAGNOSIS — N186 End stage renal disease: Secondary | ICD-10-CM | POA: Insufficient documentation

## 2015-03-26 DIAGNOSIS — R042 Hemoptysis: Secondary | ICD-10-CM

## 2015-03-26 DIAGNOSIS — F17210 Nicotine dependence, cigarettes, uncomplicated: Secondary | ICD-10-CM | POA: Diagnosis not present

## 2015-03-26 DIAGNOSIS — T82898A Other specified complication of vascular prosthetic devices, implants and grafts, initial encounter: Secondary | ICD-10-CM | POA: Insufficient documentation

## 2015-03-26 DIAGNOSIS — Z85528 Personal history of other malignant neoplasm of kidney: Secondary | ICD-10-CM | POA: Insufficient documentation

## 2015-03-26 DIAGNOSIS — D649 Anemia, unspecified: Secondary | ICD-10-CM

## 2015-03-26 DIAGNOSIS — I120 Hypertensive chronic kidney disease with stage 5 chronic kidney disease or end stage renal disease: Secondary | ICD-10-CM | POA: Diagnosis not present

## 2015-03-26 DIAGNOSIS — N2581 Secondary hyperparathyroidism of renal origin: Secondary | ICD-10-CM | POA: Insufficient documentation

## 2015-03-26 DIAGNOSIS — T827XXA Infection and inflammatory reaction due to other cardiac and vascular devices, implants and grafts, initial encounter: Secondary | ICD-10-CM

## 2015-03-26 DIAGNOSIS — Z923 Personal history of irradiation: Secondary | ICD-10-CM | POA: Insufficient documentation

## 2015-03-26 DIAGNOSIS — D638 Anemia in other chronic diseases classified elsewhere: Secondary | ICD-10-CM | POA: Insufficient documentation

## 2015-03-26 DIAGNOSIS — Z992 Dependence on renal dialysis: Secondary | ICD-10-CM | POA: Diagnosis not present

## 2015-03-26 DIAGNOSIS — Y92239 Unspecified place in hospital as the place of occurrence of the external cause: Secondary | ICD-10-CM | POA: Insufficient documentation

## 2015-03-26 HISTORY — PX: REVISON OF ARTERIOVENOUS FISTULA: SHX6074

## 2015-03-26 LAB — CBC WITH DIFFERENTIAL/PLATELET
BASOS ABS: 0 10*3/uL (ref 0.0–0.1)
Basophils Relative: 0 % (ref 0–1)
EOS ABS: 0.1 10*3/uL (ref 0.0–0.7)
Eosinophils Relative: 1 % (ref 0–5)
HCT: 25.8 % — ABNORMAL LOW (ref 39.0–52.0)
Hemoglobin: 8.3 g/dL — ABNORMAL LOW (ref 13.0–17.0)
LYMPHS ABS: 1.7 10*3/uL (ref 0.7–4.0)
Lymphocytes Relative: 14 % (ref 12–46)
MCH: 21.2 pg — ABNORMAL LOW (ref 26.0–34.0)
MCHC: 32.2 g/dL (ref 30.0–36.0)
MCV: 65.8 fL — ABNORMAL LOW (ref 78.0–100.0)
Monocytes Absolute: 1.2 10*3/uL — ABNORMAL HIGH (ref 0.1–1.0)
Monocytes Relative: 10 % (ref 3–12)
Neutro Abs: 8.8 10*3/uL — ABNORMAL HIGH (ref 1.7–7.7)
Neutrophils Relative %: 75 % (ref 43–77)
PLATELETS: 309 10*3/uL (ref 150–400)
RBC: 3.92 MIL/uL — ABNORMAL LOW (ref 4.22–5.81)
RDW: 19.6 % — AB (ref 11.5–15.5)
WBC: 11.8 10*3/uL — AB (ref 4.0–10.5)

## 2015-03-26 LAB — COMPREHENSIVE METABOLIC PANEL
ALBUMIN: 2.4 g/dL — AB (ref 3.5–5.0)
ALT: 25 U/L (ref 17–63)
ANION GAP: 11 (ref 5–15)
AST: 33 U/L (ref 15–41)
Alkaline Phosphatase: 119 U/L (ref 38–126)
CALCIUM: 11 mg/dL — AB (ref 8.9–10.3)
CO2: 28 mmol/L (ref 22–32)
CREATININE: 1.84 mg/dL — AB (ref 0.61–1.24)
Chloride: 97 mmol/L — ABNORMAL LOW (ref 101–111)
GFR calc non Af Amer: 38 mL/min — ABNORMAL LOW (ref >60)
GFR, EST AFRICAN AMERICAN: 44 mL/min — AB (ref >60)
GLUCOSE: 102 mg/dL — AB (ref 65–99)
Potassium: 3.1 mmol/L — ABNORMAL LOW (ref 3.5–5.1)
Sodium: 136 mmol/L (ref 135–145)
TOTAL PROTEIN: 8.5 g/dL — AB (ref 6.5–8.1)
Total Bilirubin: 0.8 mg/dL (ref 0.3–1.2)

## 2015-03-26 SURGERY — REVISON OF ARTERIOVENOUS FISTULA
Anesthesia: General | Site: Arm Upper | Laterality: Right

## 2015-03-26 MED ORDER — HYDROMORPHONE HCL 1 MG/ML IJ SOLN: 0.2500 mg | INTRAMUSCULAR | Status: DC | PRN

## 2015-03-26 MED ORDER — THROMBIN 20000 UNITS EX SOLR
CUTANEOUS | Status: AC
  Filled 2015-03-26: qty 20000

## 2015-03-26 MED ORDER — SODIUM CHLORIDE 0.9 % IV SOLN
INTRAVENOUS | Status: DC
  Administered 2015-03-26: 16:00:00 via INTRAVENOUS

## 2015-03-26 MED ORDER — PROPOFOL 10 MG/ML IV BOLUS
INTRAVENOUS | Status: DC | PRN
  Administered 2015-03-26: 100 mg via INTRAVENOUS

## 2015-03-26 MED ORDER — CEFTAZIDIME 2 G IJ SOLR
2.0000 g | Freq: Once | INTRAMUSCULAR | Status: DC
  Filled 2015-03-26: qty 2

## 2015-03-26 MED ORDER — OXYCODONE-ACETAMINOPHEN 5-325 MG PO TABS: 1.0000 | ORAL_TABLET | ORAL | Status: AC | PRN

## 2015-03-26 MED ORDER — ONDANSETRON HCL 4 MG/2ML IJ SOLN
INTRAMUSCULAR | Status: AC
  Filled 2015-03-26: qty 2

## 2015-03-26 MED ORDER — FENTANYL CITRATE (PF) 100 MCG/2ML IJ SOLN
INTRAMUSCULAR | Status: DC | PRN
  Administered 2015-03-26: 50 ug via INTRAVENOUS
  Administered 2015-03-26: 100 ug via INTRAVENOUS

## 2015-03-26 MED ORDER — SODIUM CHLORIDE 0.9 % IR SOLN
Status: DC | PRN
  Administered 2015-03-26: 500 mL

## 2015-03-26 MED ORDER — LIDOCAINE HCL (PF) 1 % IJ SOLN
INTRAMUSCULAR | Status: DC | PRN
  Administered 2015-03-26: 2 mL

## 2015-03-26 MED ORDER — MIDAZOLAM HCL 5 MG/5ML IJ SOLN
INTRAMUSCULAR | Status: DC | PRN
Start: 2015-03-26 — End: 2015-03-26
  Administered 2015-03-26: 2 mg via INTRAVENOUS

## 2015-03-26 MED ORDER — MIDAZOLAM HCL 2 MG/2ML IJ SOLN
INTRAMUSCULAR | Status: AC
  Filled 2015-03-26: qty 2

## 2015-03-26 MED ORDER — SODIUM CHLORIDE 0.9 % IR SOLN
Status: DC | PRN
  Administered 2015-03-26: 1000 mL

## 2015-03-26 MED ORDER — LIDOCAINE HCL (PF) 1 % IJ SOLN
INTRAMUSCULAR | Status: AC
  Filled 2015-03-26: qty 30

## 2015-03-26 MED ORDER — ONDANSETRON HCL 4 MG/2ML IJ SOLN
INTRAMUSCULAR | Status: DC | PRN
  Administered 2015-03-26: 4 mg via INTRAVENOUS

## 2015-03-26 MED ORDER — PROPOFOL 10 MG/ML IV BOLUS
INTRAVENOUS | Status: AC
  Filled 2015-03-26: qty 20

## 2015-03-26 MED ORDER — PROMETHAZINE HCL 25 MG/ML IJ SOLN: 6.2500 mg | INTRAMUSCULAR | Status: DC | PRN

## 2015-03-26 MED ORDER — BACITRACIN ZINC 500 UNIT/GM EX OINT
TOPICAL_OINTMENT | CUTANEOUS | Status: AC
  Filled 2015-03-26: qty 28.35

## 2015-03-26 MED ORDER — FENTANYL CITRATE (PF) 250 MCG/5ML IJ SOLN
INTRAMUSCULAR | Status: AC
  Filled 2015-03-26: qty 5

## 2015-03-26 MED ORDER — SUCCINYLCHOLINE CHLORIDE 20 MG/ML IJ SOLN
INTRAMUSCULAR | Status: DC | PRN
  Administered 2015-03-26: 100 mg via INTRAVENOUS

## 2015-03-26 MED ORDER — LIDOCAINE HCL (CARDIAC) 20 MG/ML IV SOLN
INTRAVENOUS | Status: DC | PRN
  Administered 2015-03-26: 80 mg via INTRAVENOUS

## 2015-03-26 MED ORDER — VANCOMYCIN HCL 10 G IV SOLR
1250.0000 mg | Freq: Once | INTRAVENOUS | Status: DC
  Filled 2015-03-26: qty 1250

## 2015-03-26 MED ORDER — CEFAZOLIN SODIUM 1-5 GM-% IV SOLN
1.0000 g | INTRAVENOUS | Status: AC
  Administered 2015-03-26: 1 g via INTRAVENOUS
  Filled 2015-03-26: qty 50

## 2015-03-26 SURGICAL SUPPLY — 33 items
BANDAGE ELASTIC 4 VELCRO ST LF (GAUZE/BANDAGES/DRESSINGS) ×3 IMPLANT
BNDG GAUZE ELAST 4 BULKY (GAUZE/BANDAGES/DRESSINGS) ×3 IMPLANT
CANISTER SUCTION 2500CC (MISCELLANEOUS) ×3 IMPLANT
CANNULA VESSEL 3MM 2 BLNT TIP (CANNULA) IMPLANT
CLIP TI MEDIUM 6 (CLIP) ×3 IMPLANT
CLIP TI WIDE RED SMALL 6 (CLIP) ×3 IMPLANT
COVER PROBE W GEL 5X96 (DRAPES) ×3 IMPLANT
ELECT REM PT RETURN 9FT ADLT (ELECTROSURGICAL) ×3
ELECTRODE REM PT RTRN 9FT ADLT (ELECTROSURGICAL) ×1 IMPLANT
GLOVE BIO SURGEON STRL SZ7.5 (GLOVE) ×3 IMPLANT
GLOVE BIOGEL PI IND STRL 6.5 (GLOVE) ×2 IMPLANT
GLOVE BIOGEL PI IND STRL 8 (GLOVE) ×1 IMPLANT
GLOVE BIOGEL PI INDICATOR 6.5 (GLOVE) ×4
GLOVE BIOGEL PI INDICATOR 8 (GLOVE) ×2
GLOVE SKINSENSE STRL SZ6.0 (GLOVE) ×3 IMPLANT
GOWN STRL REUS W/ TWL LRG LVL3 (GOWN DISPOSABLE) ×3 IMPLANT
GOWN STRL REUS W/TWL LRG LVL3 (GOWN DISPOSABLE) ×6
KIT BASIN OR (CUSTOM PROCEDURE TRAY) ×3 IMPLANT
KIT ROOM TURNOVER OR (KITS) ×3 IMPLANT
LIQUID BAND (GAUZE/BANDAGES/DRESSINGS) ×3 IMPLANT
NS IRRIG 1000ML POUR BTL (IV SOLUTION) ×3 IMPLANT
PACK CV ACCESS (CUSTOM PROCEDURE TRAY) ×3 IMPLANT
PAD ARMBOARD 7.5X6 YLW CONV (MISCELLANEOUS) ×6 IMPLANT
SPONGE GAUZE 4X4 12PLY STER LF (GAUZE/BANDAGES/DRESSINGS) ×3 IMPLANT
SPONGE SURGIFOAM ABS GEL 100 (HEMOSTASIS) IMPLANT
SUT ETHILON 3 0 PS 1 (SUTURE) ×3 IMPLANT
SUT PROLENE 5 0 C 1 24 (SUTURE) ×3 IMPLANT
SUT PROLENE 6 0 BV (SUTURE) ×3 IMPLANT
SUT VIC AB 3-0 SH 27 (SUTURE) ×2
SUT VIC AB 3-0 SH 27X BRD (SUTURE) ×1 IMPLANT
SUT VICRYL 4-0 PS2 18IN ABS (SUTURE) ×3 IMPLANT
UNDERPAD 30X30 INCONTINENT (UNDERPADS AND DIAPERS) ×3 IMPLANT
WATER STERILE IRR 1000ML POUR (IV SOLUTION) ×3 IMPLANT

## 2015-03-26 NOTE — Telephone Encounter (Signed)
Ria Bush called on her brother's behalf.  "He receives dialysis and the doctor called me reporting he is coughing up blood.  He also has an infected AV fistula.  He is going to the ER.  I want to make sure we are doing the right thing and make [name] is aware he may be admitted.  He takes Afinitor and has scans scheduled in July so if he is admitted can he have these scans done while there?"  Will notify [name] of sister's call.  Advised that if scans are ordered to notify techs of the future CT C/A/P orders.

## 2015-03-26 NOTE — Transfer of Care (Signed)
Immediate Anesthesia Transfer of Care Note  Patient: [name]  Procedure(s) Performed: Procedure(s): Exploration and Repair RIGHT Arm Arteriovenous  FISTULA  (Right)  Patient Location: PACU  Anesthesia Type:General  Level of Consciousness: awake, alert  and oriented  Airway & Oxygen Therapy: Patient Spontanous Breathing and Patient connected to face mask oxygen  Post-op Assessment: Report given to RN and Post -op Vital signs reviewed and stable  Post vital signs: Reviewed and stable  Last Vitals:  Filed Vitals:   03/26/15 1515  BP: 114/62  Pulse:   Temp:   Resp: 19    Complications: No apparent anesthesia complications

## 2015-03-26 NOTE — Anesthesia Procedure Notes (Signed)
Procedure Name: Intubation Date/Time: 03/26/2015 4:11 PM Performed by: Clearnce Sorrel Pre-anesthesia Checklist: Patient identified, Timeout performed, Emergency Drugs available, Suction available and Patient being monitored Patient Re-evaluated:Patient Re-evaluated prior to inductionOxygen Delivery Method: Circle system utilized Preoxygenation: Pre-oxygenation with 100% oxygen Intubation Type: IV induction Ventilation: Mask ventilation without difficulty Laryngoscope Size: Mac and 3 Grade View: Grade I Tube type: Oral Tube size: 7.5 mm Number of attempts: 1 Airway Equipment and Method: Stylet Placement Confirmation: ETT inserted through vocal cords under direct vision,  breath sounds checked- equal and bilateral and positive ETCO2 Secured at: 23 cm Tube secured with: Tape Dental Injury: Teeth and Oropharynx as per pre-operative assessment

## 2015-03-26 NOTE — Anesthesia Preprocedure Evaluation (Addendum)
Anesthesia Evaluation  Patient identified by MRN, date of birth, ID band Patient awake    Reviewed: Allergy & Precautions, NPO status , Patient's Chart, lab work & pertinent test results  History of Anesthesia Complications Negative for: history of anesthetic complications  Airway Mallampati: II  TM Distance: >3 FB     Dental  (+) Missing,    Pulmonary Current Smoker,    Pulmonary exam normal       Cardiovascular hypertension, Normal cardiovascular examRhythm:Regular     Neuro/Psych negative neurological ROS  negative psych ROS   GI/Hepatic GERD-  ,(+) Hepatitis -, C  Endo/Other  negative endocrine ROS  Renal/GU DialysisRenal disease     Musculoskeletal   Abdominal   Peds  Hematology   Anesthesia Other Findings   Reproductive/Obstetrics                           Anesthesia Physical Anesthesia Plan  ASA: III and emergent  Anesthesia Plan: General   Post-op Pain Management:    Induction: Intravenous  Airway Management Planned: Oral ETT  Additional Equipment:   Intra-op Plan:   Post-operative Plan: Extubation in OR  Informed Consent:   Dental advisory given  Plan Discussed with: Anesthesiologist and Surgeon  Anesthesia Plan Comments:        Anesthesia Quick Evaluation

## 2015-03-26 NOTE — Consult Note (Signed)
[facility]  Patient name: [name] MRN: [id number] DOB: 14-May-1951 Sex: male  REASON FOR CONSULT: Eschar over right BC AVF  HPI: [name] is a 65 y.o. male who has had a right upper arm AVF for many years. He developed a wound over the fistula in the upper arm. The HD center sent him to the Ed to have it evaluated. No fever of chills. He has had paresthesis in his right hand for a long time. No other symptoms.   Past Medical History  Diagnosis Date  . Renal disorder   . Hypertension   . Dialysis patient   . Secondary hyperparathyroidism (of renal origin)     s/p parathyroidectomy  . Anemia due to chronic disease treated with erythropoietin   . Closed fracture of pubic ramus 08/2013    pathologic (metastatic renal cell ca)  . History of radiation therapy 08/08/13-08/21/13    L-1-L5, ,lt hip/pelvis,prox rt femur  . 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 Renal insufficiency     on dialysis x 10 yrs    Family History  Problem Relation Age of Onset  . Lung cancer      SOCIAL HISTORY: History  Substance Use Topics  . Smoking status: Current Every Day Smoker -- 1.00 packs/day for 15 years    Types: Cigarettes  . Smokeless tobacco: Current User  . Alcohol Use: No    No Known Allergies  Current Facility-Administered Medications  Medication Dose Route Frequency Provider Last Rate Last Dose  . [START ON 03/27/2015] ceFAZolin (ANCEF) IVPB 1 g/50 mL premix  1 g Intravenous On Call Angelia Mould, MD      . cefTAZidime (FORTAZ) 2 g in dextrose 5 % 50 mL IVPB  2 g Intravenous Once Romona Curls, [facility]      . vancomycin (VANCOCIN) 1,250 mg in sodium chloride 0.9 % 250 mL IVPB  1,250 mg Intravenous Once Romona Curls, [facility]       Current Outpatient Prescriptions  Medication Sig Dispense Refill  . AFINITOR 7.5 MG tablet Take 1 tablet by mouth daily.  3  . ALPRAZolam (XANAX) 0.25 MG tablet Take 1 tablet (0.25 mg total) by mouth  2 (two) times daily as needed. 60 tablet 0  . calcium carbonate (TUMS - DOSED IN MG ELEMENTAL CALCIUM) 500 MG chewable tablet Chew 3 tablets by mouth daily.    . cyclobenzaprine (FLEXERIL) 10 MG tablet TAKE 1 TABLET BY MOUTH THREE TIMES DAILY IF NEEDED FOR MUSCLE SPASM 60 tablet 0  . diphenoxylate-atropine (LOMOTIL) 2.5-0.025 MG per tablet Take 1 tablet by mouth 3 (three) times daily as needed for diarrhea or loose stools.    . docusate sodium 100 MG CAPS Take 100 mg by mouth at bedtime. 10 capsule 0  . doxepin (SINEQUAN) 25 MG capsule Take 25 mg by mouth at bedtime.    . ferrous sulfate 325 (65 FE) MG EC tablet Take 1 tablet (325 mg total) by mouth 2 (two) times daily with breakfast and lunch. 60 tablet 3  . HYDROcodone-acetaminophen (NORCO/VICODIN) 5-325 MG per tablet Take 1-2 tablets by mouth 2 (two) times daily as needed. 90 tablet 0  . losartan (COZAAR) 25 MG tablet Take 25 mg by mouth at bedtime.    . multivitamin (RENA-VIT) TABS tablet Take 1 tablet by mouth at bedtime. 30 tablet 0  . prochlorperazine (COMPAZINE) 10 MG tablet Take 1 tablet (10 mg total) by mouth every 8 (eight) hours  as needed for nausea or vomiting. 30 tablet 0  . ranitidine (ZANTAC) 150 MG capsule Take 150 mg by mouth 2 (two) times daily.    . Skin Protectants, Misc. (EUCERIN) cream Apply 1 application topically as needed for dry skin.    Marland Kitchen traMADol (ULTRAM) 50 MG tablet Take 1 tablet by mouth no more than twice a day as needed for pain 60 tablet 0    REVIEW OF SYSTEMS: Valu.Nieves ] denotes positive finding; [  ] denotes negative finding CARDIOVASCULAR:  [ ]  chest pain   [ ]  chest pressure   [ ]  palpitations   [ ]  orthopnea   [ ]  dyspnea on exertion   [ ]  claudication   [ ]  rest pain   [ ]  DVT   [ ]  phlebitis PULMONARY:   [ ]  productive cough   [ ]  asthma   [ ]  wheezing NEUROLOGIC:   [ ]  weakness  [ ]  paresthesias  [ ]  aphasia  [ ]  amaurosis  [ ]  dizziness HEMATOLOGIC:   [ ]  bleeding problems   [ ]  clotting  disorders MUSCULOSKELETAL:  [ ]  joint pain   [ ]  joint swelling [ ]  leg swelling GASTROINTESTINAL: [ ]   blood in stool  [ ]   hematemesis GENITOURINARY:  [ ]   dysuria  [ ]   hematuria PSYCHIATRIC:  [ ]  history of major depression INTEGUMENTARY:  [ ]  rashes  [ ]  ulcers CONSTITUTIONAL:  [ ]  fever   [ ]  chills  PHYSICAL EXAM: Filed Vitals:   03/26/15 1141 03/26/15 1142 03/26/15 1407  BP: 108/87  118/95  Pulse: 92  102  Temp: 98 F (36.7 C)    TempSrc: Oral    Resp: 20  18  Weight:  124 lb 6 oz (56.416 kg)   SpO2: 93%  100%   Body mass index is 19.48 kg/(m^2). GENERAL: The patient is a well-nourished male, in no acute distress. The vital signs are documented above. CARDIOVASCULAR: There is a regular rate and rhythm. Good thrill in right upper arm AVF PULMONARY: There is good air exchange bilaterally without wheezing or rales. ABDOMEN: Soft and non-tender with normal pitched bowel sounds.  MUSCULOSKELETAL: There are no major deformities or cyanosis. NEUROLOGIC: No focal weakness or paresthesias are detected. SKIN: Eschar over AVF in right upper arm PSYCHIATRIC: The patient has a normal affect.  DATA:  Lab Results  Component Value Date   WBC 11.8* 03/26/2015   HGB 8.3* 03/26/2015   HCT 25.8* 03/26/2015   MCV 65.8* 03/26/2015   PLT 309 03/26/2015   Lab Results  Component Value Date   NA 136 03/26/2015   K 3.1* 03/26/2015   CL 97* 03/26/2015   CO2 28 03/26/2015   Lab Results  Component Value Date   CREATININE 1.84* 03/26/2015   Lab Results  Component Value Date   INR 1.10 08/02/2013   INR 0.99 11/19/2009   No results found for: HGBA1C CBG (last 3)  No results for input(s): GLUCAP in the last 72 hours.   MEDICAL ISSUES: Eschar over right upper arm AVF: will need exploration and repair of fistula given risk of bleeding. Will proceed today. Risks and indications discussed with pt. He last ate at [place], [place] [facility]: [place] Rhonda W: [phone]

## 2015-03-26 NOTE — ED Provider Notes (Signed)
CSN: [id number]     Arrival date & time 03/26/15  1134 History   First MD Initiated Contact with Patient 03/26/15 1413     Chief Complaint  Patient presents with  . Vascular Access Problem    infection   . Hemoptysis     (Consider location/radiation/quality/duration/timing/severity/associated sxs/prior Treatment) HPI  The patient is a 64 year old male, he has a history of end-stage renal disease, he was at dialysis today, he was told that his fistula was infected and that he needed to come to the hospital.  He denies fevers or chills but has had a significant cough. He is on oral chemotherapy, he comes from the dialysis center with request for admission per the sister who spoke with the nephrologist.  He has intermittent pain in the arm.  Denies fevers.  Denies rectal bleeding.  Past Medical History  Diagnosis Date  . Renal disorder   . Hypertension   . Dialysis patient   . Secondary hyperparathyroidism (of renal origin)     s/p parathyroidectomy  . Anemia due to chronic disease treated with erythropoietin   . Closed fracture of pubic ramus 08/2013    pathologic (metastatic renal cell ca)  . History of radiation therapy 08/08/13-08/21/13    L-1-L5, ,lt hip/pelvis,prox rt femur  . 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 Renal insufficiency     on dialysis x 10 yrs   Past Surgical History  Procedure Laterality Date  . Av fistula placement     Family History  Problem Relation Age of Onset  . Lung cancer     History  Substance Use Topics  . Smoking status: Current Every Day Smoker -- 1.00 packs/day for 15 years    Types: Cigarettes  . Smokeless tobacco: Current User  . Alcohol Use: No    Review of Systems  All other systems reviewed and are negative.     Allergies  Review of patient's allergies indicates no known allergies.  Home Medications   Prior to Admission medications   Medication Sig Start Date End Date Taking? Authorizing Provider  AFINITOR  7.5 MG tablet Take 1 tablet by mouth daily. 07/10/14   Historical Provider, MD  ALPRAZolam Duanne Moron) 0.25 MG tablet Take 1 tablet (0.25 mg total) by mouth 2 (two) times daily as needed. 03/20/15   [name], MD  calcium carbonate (TUMS - DOSED IN MG ELEMENTAL CALCIUM) 500 MG chewable tablet Chew 3 tablets by mouth daily.    Historical Provider, MD  cyclobenzaprine (FLEXERIL) 10 MG tablet TAKE 1 TABLET BY MOUTH THREE TIMES DAILY IF NEEDED FOR MUSCLE SPASM 01/26/15   [name], MD  diphenoxylate-atropine (LOMOTIL) 2.5-0.025 MG per tablet Take 1 tablet by mouth 3 (three) times daily as needed for diarrhea or loose stools.    Historical Provider, MD  docusate sodium 100 MG CAPS Take 100 mg by mouth at bedtime. 08/08/13   Nishant Dhungel, MD  doxepin (SINEQUAN) 25 MG capsule Take 25 mg by mouth at bedtime.    Historical Provider, MD  ferrous sulfate 325 (65 FE) MG EC tablet Take 1 tablet (325 mg total) by mouth 2 (two) times daily with breakfast and lunch. 10/18/13   Concha Norway, MD  HYDROcodone-acetaminophen (NORCO/VICODIN) 5-325 MG per tablet Take 1-2 tablets by mouth 2 (two) times daily as needed. 03/20/15   [name], MD  losartan (COZAAR) 25 MG tablet Take 25 mg by mouth at bedtime.    Historical Provider, MD  multivitamin (RENA-VIT)  TABS tablet Take 1 tablet by mouth at bedtime. 08/08/13   Nishant Dhungel, MD  prochlorperazine (COMPAZINE) 10 MG tablet Take 1 tablet (10 mg total) by mouth every 8 (eight) hours as needed for nausea or vomiting. 01/26/15   [name], MD  ranitidine (ZANTAC) 150 MG capsule Take 150 mg by mouth 2 (two) times daily.    Historical Provider, MD  Skin Protectants, Misc. (EUCERIN) cream Apply 1 application topically as needed for dry skin.    Historical Provider, MD  traMADol (ULTRAM) 50 MG tablet Take 1 tablet by mouth no more than twice a day as needed for pain 02/13/15   Adrena E Johnson, PA-C   BP 118/95 mmHg  Pulse 102  Temp(Src) 98 F (36.7 C) (Oral)  Resp  18  Wt 124 lb 6 oz (56.416 kg)  SpO2 100% Physical Exam  Constitutional: He appears well-developed and well-nourished. No distress.  HENT:  Head: Normocephalic and atraumatic.  Mouth/Throat: Oropharynx is clear and moist. No oropharyngeal exudate.  Eyes: Conjunctivae and EOM are normal. Pupils are equal, round, and reactive to light. Right eye exhibits no discharge. Left eye exhibits no discharge. No scleral icterus.  Neck: Normal range of motion. Neck supple. No JVD present. No thyromegaly present.  Cardiovascular: Normal rate, regular rhythm, normal heart sounds and intact distal pulses.  Exam reveals no gallop and no friction rub.   No murmur heard. Pulmonary/Chest: Effort normal and breath sounds normal. No respiratory distress. He has no wheezes. He has no rales.  Frequent coughing spells, purulent mucus expectorated  Abdominal: Soft. Bowel sounds are normal. He exhibits no distension and no mass. There is no tenderness.  Musculoskeletal: Normal range of motion. He exhibits tenderness ( Minimal tenderness over the right upper arm fistula, no redness, dressing is taken down, small area of ulcer, spot purulence, no surrounding redness or warmth). He exhibits no edema.  Lymphadenopathy:    He has no cervical adenopathy.  Neurological: He is alert. Coordination normal.  Skin: Skin is warm and dry. No rash noted. No erythema.  Psychiatric: He has a normal mood and affect. His behavior is normal.  Nursing note and vitals reviewed.   ED Course  Procedures (including critical care time) Labs Review Labs Reviewed  CBC WITH DIFFERENTIAL/PLATELET - Abnormal; Notable for the following:    WBC 11.8 (*)    RBC 3.92 (*)    Hemoglobin 8.3 (*)    HCT 25.8 (*)    MCV 65.8 (*)    MCH 21.2 (*)    RDW 19.6 (*)    Neutro Abs 8.8 (*)    Monocytes Absolute 1.2 (*)    All other components within normal limits  COMPREHENSIVE METABOLIC PANEL - Abnormal; Notable for the following:    Potassium 3.1  (*)    Chloride 97 (*)    Glucose, Bld 102 (*)    BUN <5 (*)    Creatinine, Ser 1.84 (*)    Calcium 11.0 (*)    Total Protein 8.5 (*)    Albumin 2.4 (*)    GFR calc non Af Amer 38 (*)    GFR calc Af Amer 44 (*)    All other components within normal limits  CULTURE, BLOOD (ROUTINE X 2)  CULTURE, BLOOD (ROUTINE X 2)    Imaging Review Dg Chest 2 View  03/26/2015   CLINICAL DATA:  Hemoptysis, history hypertension, end-stage renal disease on dialysis, smoking, metastatic renal cell carcinoma to bone  EXAM: CHEST  2  VIEW  COMPARISON:  07/31/2013; correlation CT chest 10/06/2014  FINDINGS: Enlargement of cardiac silhouette.  Mediastinal contours and pulmonary vascularity normal.  Atherosclerotic calcification aorta.  Mild bronchitic changes.  Lungs clear.  No infiltrate, pleural effusion or pneumothorax.  Old healed fracture lateral RIGHT sixth rib.  IMPRESSION: Mild enlargement of cardiac silhouette.  Bronchitic changes without infiltrate.   Electronically Signed   By: Lavonia Dana M.D.   On: 03/26/2015 12:59      MDM   Final diagnoses:  Infection, dialysis vascular access, initial encounter  Anemia, unspecified anemia type    The patient has a possible infected fistula, per report they had purulent material in the access to the fistula. Will discussed with internal medicine, vascular surgery, I do not see an obvious abscess on my exam, antibodies, has a new anemia of 3 g lower than baseline, leukocytosis, chest x-ray negative.  Care was discussed with the vascular surgeon, Dr. Scot Dock who will admit the patient to perform surgery, discussed with hospitalist, they will not need to be admitted according to the vascular surgeon.  Noemi Chapel, MD 03/26/15 [phone]

## 2015-03-26 NOTE — Op Note (Signed)
    NAME: [name]    MRN: [id number] DOB: 1951/08/14    DATE OF OPERATION: 03/26/2015  PREOP DIAGNOSIS: eschar overlying right brachiocephalic AV fistula  POSTOP DIAGNOSIS: same  PROCEDURE: exploration and repair of right brachiocephalic AV fistula  SURGEON: Judeth Cornfield. Scot Dock, MD, FACS  ASSIST: none  ANESTHESIA: Gen.   EBL: minimal  INDICATIONS: [name] is a 64 y.o. male who was sent to the emergency department by the dialysis Center because he had an eschar overlying his right brachiocephalic AV fistula. I felt that this was at risk for bleeding and therefore recommended exploration and repair.  FINDINGS: an ellipse of tissue was excised and the compromised area of the fistula was plicated.  TECHNIQUE: The patient was taken to the operative room and received a general anesthetic. Right upper extremity was prepped and draped in usual sterile fashion. An elliptical incision was made encompassing the eschar and the fistula was dissected free from the surrounding tissue. I mobilized enough of the skin flaps circumferentially to allow closure of the wound. It was an area of the fistula that was stuck to the eschar didn't look to be at risk for bleeding. I plicated this with a horizontal mattress 5-0 proline suture. I then closed the wound with interrupted 3-0 nylon's. A sterile dressing was applied. The patient tolerated the procedure well and was transferred to the recovery room in stable condition. All needle and sponge counts were correct.   Deitra Mayo, MD, FACS [facility]  DATE OF DICTATION:   03/26/2015

## 2015-03-26 NOTE — ED Notes (Signed)
Kidney doctor called and said that his right upper arm HD graft is infected, reports cough with blood in it. Pt is a cancer patient from kidney and lung.  Pt is on a chemo tablet for treatment.  Last HD was today.

## 2015-03-26 NOTE — Anesthesia Postprocedure Evaluation (Signed)
Anesthesia Post Note  Patient: [name]  Procedure(s) Performed: Procedure(s) (LRB): Exploration and Repair RIGHT Arm Arteriovenous  FISTULA  (Right)  Anesthesia type: general  Patient location: PACU  Post pain: Pain level controlled  Post assessment: Patient's Cardiovascular Status Stable  Last Vitals:  Filed Vitals:   03/26/15 1745  BP: 124/66  Pulse:   Temp:   Resp: 19    Post vital signs: Reviewed and stable  Level of consciousness: sedated  Complications: No apparent anesthesia complications

## 2015-03-26 NOTE — Discharge Instructions (Addendum)
Dialysis can stick above or below sutured area, not on sutured area per Dr. Scot Dock.

## 2015-03-26 NOTE — Progress Notes (Signed)
ANTIBIOTIC CONSULT NOTE - INITIAL  Pharmacy Consult for vanc/ceftaz Indication: bacteremia? (no BC+ charted)  No Known Allergies  Patient Measurements: Weight: 124 lb 6 oz (56.416 kg)  Vital Signs: Temp: 98 F (36.7 C) (06/23 1141) Temp Source: Oral (06/23 1141) BP: 118/95 mmHg (06/23 1407) Pulse Rate: 102 (06/23 1407) Intake/Output from previous day:   Intake/Output from this shift:    Labs:  Recent Labs  03/26/15 1156  WBC 11.8*  HGB 8.3*  PLT 309  CREATININE 1.84*   Estimated Creatinine Clearance: 33.2 mL/min (by C-G formula based on Cr of 1.84). No results for input(s): VANCOTROUGH, VANCOPEAK, VANCORANDOM, GENTTROUGH, GENTPEAK, GENTRANDOM, TOBRATROUGH, TOBRAPEAK, TOBRARND, AMIKACINPEAK, AMIKACINTROU, AMIKACIN in the last 72 hours.   Microbiology: No results found for this or any previous visit (from the past 720 hour(s)).  Medical History: Past Medical History  Diagnosis Date  . Renal disorder   . Hypertension   . Dialysis patient   . Secondary hyperparathyroidism (of renal origin)     s/p parathyroidectomy  . Anemia due to chronic disease treated with erythropoietin   . Closed fracture of pubic ramus 08/2013    pathologic (metastatic renal cell ca)  . History of radiation therapy 08/08/13-08/21/13    L-1-L5, ,lt hip/pelvis,prox rt femur  . 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 Renal insufficiency     on dialysis x 10 yrs    Assessment: 16 yom with ESRD- HD to ED for coughing up blood, upper R arm HD access. Pharmacy consulted to dose vanc/ceftaz for bacteremia? (no BC positive inpatient. Afebrile, wbc 11.8 on admit. Will give one-time loading doses and f/u HD schedule for further abx maintenance doses.  6/23 vanc>> 6/23 ceftaz>>  6/23 BCx2>> 6/23 UC>>   Goal of Therapy:  Vancomycin trough level 15-20 mcg/ml  Plan:  Vanc 1250mg  IV load in ED  Ceftaz 2g IV x 1 dose  Monitor clinical progress, c/s, abx plan  F/u HD schedule for abx  maintenance doses   Elicia Lamp, PharmD Clinical Pharmacist - Resident Pager [phone] 03/26/2015 2:39 PM

## 2015-03-27 ENCOUNTER — Encounter (HOSPITAL_COMMUNITY): Payer: Self-pay | Admitting: Vascular Surgery

## 2015-03-27 DIAGNOSIS — T82898A Other specified complication of vascular prosthetic devices, implants and grafts, initial encounter: Secondary | ICD-10-CM | POA: Diagnosis not present

## 2015-03-27 NOTE — Telephone Encounter (Signed)
Patient has been discharged after HD graft revision.

## 2015-03-31 LAB — CULTURE, BLOOD (ROUTINE X 2)
CULTURE: NO GROWTH
Culture: NO GROWTH

## 2015-04-15 ENCOUNTER — Encounter (HOSPITAL_COMMUNITY): Payer: Self-pay

## 2015-04-15 ENCOUNTER — Other Ambulatory Visit (HOSPITAL_BASED_OUTPATIENT_CLINIC_OR_DEPARTMENT_OTHER): Payer: Medicare Other

## 2015-04-15 ENCOUNTER — Ambulatory Visit (HOSPITAL_COMMUNITY)
Admission: RE | Admit: 2015-04-15 | Discharge: 2015-04-15 | Disposition: A | Discharge status: Home / Self Care | Payer: Medicare Other | Source: Ambulatory Visit | Attending: Physician Assistant | Admitting: Physician Assistant

## 2015-04-15 ENCOUNTER — Telehealth: Payer: Self-pay | Admitting: *Deleted

## 2015-04-15 ENCOUNTER — Other Ambulatory Visit: Payer: Self-pay

## 2015-04-15 ENCOUNTER — Inpatient Hospital Stay (HOSPITAL_COMMUNITY)
Admission: EM | Admit: 2015-04-15 | Discharge: 2015-04-16 | DRG: 811 | Disposition: A | Discharge status: Home / Self Care | Payer: Medicare Other | Attending: Internal Medicine | Admitting: Internal Medicine

## 2015-04-15 DIAGNOSIS — D631 Anemia in chronic kidney disease: Secondary | ICD-10-CM | POA: Diagnosis not present

## 2015-04-15 DIAGNOSIS — N19 Unspecified kidney failure: Secondary | ICD-10-CM

## 2015-04-15 DIAGNOSIS — R531 Weakness: Secondary | ICD-10-CM | POA: Insufficient documentation

## 2015-04-15 DIAGNOSIS — Z681 Body mass index (BMI) 19 or less, adult: Secondary | ICD-10-CM | POA: Diagnosis not present

## 2015-04-15 DIAGNOSIS — R590 Localized enlarged lymph nodes: Secondary | ICD-10-CM | POA: Insufficient documentation

## 2015-04-15 DIAGNOSIS — J90 Pleural effusion, not elsewhere classified: Secondary | ICD-10-CM

## 2015-04-15 DIAGNOSIS — I2510 Atherosclerotic heart disease of native coronary artery without angina pectoris: Secondary | ICD-10-CM

## 2015-04-15 DIAGNOSIS — R34 Anuria and oliguria: Secondary | ICD-10-CM | POA: Diagnosis present

## 2015-04-15 DIAGNOSIS — Z08 Encounter for follow-up examination after completed treatment for malignant neoplasm: Secondary | ICD-10-CM

## 2015-04-15 DIAGNOSIS — D6481 Anemia due to antineoplastic chemotherapy: Secondary | ICD-10-CM | POA: Diagnosis present

## 2015-04-15 DIAGNOSIS — C649 Malignant neoplasm of unspecified kidney, except renal pelvis: Secondary | ICD-10-CM

## 2015-04-15 DIAGNOSIS — B1920 Unspecified viral hepatitis C without hepatic coma: Secondary | ICD-10-CM | POA: Diagnosis present

## 2015-04-15 DIAGNOSIS — C642 Malignant neoplasm of left kidney, except renal pelvis: Secondary | ICD-10-CM | POA: Insufficient documentation

## 2015-04-15 DIAGNOSIS — C7951 Secondary malignant neoplasm of bone: Secondary | ICD-10-CM | POA: Diagnosis not present

## 2015-04-15 DIAGNOSIS — Z9221 Personal history of antineoplastic chemotherapy: Secondary | ICD-10-CM | POA: Insufficient documentation

## 2015-04-15 DIAGNOSIS — F17210 Nicotine dependence, cigarettes, uncomplicated: Secondary | ICD-10-CM | POA: Diagnosis present

## 2015-04-15 DIAGNOSIS — E43 Unspecified severe protein-calorie malnutrition: Secondary | ICD-10-CM | POA: Diagnosis present

## 2015-04-15 DIAGNOSIS — Z923 Personal history of irradiation: Secondary | ICD-10-CM

## 2015-04-15 DIAGNOSIS — Z85528 Personal history of other malignant neoplasm of kidney: Secondary | ICD-10-CM

## 2015-04-15 DIAGNOSIS — C7800 Secondary malignant neoplasm of unspecified lung: Secondary | ICD-10-CM

## 2015-04-15 DIAGNOSIS — N189 Chronic kidney disease, unspecified: Secondary | ICD-10-CM | POA: Diagnosis not present

## 2015-04-15 DIAGNOSIS — D649 Anemia, unspecified: Secondary | ICD-10-CM

## 2015-04-15 DIAGNOSIS — K219 Gastro-esophageal reflux disease without esophagitis: Secondary | ICD-10-CM | POA: Diagnosis present

## 2015-04-15 DIAGNOSIS — N2581 Secondary hyperparathyroidism of renal origin: Secondary | ICD-10-CM | POA: Diagnosis present

## 2015-04-15 DIAGNOSIS — N186 End stage renal disease: Secondary | ICD-10-CM

## 2015-04-15 DIAGNOSIS — I10 Essential (primary) hypertension: Secondary | ICD-10-CM | POA: Diagnosis not present

## 2015-04-15 DIAGNOSIS — Z992 Dependence on renal dialysis: Secondary | ICD-10-CM | POA: Insufficient documentation

## 2015-04-15 DIAGNOSIS — I120 Hypertensive chronic kidney disease with stage 5 chronic kidney disease or end stage renal disease: Secondary | ICD-10-CM | POA: Diagnosis present

## 2015-04-15 DIAGNOSIS — R911 Solitary pulmonary nodule: Secondary | ICD-10-CM | POA: Insufficient documentation

## 2015-04-15 LAB — IRON AND TIBC
Iron: 21 ug/dL — ABNORMAL LOW (ref 45–182)
Saturation Ratios: 13 % — ABNORMAL LOW (ref 17.9–39.5)
TIBC: 164 ug/dL — ABNORMAL LOW (ref 250–450)
UIBC: 143 ug/dL

## 2015-04-15 LAB — CBC WITH DIFFERENTIAL/PLATELET
BASO%: 0.1 % (ref 0.0–2.0)
Basophils Absolute: 0 10*3/uL (ref 0.0–0.1)
EOS ABS: 0.1 10*3/uL (ref 0.0–0.5)
EOS%: 0.8 % (ref 0.0–7.0)
HEMATOCRIT: 25.1 % — AB (ref 38.4–49.9)
HGB: 7.7 g/dL — ABNORMAL LOW (ref 13.0–17.1)
LYMPH#: 0.9 10*3/uL (ref 0.9–3.3)
LYMPH%: 8.8 % — ABNORMAL LOW (ref 14.0–49.0)
MCH: 19.7 pg — ABNORMAL LOW (ref 27.2–33.4)
MCHC: 30.7 g/dL — ABNORMAL LOW (ref 32.0–36.0)
MCV: 64.4 fL — AB (ref 79.3–98.0)
MONO#: 1.2 10*3/uL — AB (ref 0.1–0.9)
MONO%: 12.1 % (ref 0.0–14.0)
NEUT#: 7.9 10*3/uL — ABNORMAL HIGH (ref 1.5–6.5)
NEUT%: 78.2 % — ABNORMAL HIGH (ref 39.0–75.0)
NRBC: 1 % — AB (ref 0–0)
Platelets: 255 10*3/uL (ref 140–400)
RBC: 3.9 10*6/uL — ABNORMAL LOW (ref 4.20–5.82)
RDW: 20.2 % — AB (ref 11.0–14.6)
WBC: 10.1 10*3/uL (ref 4.0–10.3)

## 2015-04-15 LAB — TECHNOLOGIST REVIEW: Technologist Review: 3

## 2015-04-15 LAB — RETICULOCYTES
RBC.: 3.55 MIL/uL — ABNORMAL LOW (ref 4.22–5.81)
Retic Count, Absolute: 92.3 10*3/uL (ref 19.0–186.0)
Retic Ct Pct: 2.6 % (ref 0.4–3.1)

## 2015-04-15 LAB — FERRITIN: Ferritin: 1637 ng/mL — ABNORMAL HIGH (ref 24–336)

## 2015-04-15 LAB — POC URINE PREG, ED: Preg Test, Ur: NEGATIVE

## 2015-04-15 LAB — COMPREHENSIVE METABOLIC PANEL (CC13)
ALK PHOS: 140 U/L (ref 40–150)
ALT: 24 U/L (ref 0–55)
AST: 51 U/L — ABNORMAL HIGH (ref 5–34)
Albumin: 2.1 g/dL — ABNORMAL LOW (ref 3.5–5.0)
Anion Gap: 13 mEq/L — ABNORMAL HIGH (ref 3–11)
BUN: 23.9 mg/dL (ref 7.0–26.0)
CO2: 30 mEq/L — ABNORMAL HIGH (ref 22–29)
Calcium: 10 mg/dL (ref 8.4–10.4)
Chloride: 97 mEq/L — ABNORMAL LOW (ref 98–109)
Creatinine: 4.1 mg/dL (ref 0.7–1.3)
EGFR: 17 mL/min/{1.73_m2} — ABNORMAL LOW (ref >90)
GLUCOSE: 98 mg/dL (ref 70–140)
Potassium: 4.1 mEq/L (ref 3.5–5.1)
Sodium: 139 mEq/L (ref 136–145)
Total Bilirubin: 0.46 mg/dL (ref 0.20–1.20)
Total Protein: 8 g/dL (ref 6.4–8.3)

## 2015-04-15 LAB — PREPARE RBC (CROSSMATCH)

## 2015-04-15 LAB — VITAMIN B12: Vitamin B-12: 514 pg/mL (ref 180–914)

## 2015-04-15 MED ORDER — CALCIUM CARBONATE ANTACID 500 MG PO CHEW: 3.0000 | CHEWABLE_TABLET | Freq: Every day | ORAL | Status: DC | PRN

## 2015-04-15 MED ORDER — EVEROLIMUS 7.5 MG PO TABS: 7.5000 mg | ORAL_TABLET | Freq: Every day | ORAL | Status: DC

## 2015-04-15 MED ORDER — HYDROCERIN EX CREA
TOPICAL_CREAM | CUTANEOUS | Status: DC | PRN
  Filled 2015-04-15: qty 113

## 2015-04-15 MED ORDER — DIPHENOXYLATE-ATROPINE 2.5-0.025 MG PO TABS: 1.0000 | ORAL_TABLET | Freq: Three times a day (TID) | ORAL | Status: DC | PRN

## 2015-04-15 MED ORDER — CYCLOBENZAPRINE HCL 10 MG PO TABS: 10.0000 mg | ORAL_TABLET | Freq: Three times a day (TID) | ORAL | Status: DC | PRN

## 2015-04-15 MED ORDER — DOXEPIN HCL 25 MG PO CAPS
25.0000 mg | ORAL_CAPSULE | Freq: Every day | ORAL | Status: DC
  Administered 2015-04-16: 25 mg via ORAL
  Filled 2015-04-15 (×2): qty 1

## 2015-04-15 MED ORDER — EUCERIN EX CREA: 1.0000 "application " | TOPICAL_CREAM | CUTANEOUS | Status: DC | PRN

## 2015-04-15 MED ORDER — TRAMADOL HCL 50 MG PO TABS: 50.0000 mg | ORAL_TABLET | Freq: Two times a day (BID) | ORAL | Status: DC | PRN

## 2015-04-15 MED ORDER — SODIUM CHLORIDE 0.9 % IJ SOLN
3.0000 mL | Freq: Two times a day (BID) | INTRAMUSCULAR | Status: DC
  Administered 2015-04-15: 3 mL via INTRAVENOUS

## 2015-04-15 MED ORDER — ALPRAZOLAM 0.25 MG PO TABS
0.2500 mg | ORAL_TABLET | Freq: Two times a day (BID) | ORAL | Status: DC | PRN
Start: 2015-04-15 — End: 2015-04-16

## 2015-04-15 MED ORDER — LOSARTAN POTASSIUM 50 MG PO TABS
100.0000 mg | ORAL_TABLET | Freq: Every day | ORAL | Status: DC
  Administered 2015-04-16 (×2): 100 mg via ORAL
  Filled 2015-04-15 (×2): qty 2

## 2015-04-15 MED ORDER — PROCHLORPERAZINE MALEATE 10 MG PO TABS
10.0000 mg | ORAL_TABLET | Freq: Three times a day (TID) | ORAL | Status: DC | PRN
  Filled 2015-04-15: qty 1

## 2015-04-15 MED ORDER — ACETAMINOPHEN 650 MG RE SUPP: 650.0000 mg | Freq: Four times a day (QID) | RECTAL | Status: DC | PRN

## 2015-04-15 MED ORDER — ACETAMINOPHEN 325 MG PO TABS: 650.0000 mg | ORAL_TABLET | Freq: Four times a day (QID) | ORAL | Status: DC | PRN

## 2015-04-15 NOTE — Telephone Encounter (Signed)
Daughter shurane called on 04/13/15 to say  patient's hgb was 6.9. Wants to know if he needs a blood transfusion? i called the dialysis center on [address] and spoke with a nurse. She stated that his labs came back after the patient had gone home, on 04/09/15. shurane was notified and  instructed to take him to the [facility] For a blood transfusion. shurane told her he was sleeping and when he wakes he will eat a big dinner. Daughter here today with patient for labs and CT scan. Wants to know if we will give him blood today. Instructed her to have labs, get CT and report to the [facility] For evaluation.

## 2015-04-15 NOTE — ED Notes (Signed)
Informed PA Jaffery of POC Occult blood results was negative @ 1812 by QA.  Information is not crossing over.

## 2015-04-15 NOTE — Progress Notes (Signed)
Pt arrived to unit via care link. Vital signs stable. Will continue to monitor. Kennieth Francois, RN

## 2015-04-15 NOTE — ED Notes (Addendum)
Pt is cancer pt - kidney.  Goes to dialysis.  Pt seen Thursday and had labs.  Called Saturday to tell him that his hbg was 6.2.  Told to come to ED IF he had difficulty breathing.  At that time, he seemed ok.  Today c/o SOB, decreased appetite, fatigue.  Pt had CT scan Chest and labs drawn today here.  Thought he should get checked out while he was in facility.

## 2015-04-15 NOTE — ED Notes (Signed)
Bed: WA22 Expected date:  Expected time:  Means of arrival:  Comments: Triage 3  

## 2015-04-15 NOTE — H&P (Addendum)
[facility] History and Physical  Toris Laverdiere LTJ:[id number] DOB: [birth year] DOA: 04/15/2015  Referring physician: Dr Noemi Chapel PCP: sees Dr [name]  Primary nephrologist: Dr. 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 Patient is a poor historian and most of the history is open from ED physician's note. Patient has been diagnosed of metastatic renal cell carcinoma with bony metastases which was diagnosed in October 2014 and underwent Chemotherapy off and on since 08/2013. He is currently on Afinitor 7.5 mg by mouth daily beginning 06/23/2014. patient reports that for past few weeks he has been increasingly weak and unable to perform his routine activity. This morning he had great difficulty going to the bathroom and had to call his sister for help. Also reports some dyspnea on exertion but denies any chest pain, palpitations, orthopnea or PND.Marland Kitchen Denies hemoptysis, hematemesis ,malena or blood per rectum. .Patient denies headache, dizziness, fever, chills, nausea , vomiting, abdominal pain, bowel symptoms. He is anuric. Denies change in weight or appetite.  Course in the ED Patient's vitals were stable. Blood will done earlier showed hemoglobin of 7.7 and significant drop from baseline of around 11-12. Chemistry showed sodium of 139, potassium 4.1, BUN of 23.9 creatinine 4.1. Stool for occult blood in the ED was negative. CT of the chest, abdomen and pelvis with contrast done as outpatient today is resulted as below. Hospitalist admission requested for  symptomatically anemia and transfusion.  Review of Systems:  As outlined in history of present illness. 12 point review of systems otherwise unremarkable.   Past Medical History  Diagnosis Date  . Renal disorder   . Hypertension   . Dialysis patient   . Secondary hyperparathyroidism (of renal origin)     s/p parathyroidectomy  . Anemia due to chronic disease treated with erythropoietin   . Closed fracture of pubic ramus 08/2013    pathologic (metastatic renal cell ca)  . History of radiation therapy 08/08/13-08/21/13    L-1-L5, ,lt hip/pelvis,prox rt femur  . 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 Renal insufficiency     on dialysis x 10 yrs   Past Surgical History  Procedure Laterality Date  . Av fistula placement    . Revison of arteriovenous fistula Right 03/26/2015    Procedure: Exploration and Repair RIGHT Arm Arteriovenous  FISTULA ;  Surgeon: Angelia Mould, MD;  Location: [facility];  Service: Vascular;  Laterality: Right;   Social History:  reports that he has been smoking Cigarettes.  He has a 15 pack-year smoking history. He uses smokeless tobacco. He reports that he does not drink alcohol. His drug history is not on file.  No Known Allergies  Family History  Problem Relation Age of Onset  . Lung cancer      Prior to Admission medications   Medication Sig Start Date End Date Taking? Authorizing Provider  AFINITOR 7.5 MG tablet Take 1 tablet by mouth daily. 07/10/14  Yes Historical Provider, MD  ALPRAZolam Duanne Moron) 0.25 MG tablet Take 1 tablet (0.25 mg total) by mouth 2 (two) times daily as needed. Patient taking differently: Take 0.25 mg by mouth 2 (two) times daily  as needed for anxiety.  03/20/15  Yes [name], MD  calcium carbonate (TUMS - DOSED IN MG ELEMENTAL CALCIUM) 500 MG chewable tablet Chew 3 tablets by mouth daily as needed for heartburn.    Yes Historical Provider, MD  cyclobenzaprine (FLEXERIL) 10 MG tablet TAKE 1 TABLET BY MOUTH THREE  TIMES DAILY IF NEEDED FOR MUSCLE SPASM 01/26/15  Yes [name], MD  diphenoxylate-atropine (LOMOTIL) 2.5-0.025 MG per tablet Take 1 tablet by mouth 3 (three) times daily as needed for diarrhea or loose stools.   Yes Historical Provider, MD  doxepin (SINEQUAN) 25 MG capsule Take 25 mg by mouth at bedtime.   Yes Historical Provider, MD  losartan (COZAAR) 100 MG tablet Take 100 mg by mouth daily. 04/07/15  Yes Historical Provider, MD  prochlorperazine (COMPAZINE) 10 MG tablet Take 1 tablet (10 mg total) by mouth every 8 (eight) hours as needed for nausea or vomiting. 01/26/15  Yes [name], MD  Skin Protectants, Misc. (EUCERIN) cream Apply 1 application topically as needed for dry skin.   Yes Historical Provider, MD  traMADol (ULTRAM) 50 MG tablet Take 1 tablet by mouth no more than twice a day as needed for pain 02/13/15  Yes Adrena E Johnson, PA-C  ferrous sulfate 325 (65 FE) MG EC tablet Take 1 tablet (325 mg total) by mouth 2 (two) times daily with breakfast and lunch. Patient not taking: Reported on 04/15/2015 10/18/13   Concha Norway, MD  HYDROcodone-acetaminophen (NORCO/VICODIN) 5-325 MG per tablet Take 1-2 tablets by mouth 2 (two) times daily as needed. Patient not taking: Reported on 04/15/2015 03/20/15   [name], MD  oxyCODONE-acetaminophen (ROXICET) 5-325 MG per tablet Take 1-2 tablets by mouth every 4 (four) hours as needed for severe pain. Patient not taking: Reported on 04/15/2015 03/26/15   Angelia Mould, MD     Physical Exam:  Filed Vitals:   04/15/15 1218 04/15/15 1449 04/15/15 1508 04/15/15 1728  BP: 140/89 125/74 123/73 132/88  Pulse: 89  88 84  Temp: 98.5 F (36.9 C)     TempSrc: Oral     Resp: 16  16 18   SpO2: 93%  98% 100%    Constitutional: Vital signs reviewed.  Middle aged male in no acute distress HEENT: Pallor present, no icterus, moist oral mucosa, no cervical lymphadenopathy Cardiovascular: RRR, S1 normal, S2 normal, no MRG Chest: Diminished  bibasilar breath sounds, no rhonchi wheeze or crackles Abdominal: Soft. Non-tender, non-distended, bowel sounds are normal,  Ext: warm, no edema, right upper extremity AV graft  Neurological: A&O x3, non focal  Labs on Admission:  Basic Metabolic Panel:  Recent Labs Lab 04/15/15 1049  NA 139  K 4.1  CO2 30*  GLUCOSE 98  BUN 23.9  CREATININE 4.1*  CALCIUM 10.0   Liver Function Tests:  Recent Labs Lab 04/15/15 1049  AST 51*  ALT 24  ALKPHOS 140  BILITOT 0.46  PROT 8.0  ALBUMIN 2.1*   No results for input(s): LIPASE, AMYLASE in the last 168 hours. No results for input(s): AMMONIA in the last 168 hours. CBC:  Recent Labs Lab 04/15/15 1049  WBC 10.1  NEUTROABS 7.9*  HGB 7.7*  HCT 25.1*  MCV 64.4*  PLT 255   Cardiac Enzymes: No results for input(s): CKTOTAL, CKMB, CKMBINDEX, TROPONINI in the last 168 hours. BNP: Invalid input(s): POCBNP CBG: No results for input(s): GLUCAP in the last 168 hours.  Radiological Exams on Admission: Ct Abdomen Pelvis Wo Contrast  04/15/2015   CLINICAL DATA:  Metastatic left renal cell carcinoma. Bone metastasis 12/14. Dialysis patient. Previous radiation therapy and chemotherapy. Weakness for 2 weeks. Restaging.  EXAM: CT CHEST, ABDOMEN AND PELVIS WITHOUT CONTRAST  TECHNIQUE: Multidetector CT imaging of the chest, abdomen and pelvis was performed following the standard protocol without IV contrast.  COMPARISON:  10/06/2014  FINDINGS: CT CHEST FINDINGS  Mediastinum/Nodes: None 8 mm right axillary node, unchanged. Tortuous thoracic aorta, with atherosclerotic calcification within. Mild cardiomegaly, without pericardial effusion. Pulmonary artery enlargement, including at 3.4 cm outflow tract.  High right paratracheal node measures 1.3 cm today versus 9 mm on the prior.  Low right paratracheal 1.8 cm node on image 21 is unchanged.  Subcarinal node measures 1.6 cm today versus 8 mm on the prior. Hilar regions poorly evaluated without  intravenous contrast.  Subtle fluid level in the thoracic esophagus. No internal mammary adenopathy.  Lungs/Pleura: Small right and trace left pleural effusions are new. Mild motion degradation throughout. 3 mm right upper lobe pulmonary nodule on image 25 is not readily apparent on the prior exam.  The more posterior right upper lobe 5 mm nodule the prior exam may be obscured by atelectasis and pleural thickening today. Image 26 approximately.  9 mm right lower lobe pulmonary nodule on image 36 is new. Mosaic attenuation in both lungs, suspicious for mild venous congestion.  Musculoskeletal: Irregularity of posterior left ribs, likely posttraumatic. Similar expansion of sixth posterior lateral right rib which could be posttraumatic or metastatic.  Heterogeneous destruction involving the posterior medial right ninth rib. Surrounding soft tissue mass with involvement of the adjacent right transverse process. Primary felt to be similar. There may be mild increase in soft tissue extension posteriorly into the paraspinous musculature.  CT ABDOMEN AND PELVIS FINDINGS  Hepatobiliary: Normal liver. Cholecystectomy, without biliary ductal dilatation.  Pancreas: Pancreatic atrophy, without mass or duct dilatation.  Spleen: Normal  Adrenals/Urinary Tract: Normal adrenal glands. Bilateral renal atrophy with multiple masses of varying complexity. Soft tissue density mass within the interpolar left kidney anteriorly measures 4.3 x 3.7 cm today versus 4.1 x 3.7 cm on the prior. No hydronephrosis. Decompressed urinary bladder.  Stomach/Bowel: Normal stomach, without wall thickening. Colonic stool burden suggests constipation. Normal terminal ileum and appendix. Normal small bowel.  Vascular/Lymphatic: Aortic and branch vessel atherosclerosis. No retroperitoneal or retrocrural adenopathy. No pelvic sidewall adenopathy.  Reproductive: Mild prostatomegaly.  Other: No significant free fluid. Fat containing right femoral hernia is  small. No ascites. A nodule within the right perinephric space measures 5 mm on image 56 and is new.  Musculoskeletal: Extensive osseous metastasis. Right femoral neck lytic lesion measures 1.4 cm on image 118 and is similar. Index lesion within the posterior right iliac is lytic with a soft tissue component. 3.0 x 1.9 cm on image 90. Compare 2.9 x 1.8 cm on the prior. Lytic lesion within the L2 vertebral body with secondary compression deformity. Grossly similar. Posterior L5 vertebral body lytic lesion is similar in size at 1.7 cm. No vertebral body height loss at this level. Lytic lesions surround the left worse than right hips and are similar.  IMPRESSION: 1. New or progressive pulmonary metastasis. 2. Progressive mediastinal adenopathy, suspicious for metastatic disease. Developing CHF could partially also cause this enlargement. 3. New bilateral pleural effusions with mosaic attenuation in the lungs, suspicious for mild interstitial edema. 4. Similar to minimal progression of widespread osseous metastasis. 5. Minimal enlargement of left renal primary. A new right perirenal nodule for which metastatic  disease cannot be excluded. 6. Pulmonary artery enlargement suggests pulmonary arterial hypertension. 7.  Atherosclerosis, including within the coronary arteries.   Electronically Signed   By: Abigail Miyamoto M.D.   On: 04/15/2015 14:01   Ct Chest Wo Contrast  04/15/2015   CLINICAL DATA:  Metastatic left renal cell carcinoma. Bone metastasis 12/14. Dialysis patient. Previous radiation therapy and chemotherapy. Weakness for 2 weeks. Restaging.  EXAM: CT CHEST, ABDOMEN AND PELVIS WITHOUT CONTRAST  TECHNIQUE: Multidetector CT imaging of the chest, abdomen and pelvis was performed following the standard protocol without IV contrast.  COMPARISON:  10/06/2014  FINDINGS: CT CHEST FINDINGS  Mediastinum/Nodes: None 8 mm right axillary node, unchanged. Tortuous thoracic aorta, with atherosclerotic calcification within. Mild  cardiomegaly, without pericardial effusion. Pulmonary artery enlargement, including at 3.4 cm outflow tract.  High right paratracheal node measures 1.3 cm today versus 9 mm on the prior.  Low right paratracheal 1.8 cm node on image 21 is unchanged.  Subcarinal node measures 1.6 cm today versus 8 mm on the prior. Hilar regions poorly evaluated without intravenous contrast.  Subtle fluid level in the thoracic esophagus. No internal mammary adenopathy.  Lungs/Pleura: Small right and trace left pleural effusions are new. Mild motion degradation throughout. 3 mm right upper lobe pulmonary nodule on image 25 is not readily apparent on the prior exam.  The more posterior right upper lobe 5 mm nodule the prior exam may be obscured by atelectasis and pleural thickening today. Image 26 approximately.  9 mm right lower lobe pulmonary nodule on image 36 is new. Mosaic attenuation in both lungs, suspicious for mild venous congestion.  Musculoskeletal: Irregularity of posterior left ribs, likely posttraumatic. Similar expansion of sixth posterior lateral right rib which could be posttraumatic or metastatic.  Heterogeneous destruction involving the posterior medial right ninth rib. Surrounding soft tissue mass with involvement of the adjacent right transverse process. Primary felt to be similar. There may be mild increase in soft tissue extension posteriorly into the paraspinous musculature.  CT ABDOMEN AND PELVIS FINDINGS  Hepatobiliary: Normal liver. Cholecystectomy, without biliary ductal dilatation.  Pancreas: Pancreatic atrophy, without mass or duct dilatation.  Spleen: Normal  Adrenals/Urinary Tract: Normal adrenal glands. Bilateral renal atrophy with multiple masses of varying complexity. Soft tissue density mass within the interpolar left kidney anteriorly measures 4.3 x 3.7 cm today versus 4.1 x 3.7 cm on the prior. No hydronephrosis. Decompressed urinary bladder.  Stomach/Bowel: Normal stomach, without wall thickening.  Colonic stool burden suggests constipation. Normal terminal ileum and appendix. Normal small bowel.  Vascular/Lymphatic: Aortic and branch vessel atherosclerosis. No retroperitoneal or retrocrural adenopathy. No pelvic sidewall adenopathy.  Reproductive: Mild prostatomegaly.  Other: No significant free fluid. Fat containing right femoral hernia is small. No ascites. A nodule within the right perinephric space measures 5 mm on image 56 and is new.  Musculoskeletal: Extensive osseous metastasis. Right femoral neck lytic lesion measures 1.4 cm on image 118 and is similar. Index lesion within the posterior right iliac is lytic with a soft tissue component. 3.0 x 1.9 cm on image 90. Compare 2.9 x 1.8 cm on the prior. Lytic lesion within the L2 vertebral body with secondary compression deformity. Grossly similar. Posterior L5 vertebral body lytic lesion is similar in size at 1.7 cm. No vertebral body height loss at this level. Lytic lesions surround the left worse than right hips and are similar.  IMPRESSION: 1. New or progressive pulmonary metastasis. 2. Progressive mediastinal adenopathy, suspicious for metastatic disease. Developing CHF could partially also  cause this enlargement. 3. New bilateral pleural effusions with mosaic attenuation in the lungs, suspicious for mild interstitial edema. 4. Similar to minimal progression of widespread osseous metastasis. 5. Minimal enlargement of left renal primary. A new right perirenal nodule for which metastatic disease cannot be excluded. 6. Pulmonary artery enlargement suggests pulmonary arterial hypertension. 7.  Atherosclerosis, including within the coronary arteries.   Electronically Signed   By: Abigail Miyamoto M.D.   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  Ordered for 2 units PRBC transfusion in the ED.  Monitor for symptomatically improvement.  Active Problems:   ESRD on hemodialysis Transfer to [facility] for dialysis tomorrow. i have notified the nephrologist on-call Dr Posey Pronto.     Hypertension Stable. Continue Cozaar     Metastatic renal cell carcinoma to bone and new/ progressive pulmonary metastasis, progressive mediastinal adenopathy. Follows with [name]. new / progressive lung and mediastinal disease seen on CT today.will add him as a consult. Resume home pain meds.  B/l pleural effusion  Secondary to volume overload vs pulmonary mets. clinically appears euvolemic.HD in am.    GERD (gastroesophageal reflux disease)    Diet:cardiac  DVT prophylaxis: SCD   Code Status: Full code Family Communication: None at bedside Disposition Plan: Tx to [facility]  Clementeen Graham, [place] [facility] Pager [phone]  Total time spent on admission :60 minutes  If 7PM-7AM, please contact night-coverage [email or web address] Password [email or web address] 04/15/2015, 6:44 PM

## 2015-04-15 NOTE — ED Provider Notes (Signed)
The patient is a 64 year old male, history of end-stage renal disease on dialysis, also has a history of metastatic cancer. He presents with a complaint of anemia as reported to him. He has been feeling general weakness, occasional shortness of breath, on exam the patient has a soft nontender abdomen, no peripheral edema, conjunctiva appear clear, there is not appear to be any excessive pale appearing conjunctiva. Oropharynx is clear without bleeding, he denies blood in his stools. Please see rectal exam by physician assistant. Hemoglobin reviewed from medical record shows trending downwards and today closed to 7, likely in need of transfusion. Plan admission.  Medical screening examination/treatment/procedure(s) were conducted as a shared visit with non-physician practitioner(s) and myself.  I personally evaluated the patient during the encounter.  Clinical Impression:   Final diagnoses:  Anemia, unspecified anemia type  Kidney failure         Noemi Chapel, MD 04/17/15 0001

## 2015-04-15 NOTE — ED Notes (Signed)
Pt comes in here today stating "I need to get some blood." Pt states that while at dialysis they noted his hgb to be low. The pt came here today for some scans and lab work. Pt then felt it necessary to come to the ED and be evaluated.

## 2015-04-15 NOTE — ED Provider Notes (Signed)
CSN: [id number]     Arrival date & time 04/15/15  1209 History   First MD Initiated Contact with Patient 04/15/15 1451     Chief Complaint  Patient presents with  . Abnormal Lab    HPI   64 year old male with a history of metastatic left renal cell carcinoma, bone metastasis, dialysis patient presents today at the request of dialysis center for anemia. Pts family present at the time of evaluation the report patient has been on dialysis for greater than 15 years, uncertain etiology. He was incarcerated for a prolonged period of time they believe it is due to untreated hypertension. Patient was diagnosed with left renal cell carcinoma receiving chemotherapy and radiation in 2014. Patient is currently taking Afinitor daily, dialysis Tuesday Thursday Saturday. Patient received CT scans today for monitoring progression of bone metastasis, lung metastasis. Patient reports that for the last 2 weeks he's been short of breath, fatigue, can barely ambulate due to these. No history of significant anemia previously has never had a blood transfusion. Patient denies fever, cough, shortness of breath, orthopnea, chest pain, abdominal pain, lower extremity swelling or edema. No neurological complaints.   Past Medical History  Diagnosis Date  . Renal disorder   . Hypertension   . Dialysis patient   . Secondary hyperparathyroidism (of renal origin)     s/p parathyroidectomy  . Anemia due to chronic disease treated with erythropoietin   . Closed fracture of pubic ramus 08/2013    pathologic (metastatic renal cell ca)  . History of radiation therapy 08/08/13-08/21/13    L-1-L5, ,lt hip/pelvis,prox rt femur  . 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 Renal insufficiency     on dialysis x 10 yrs   Past Surgical History  Procedure Laterality Date  . Av fistula placement    . Revison of arteriovenous fistula Right 03/26/2015    Procedure: Exploration and Repair RIGHT Arm Arteriovenous  FISTULA ;   Surgeon: Angelia Mould, MD;  Location: [facility] OR;  Service: Vascular;  Laterality: Right;   Family History  Problem Relation Age of Onset  . Lung cancer     History  Substance Use Topics  . Smoking status: Current Every Day Smoker -- 1.00 packs/day for 15 years    Types: Cigarettes  . Smokeless tobacco: Current User  . Alcohol Use: No    Review of Systems  All other systems reviewed and are negative.   Allergies  Review of patient's allergies indicates no known allergies.  Home Medications   Prior to Admission medications   Medication Sig Start Date End Date Taking? Authorizing Provider  AFINITOR 7.5 MG tablet Take 1 tablet by mouth daily. 07/10/14  Yes Historical Provider, MD  ALPRAZolam Duanne Moron) 0.25 MG tablet Take 1 tablet (0.25 mg total) by mouth 2 (two) times daily as needed. Patient taking differently: Take 0.25 mg by mouth 2 (two) times daily as needed for anxiety.  03/20/15  Yes [name], MD  calcium carbonate (TUMS - DOSED IN MG ELEMENTAL CALCIUM) 500 MG chewable tablet Chew 3 tablets by mouth daily as needed for heartburn.    Yes Historical Provider, MD  cyclobenzaprine (FLEXERIL) 10 MG tablet TAKE 1 TABLET BY MOUTH THREE TIMES DAILY IF NEEDED FOR MUSCLE SPASM 01/26/15  Yes [name], MD  diphenoxylate-atropine (LOMOTIL) 2.5-0.025 MG per tablet Take 1 tablet by mouth 3 (three) times daily as needed for diarrhea or loose stools.   Yes Historical Provider, MD  doxepin (SINEQUAN) 25  MG capsule Take 25 mg by mouth at bedtime.   Yes Historical Provider, MD  losartan (COZAAR) 100 MG tablet Take 100 mg by mouth daily. 04/07/15  Yes Historical Provider, MD  prochlorperazine (COMPAZINE) 10 MG tablet Take 1 tablet (10 mg total) by mouth every 8 (eight) hours as needed for nausea or vomiting. 01/26/15  Yes [name], MD  Skin Protectants, Misc. (EUCERIN) cream Apply 1 application topically as needed for dry skin.   Yes Historical Provider, MD  traMADol (ULTRAM) 50 MG  tablet Take 1 tablet by mouth no more than twice a day as needed for pain 02/13/15  Yes Adrena E Johnson, PA-C  ferrous sulfate 325 (65 FE) MG EC tablet Take 1 tablet (325 mg total) by mouth 2 (two) times daily with breakfast and lunch. Patient not taking: Reported on 04/15/2015 10/18/13   Concha Norway, MD  HYDROcodone-acetaminophen (NORCO/VICODIN) 5-325 MG per tablet Take 1-2 tablets by mouth 2 (two) times daily as needed. Patient not taking: Reported on 04/15/2015 03/20/15   [name], MD  oxyCODONE-acetaminophen (ROXICET) 5-325 MG per tablet Take 1-2 tablets by mouth every 4 (four) hours as needed for severe pain. Patient not taking: Reported on 04/15/2015 03/26/15   Angelia Mould, MD   BP 123/79 mmHg  Pulse 91  Temp(Src) 98.8 F (37.1 C) (Oral)  Resp 16  SpO2 99%   Physical Exam  Constitutional: He is oriented to person, place, and time. He appears well-developed and well-nourished.  HENT:  Head: Normocephalic and atraumatic.  Eyes: Conjunctivae are normal. Pupils are equal, round, and reactive to light. Right eye exhibits no discharge. Left eye exhibits no discharge. No scleral icterus.  Neck: Normal range of motion. No JVD present. No tracheal deviation present.  Cardiovascular: Normal rate, regular rhythm, normal heart sounds and intact distal pulses.   Systolic murmur   Pulmonary/Chest: Effort normal. No stridor.  Abdominal: Soft. He exhibits no distension and no mass. There is no tenderness. There is no rebound and no guarding.  Musculoskeletal: Normal range of motion. He exhibits no edema or tenderness.  Av fistula right upper extremity, no signs of infection  Neurological: He is alert and oriented to person, place, and time. Coordination normal.  Skin: Skin is warm and dry.  Psychiatric: He has a normal mood and affect. His behavior is normal. Judgment and thought content normal.  Nursing note and vitals reviewed.   ED Course  Procedures (including critical care  time) Labs Review Labs Reviewed  RETICULOCYTES - Abnormal; Notable for the following:    RBC. 3.55 (*)    All other components within normal limits  VITAMIN B12  FOLATE  IRON AND TIBC  FERRITIN  POC OCCULT BLOOD, ED  TYPE AND SCREEN  ABO/RH  ANTIBODY SCREEN  PREPARE RBC (CROSSMATCH)    Imaging Review Ct Abdomen Pelvis Wo Contrast  04/15/2015   CLINICAL DATA:  Metastatic left renal cell carcinoma. Bone metastasis 12/14. Dialysis patient. Previous radiation therapy and chemotherapy. Weakness for 2 weeks. Restaging.  EXAM: CT CHEST, ABDOMEN AND PELVIS WITHOUT CONTRAST  TECHNIQUE: Multidetector CT imaging of the chest, abdomen and pelvis was performed following the standard protocol without IV contrast.  COMPARISON:  10/06/2014  FINDINGS: CT CHEST FINDINGS  Mediastinum/Nodes: None 8 mm right axillary node, unchanged. Tortuous thoracic aorta, with atherosclerotic calcification within. Mild cardiomegaly, without pericardial effusion. Pulmonary artery enlargement, including at 3.4 cm outflow tract.  High right paratracheal node measures 1.3 cm today versus 9 mm on the  prior.  Low right paratracheal 1.8 cm node on image 21 is unchanged.  Subcarinal node measures 1.6 cm today versus 8 mm on the prior. Hilar regions poorly evaluated without intravenous contrast.  Subtle fluid level in the thoracic esophagus. No internal mammary adenopathy.  Lungs/Pleura: Small right and trace left pleural effusions are new. Mild motion degradation throughout. 3 mm right upper lobe pulmonary nodule on image 25 is not readily apparent on the prior exam.  The more posterior right upper lobe 5 mm nodule the prior exam may be obscured by atelectasis and pleural thickening today. Image 26 approximately.  9 mm right lower lobe pulmonary nodule on image 36 is new. Mosaic attenuation in both lungs, suspicious for mild venous congestion.  Musculoskeletal: Irregularity of posterior left ribs, likely posttraumatic. Similar expansion of  sixth posterior lateral right rib which could be posttraumatic or metastatic.  Heterogeneous destruction involving the posterior medial right ninth rib. Surrounding soft tissue mass with involvement of the adjacent right transverse process. Primary felt to be similar. There may be mild increase in soft tissue extension posteriorly into the paraspinous musculature.  CT ABDOMEN AND PELVIS FINDINGS  Hepatobiliary: Normal liver. Cholecystectomy, without biliary ductal dilatation.  Pancreas: Pancreatic atrophy, without mass or duct dilatation.  Spleen: Normal  Adrenals/Urinary Tract: Normal adrenal glands. Bilateral renal atrophy with multiple masses of varying complexity. Soft tissue density mass within the interpolar left kidney anteriorly measures 4.3 x 3.7 cm today versus 4.1 x 3.7 cm on the prior. No hydronephrosis. Decompressed urinary bladder.  Stomach/Bowel: Normal stomach, without wall thickening. Colonic stool burden suggests constipation. Normal terminal ileum and appendix. Normal small bowel.  Vascular/Lymphatic: Aortic and branch vessel atherosclerosis. No retroperitoneal or retrocrural adenopathy. No pelvic sidewall adenopathy.  Reproductive: Mild prostatomegaly.  Other: No significant free fluid. Fat containing right femoral hernia is small. No ascites. A nodule within the right perinephric space measures 5 mm on image 56 and is new.  Musculoskeletal: Extensive osseous metastasis. Right femoral neck lytic lesion measures 1.4 cm on image 118 and is similar. Index lesion within the posterior right iliac is lytic with a soft tissue component. 3.0 x 1.9 cm on image 90. Compare 2.9 x 1.8 cm on the prior. Lytic lesion within the L2 vertebral body with secondary compression deformity. Grossly similar. Posterior L5 vertebral body lytic lesion is similar in size at 1.7 cm. No vertebral body height loss at this level. Lytic lesions surround the left worse than right hips and are similar.  IMPRESSION: 1. New or  progressive pulmonary metastasis. 2. Progressive mediastinal adenopathy, suspicious for metastatic disease. Developing CHF could partially also cause this enlargement. 3. New bilateral pleural effusions with mosaic attenuation in the lungs, suspicious for mild interstitial edema. 4. Similar to minimal progression of widespread osseous metastasis. 5. Minimal enlargement of left renal primary. A new right perirenal nodule for which metastatic disease cannot be excluded. 6. Pulmonary artery enlargement suggests pulmonary arterial hypertension. 7.  Atherosclerosis, including within the coronary arteries.   Electronically Signed   By: Abigail Miyamoto M.D.   On: 04/15/2015 14:01   Ct Chest Wo Contrast  04/15/2015   CLINICAL DATA:  Metastatic left renal cell carcinoma. Bone metastasis 12/14. Dialysis patient. Previous radiation therapy and chemotherapy. Weakness for 2 weeks. Restaging.  EXAM: CT CHEST, ABDOMEN AND PELVIS WITHOUT CONTRAST  TECHNIQUE: Multidetector CT imaging of the chest, abdomen and pelvis was performed following the standard protocol without IV contrast.  COMPARISON:  10/06/2014  FINDINGS: CT CHEST FINDINGS  Mediastinum/Nodes: None 8 mm right axillary node, unchanged. Tortuous thoracic aorta, with atherosclerotic calcification within. Mild cardiomegaly, without pericardial effusion. Pulmonary artery enlargement, including at 3.4 cm outflow tract.  High right paratracheal node measures 1.3 cm today versus 9 mm on the prior.  Low right paratracheal 1.8 cm node on image 21 is unchanged.  Subcarinal node measures 1.6 cm today versus 8 mm on the prior. Hilar regions poorly evaluated without intravenous contrast.  Subtle fluid level in the thoracic esophagus. No internal mammary adenopathy.  Lungs/Pleura: Small right and trace left pleural effusions are new. Mild motion degradation throughout. 3 mm right upper lobe pulmonary nodule on image 25 is not readily apparent on the prior exam.  The more posterior right  upper lobe 5 mm nodule the prior exam may be obscured by atelectasis and pleural thickening today. Image 26 approximately.  9 mm right lower lobe pulmonary nodule on image 36 is new. Mosaic attenuation in both lungs, suspicious for mild venous congestion.  Musculoskeletal: Irregularity of posterior left ribs, likely posttraumatic. Similar expansion of sixth posterior lateral right rib which could be posttraumatic or metastatic.  Heterogeneous destruction involving the posterior medial right ninth rib. Surrounding soft tissue mass with involvement of the adjacent right transverse process. Primary felt to be similar. There may be mild increase in soft tissue extension posteriorly into the paraspinous musculature.  CT ABDOMEN AND PELVIS FINDINGS  Hepatobiliary: Normal liver. Cholecystectomy, without biliary ductal dilatation.  Pancreas: Pancreatic atrophy, without mass or duct dilatation.  Spleen: Normal  Adrenals/Urinary Tract: Normal adrenal glands. Bilateral renal atrophy with multiple masses of varying complexity. Soft tissue density mass within the interpolar left kidney anteriorly measures 4.3 x 3.7 cm today versus 4.1 x 3.7 cm on the prior. No hydronephrosis. Decompressed urinary bladder.  Stomach/Bowel: Normal stomach, without wall thickening. Colonic stool burden suggests constipation. Normal terminal ileum and appendix. Normal small bowel.  Vascular/Lymphatic: Aortic and branch vessel atherosclerosis. No retroperitoneal or retrocrural adenopathy. No pelvic sidewall adenopathy.  Reproductive: Mild prostatomegaly.  Other: No significant free fluid. Fat containing right femoral hernia is small. No ascites. A nodule within the right perinephric space measures 5 mm on image 56 and is new.  Musculoskeletal: Extensive osseous metastasis. Right femoral neck lytic lesion measures 1.4 cm on image 118 and is similar. Index lesion within the posterior right iliac is lytic with a soft tissue component. 3.0 x 1.9 cm on  image 90. Compare 2.9 x 1.8 cm on the prior. Lytic lesion within the L2 vertebral body with secondary compression deformity. Grossly similar. Posterior L5 vertebral body lytic lesion is similar in size at 1.7 cm. No vertebral body height loss at this level. Lytic lesions surround the left worse than right hips and are similar.  IMPRESSION: 1. New or progressive pulmonary metastasis. 2. Progressive mediastinal adenopathy, suspicious for metastatic disease. Developing CHF could partially also cause this enlargement. 3. New bilateral pleural effusions with mosaic attenuation in the lungs, suspicious for mild interstitial edema. 4. Similar to minimal progression of widespread osseous metastasis. 5. Minimal enlargement of left renal primary. A new right perirenal nodule for which metastatic disease cannot be excluded. 6. Pulmonary artery enlargement suggests pulmonary arterial hypertension. 7.  Atherosclerosis, including within the coronary arteries.   Electronically Signed   By: Abigail Miyamoto M.D.   On: 04/15/2015 14:01     EKG Interpretation None      MDM   Final diagnoses:  Anemia, unspecified anemia type  Kidney failure    Labs:  B12, folate, iron, ferritin, reticulocyte, POC occult blood- neg occult blood  Imaging:  Consults:  Therapeutics:  Assessment:  Plan: Patient presents with anemia with hemoglobin of 7.7, 2 weeks ago 8.3. No history of blood transfusions or significant anemia. Patient had an elevated creatinine of 4.1. Recent CT scan was done for disease progression, results noted above. Patient has symptomatic anemia, requiring blood transfusion. No signs of active bleed, point care occult blood negative here, no signs of bruising. He will be admitted to [facility] for blood transfusion and dialysis. He is a Tuesday Thursday Saturday dialysis patient, reports that he was dialyzed successfully and Tuesday. She remained stable here in the ED, vital signs reassuring, hospital service  consult for hospital admission, and agreed to admit.       Okey Regal, PA-C 04/15/15 1857  Noemi Chapel, MD 04/17/15 0001

## 2015-04-16 ENCOUNTER — Other Ambulatory Visit: Payer: Self-pay | Admitting: *Deleted

## 2015-04-16 DIAGNOSIS — C7951 Secondary malignant neoplasm of bone: Principal | ICD-10-CM

## 2015-04-16 DIAGNOSIS — N189 Chronic kidney disease, unspecified: Secondary | ICD-10-CM

## 2015-04-16 DIAGNOSIS — Z992 Dependence on renal dialysis: Secondary | ICD-10-CM

## 2015-04-16 DIAGNOSIS — N186 End stage renal disease: Secondary | ICD-10-CM

## 2015-04-16 DIAGNOSIS — C649 Malignant neoplasm of unspecified kidney, except renal pelvis: Secondary | ICD-10-CM

## 2015-04-16 DIAGNOSIS — E43 Unspecified severe protein-calorie malnutrition: Secondary | ICD-10-CM

## 2015-04-16 DIAGNOSIS — D631 Anemia in chronic kidney disease: Secondary | ICD-10-CM

## 2015-04-16 DIAGNOSIS — D649 Anemia, unspecified: Secondary | ICD-10-CM

## 2015-04-16 LAB — BASIC METABOLIC PANEL
Anion gap: 13 (ref 5–15)
Anion gap: 13 (ref 5–15)
BUN: 27 mg/dL — ABNORMAL HIGH (ref 6–20)
BUN: 34 mg/dL — ABNORMAL HIGH (ref 6–20)
CALCIUM: 8.4 mg/dL — AB (ref 8.9–10.3)
CHLORIDE: 96 mmol/L — AB (ref 101–111)
CO2: 27 mmol/L (ref 22–32)
CO2: 25 mmol/L (ref 22–32)
Calcium: 8.4 mg/dL — ABNORMAL LOW (ref 8.9–10.3)
Chloride: 98 mmol/L — ABNORMAL LOW (ref 101–111)
Creatinine, Ser: 5.17 mg/dL — ABNORMAL HIGH (ref 0.61–1.24)
Creatinine, Ser: 5.74 mg/dL — ABNORMAL HIGH (ref 0.61–1.24)
GFR calc Af Amer: 13 mL/min — ABNORMAL LOW (ref >60)
GFR calc Af Amer: 11 mL/min — ABNORMAL LOW (ref >60)
GFR calc non Af Amer: 11 mL/min — ABNORMAL LOW (ref >60)
GFR calc non Af Amer: 10 mL/min — ABNORMAL LOW (ref >60)
GLUCOSE: 102 mg/dL — AB (ref 65–99)
Glucose, Bld: 105 mg/dL — ABNORMAL HIGH (ref 65–99)
Potassium: 3.8 mmol/L (ref 3.5–5.1)
Potassium: 3.9 mmol/L (ref 3.5–5.1)
Sodium: 136 mmol/L (ref 135–145)
Sodium: 136 mmol/L (ref 135–145)

## 2015-04-16 LAB — CBC
HEMATOCRIT: 20.5 % — AB (ref 39.0–52.0)
Hemoglobin: 6.2 g/dL — CL (ref 13.0–17.0)
MCH: 19.5 pg — ABNORMAL LOW (ref 26.0–34.0)
MCHC: 30.2 g/dL (ref 30.0–36.0)
MCV: 64.5 fL — ABNORMAL LOW (ref 78.0–100.0)
Platelets: 238 10*3/uL (ref 150–400)
RBC: 3.18 MIL/uL — ABNORMAL LOW (ref 4.22–5.81)
RDW: 20.4 % — ABNORMAL HIGH (ref 11.5–15.5)
WBC: 9.2 10*3/uL (ref 4.0–10.5)

## 2015-04-16 LAB — HEMOGLOBIN AND HEMATOCRIT, BLOOD
HCT: 32.5 % — ABNORMAL LOW (ref 39.0–52.0)
Hemoglobin: 10.9 g/dL — ABNORMAL LOW (ref 13.0–17.0)

## 2015-04-16 LAB — PREPARE RBC (CROSSMATCH)

## 2015-04-16 MED ORDER — CALCITRIOL 0.5 MCG PO CAPS
1.0000 ug | ORAL_CAPSULE | ORAL | Status: DC
  Administered 2015-04-16: 1 ug via ORAL
  Filled 2015-04-16: qty 2

## 2015-04-16 MED ORDER — SODIUM CHLORIDE 0.9 % IV SOLN: Freq: Once | INTRAVENOUS | Status: DC

## 2015-04-16 MED ORDER — HYDROCODONE-ACETAMINOPHEN 5-325 MG PO TABS: 1.0000 | ORAL_TABLET | Freq: Two times a day (BID) | ORAL | Status: DC | PRN

## 2015-04-16 MED ORDER — ENSURE ENLIVE PO LIQD: 237.0000 mL | Freq: Every day | ORAL | Status: DC

## 2015-04-16 MED ORDER — RENA-VITE PO TABS: 1.0000 | ORAL_TABLET | Freq: Every day | ORAL | Status: DC

## 2015-04-16 MED ORDER — NEPRO/CARBSTEADY PO LIQD: 237.0000 mL | Freq: Two times a day (BID) | ORAL | Status: AC

## 2015-04-16 MED ORDER — ALPRAZOLAM 0.25 MG PO TABS: 0.2500 mg | ORAL_TABLET | Freq: Two times a day (BID) | ORAL | Status: DC | PRN

## 2015-04-16 MED ORDER — NEPRO/CARBSTEADY PO LIQD
237.0000 mL | Freq: Two times a day (BID) | ORAL | Status: DC
  Administered 2015-04-16: 237 mL via ORAL

## 2015-04-16 MED ORDER — CYCLOBENZAPRINE HCL 10 MG PO TABS: ORAL_TABLET | ORAL | Status: AC

## 2015-04-16 NOTE — Progress Notes (Signed)
Hemodialysis- pt signed off Ama with 1 hour 24 minutes remaining, PA notified. Form placed in chart. Pt has no complaints. Just "tired of laying here".

## 2015-04-16 NOTE — Progress Notes (Signed)
CRITICAL VALUE ALERT  Critical value received: hemoglobin 6.2  Date of notification:  04-16-15  Time of notification:  0430  Critical value read back: yes  Nurse who received alert:  Patricia Nettle  MD notified (1st page): [place] NP ([facility])  Time of first page:  [phone]   MD notified (2nd page):  Time of second page:  Responding MD:  Belenda Cruise NP  Time MD responded:  [phone]  Patient to receive 2 units PRBC's. Type and Screen just posted, awaiting blood to be ready. Patient bp 109/69 with HR 89, 98% RA. Will continue to monitor the patient closely.

## 2015-04-16 NOTE — Telephone Encounter (Signed)
Refill: Xanax/Norco/flexeril

## 2015-04-16 NOTE — Discharge Summary (Addendum)
Discharge Summary  [name] WIO:[id number] DOB: 03/28/1951  PCP: [name], MD  Admit date: 04/15/2015 Discharge date: 04/16/2015  Time spent: 25 minutes  Recommendations for Outpatient Follow-up:  1. Patient will follow-up with oncology  Discharge Diagnoses:  Active Hospital Problems   Diagnosis Date Noted  . Symptomatic anemia 04/15/2015  . Protein-calorie malnutrition, severe 04/16/2015  . GERD (gastroesophageal reflux disease) 12/01/2013  . 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 Hepatitis C 08/01/2013  . Anemia in chronic kidney disease 07/31/2013  . ESRD on hemodialysis 07/31/2013  . Hypertension 07/31/2013    Resolved Hospital Problems   Diagnosis Date Noted Date Resolved  No resolved problems to display.    Discharge Condition: Improved, being discharged home  Diet recommendation: Renal diet with ensure and Nepro supplements  Filed Weights   04/16/15 0421 04/16/15 1206 04/16/15 1345  Weight: 54.704 kg (120 lb 9.6 oz) 55.6 kg (122 lb 9.2 oz) 55.1 kg (121 lb 7.6 oz)    History of present illness:  45 CT old male with past oral history of Left renal cell carcinoma with metastases to bone and lung plus end-stage renal disease sent over from oncology office to emergency room after noted low hemoglobin. In emergency room, hemoglobin at 7.7. Patient brought into the hospital service for follow-up and transfusion. No evidence of GI bleed.  Hospital Course:  Principal Problem:   Symptomatic anemia secondary to anemia of chronic kidney disease plus intermittent chemotherapy from renal cell carcinoma: By following morning, hemoglobin down to 6.2. Patient given 2 units packed red blood cells. Feeling better post transfusion.  Hemoglobin checked in the afternoon up to 10.9. Active Problems:   ESRD on hemodialysis: Nephrology consulted. Patient underwent hemodialysis on 7/14. After receiving partial HD, decided to stop dialysis with reason being he  was tired of being in the hospital.    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 Seen by nutrition. Started on Nepro shake by mouth twice a day plus and sure and live daily. Prescriptions given on discharge.   Procedures:  Status post 2 units packed red blood cells transfused 7/14  Hemodialysis partially down 7/14  Consultations:  Nephrology  Discharge Exam: BP 123/74 mmHg  Pulse 89  Temp(Src) 98.2 F (36.8 C) (Axillary)  Resp 18  Ht 5\' 7"  (1.702 m)  Wt 55.1 kg (121 lb 7.6 oz)  BMI 19.02 kg/m2  SpO2 99%  General: Alert and oriented 3, no acute distress Cardiovascular: Regular rate and rhythm, S1-S2 Respiratory: Clear to auscultation bilaterally  Discharge Instructions You were cared for by a hospitalist during your hospital stay. If you have any questions about your discharge medications or the care you received while you were in the hospital after you are discharged, you can call the unit and asked to speak with the hospitalist on call if the hospitalist that took care of you is not available. Once you are discharged, your primary care physician will handle any further medical issues. Please note that NO REFILLS for any discharge medications will be authorized once you are discharged, as it is imperative that you return to your primary care physician (or establish a relationship with a primary care physician if you do not have one) for your aftercare needs so that  they can reassess your need for medications and monitor your lab values.     Medication List    TAKE these medications        AFINITOR 7.5 MG tablet  Generic drug:  everolimus  Take 1 tablet by mouth daily.     ALPRAZolam 0.25 MG tablet    Commonly known as:  XANAX  Take 1 tablet (0.25 mg total) by mouth 2 (two) times daily as needed for anxiety.     calcium carbonate 500 MG chewable tablet  Commonly known as:  TUMS - dosed in mg elemental calcium  Chew 3 tablets by mouth daily as needed for heartburn.     cyclobenzaprine 10 MG tablet  Commonly known as:  FLEXERIL  TAKE 1 TABLET BY MOUTH THREE TIMES DAILY IF NEEDED FOR MUSCLE SPASM     diphenoxylate-atropine 2.5-0.025 MG per tablet  Commonly known as:  LOMOTIL  Take 1 tablet by mouth 3 (three) times daily as needed for diarrhea or loose stools.     doxepin 25 MG capsule  Commonly known as:  SINEQUAN  Take 25 mg by mouth at bedtime.     eucerin cream  Apply 1 application topically as needed for dry skin.     feeding supplement (ENSURE ENLIVE) Liqd  Take 237 mLs by mouth daily at 3 pm.     ferrous sulfate 325 (65 FE) MG EC tablet  Take 1 tablet (325 mg total) by mouth 2 (two) times daily with breakfast and lunch.     HYDROcodone-acetaminophen 5-325 MG per tablet  Commonly known as:  NORCO/VICODIN  Take 1-2 tablets by mouth 2 (two) times daily as needed.     losartan 100 MG tablet  Commonly known as:  COZAAR  Take 100 mg by mouth daily.     oxyCODONE-acetaminophen 5-325 MG per tablet  Commonly known as:  ROXICET  Take 1-2 tablets by mouth every 4 (four) hours as needed for severe pain.     prochlorperazine 10 MG tablet  Commonly known as:  COMPAZINE  Take 1 tablet (10 mg total) by mouth every 8 (eight) hours as needed for nausea or vomiting.     traMADol 50 MG tablet  Commonly known as:  ULTRAM  Take 1 tablet by mouth no more than twice a day as needed for pain       No Known Allergies    The results of significant diagnostics from this hospitalization (including imaging, microbiology, ancillary and laboratory) are listed below for reference.    Significant Diagnostic Studies: Ct Abdomen Pelvis Wo Contrast  04/15/2015   CLINICAL DATA:   Metastatic left renal cell carcinoma. Bone metastasis 12/14. Dialysis patient. Previous radiation therapy and chemotherapy. Weakness for 2 weeks. Restaging.  EXAM: CT CHEST, ABDOMEN AND PELVIS WITHOUT CONTRAST  TECHNIQUE: Multidetector CT imaging of the chest, abdomen and pelvis was performed following the standard protocol without IV contrast.  COMPARISON:  10/06/2014  FINDINGS: CT CHEST FINDINGS  Mediastinum/Nodes: None 8 mm right axillary node, unchanged. Tortuous thoracic aorta, with atherosclerotic calcification within. Mild cardiomegaly, without pericardial effusion. Pulmonary artery enlargement, including at 3.4 cm outflow tract.  High right paratracheal node measures 1.3 cm today versus 9 mm on the prior.  Low right paratracheal 1.8 cm node on image 21 is unchanged.  Subcarinal node measures 1.6 cm today versus 8 mm on the prior. Hilar regions poorly evaluated without intravenous contrast.  Subtle fluid level in the thoracic esophagus. No internal mammary adenopathy.  Lungs/Pleura: Small  right and trace left pleural effusions are new. Mild motion degradation throughout. 3 mm right upper lobe pulmonary nodule on image 25 is not readily apparent on the prior exam.  The more posterior right upper lobe 5 mm nodule the prior exam may be obscured by atelectasis and pleural thickening today. Image 26 approximately.  9 mm right lower lobe pulmonary nodule on image 36 is new. Mosaic attenuation in both lungs, suspicious for mild venous congestion.  Musculoskeletal: Irregularity of posterior left ribs, likely posttraumatic. Similar expansion of sixth posterior lateral right rib which could be posttraumatic or metastatic.  Heterogeneous destruction involving the posterior medial right ninth rib. Surrounding soft tissue mass with involvement of the adjacent right transverse process. Primary felt to be similar. There may be mild increase in soft tissue extension posteriorly into the paraspinous musculature.  CT ABDOMEN  AND PELVIS FINDINGS  Hepatobiliary: Normal liver. Cholecystectomy, without biliary ductal dilatation.  Pancreas: Pancreatic atrophy, without mass or duct dilatation.  Spleen: Normal  Adrenals/Urinary Tract: Normal adrenal glands. Bilateral renal atrophy with multiple masses of varying complexity. Soft tissue density mass within the interpolar left kidney anteriorly measures 4.3 x 3.7 cm today versus 4.1 x 3.7 cm on the prior. No hydronephrosis. Decompressed urinary bladder.  Stomach/Bowel: Normal stomach, without wall thickening. Colonic stool burden suggests constipation. Normal terminal ileum and appendix. Normal small bowel.  Vascular/Lymphatic: Aortic and branch vessel atherosclerosis. No retroperitoneal or retrocrural adenopathy. No pelvic sidewall adenopathy.  Reproductive: Mild prostatomegaly.  Other: No significant free fluid. Fat containing right femoral hernia is small. No ascites. A nodule within the right perinephric space measures 5 mm on image 56 and is new.  Musculoskeletal: Extensive osseous metastasis. Right femoral neck lytic lesion measures 1.4 cm on image 118 and is similar. Index lesion within the posterior right iliac is lytic with a soft tissue component. 3.0 x 1.9 cm on image 90. Compare 2.9 x 1.8 cm on the prior. Lytic lesion within the L2 vertebral body with secondary compression deformity. Grossly similar. Posterior L5 vertebral body lytic lesion is similar in size at 1.7 cm. No vertebral body height loss at this level. Lytic lesions surround the left worse than right hips and are similar.  IMPRESSION: 1. New or progressive pulmonary metastasis. 2. Progressive mediastinal adenopathy, suspicious for metastatic disease. Developing CHF could partially also cause this enlargement. 3. New bilateral pleural effusions with mosaic attenuation in the lungs, suspicious for mild interstitial edema. 4. Similar to minimal progression of widespread osseous metastasis. 5. Minimal enlargement of left  renal primary. A new right perirenal nodule for which metastatic disease cannot be excluded. 6. Pulmonary artery enlargement suggests pulmonary arterial hypertension. 7.  Atherosclerosis, including within the coronary arteries.   Electronically Signed   By: Abigail Miyamoto M.D.   On: 04/15/2015 14:01   Dg Chest 2 View  03/26/2015   CLINICAL DATA:  Hemoptysis, history hypertension, end-stage renal disease on dialysis, smoking, metastatic renal cell carcinoma to bone  EXAM: CHEST  2 VIEW  COMPARISON:  07/31/2013; correlation CT chest 10/06/2014  FINDINGS: Enlargement of cardiac silhouette.  Mediastinal contours and pulmonary vascularity normal.  Atherosclerotic calcification aorta.  Mild bronchitic changes.  Lungs clear.  No infiltrate, pleural effusion or pneumothorax.  Old healed fracture lateral RIGHT sixth rib.  IMPRESSION: Mild enlargement of cardiac silhouette.  Bronchitic changes without infiltrate.   Electronically Signed   By: Lavonia Dana M.D.   On: 03/26/2015 12:59   Ct Chest Wo Contrast  04/15/2015   CLINICAL  DATA:  Metastatic left renal cell carcinoma. Bone metastasis 12/14. Dialysis patient. Previous radiation therapy and chemotherapy. Weakness for 2 weeks. Restaging.  EXAM: CT CHEST, ABDOMEN AND PELVIS WITHOUT CONTRAST  TECHNIQUE: Multidetector CT imaging of the chest, abdomen and pelvis was performed following the standard protocol without IV contrast.  COMPARISON:  10/06/2014  FINDINGS: CT CHEST FINDINGS  Mediastinum/Nodes: None 8 mm right axillary node, unchanged. Tortuous thoracic aorta, with atherosclerotic calcification within. Mild cardiomegaly, without pericardial effusion. Pulmonary artery enlargement, including at 3.4 cm outflow tract.  High right paratracheal node measures 1.3 cm today versus 9 mm on the prior.  Low right paratracheal 1.8 cm node on image 21 is unchanged.  Subcarinal node measures 1.6 cm today versus 8 mm on the prior. Hilar regions poorly evaluated without intravenous  contrast.  Subtle fluid level in the thoracic esophagus. No internal mammary adenopathy.  Lungs/Pleura: Small right and trace left pleural effusions are new. Mild motion degradation throughout. 3 mm right upper lobe pulmonary nodule on image 25 is not readily apparent on the prior exam.  The more posterior right upper lobe 5 mm nodule the prior exam may be obscured by atelectasis and pleural thickening today. Image 26 approximately.  9 mm right lower lobe pulmonary nodule on image 36 is new. Mosaic attenuation in both lungs, suspicious for mild venous congestion.  Musculoskeletal: Irregularity of posterior left ribs, likely posttraumatic. Similar expansion of sixth posterior lateral right rib which could be posttraumatic or metastatic.  Heterogeneous destruction involving the posterior medial right ninth rib. Surrounding soft tissue mass with involvement of the adjacent right transverse process. Primary felt to be similar. There may be mild increase in soft tissue extension posteriorly into the paraspinous musculature.  CT ABDOMEN AND PELVIS FINDINGS  Hepatobiliary: Normal liver. Cholecystectomy, without biliary ductal dilatation.  Pancreas: Pancreatic atrophy, without mass or duct dilatation.  Spleen: Normal  Adrenals/Urinary Tract: Normal adrenal glands. Bilateral renal atrophy with multiple masses of varying complexity. Soft tissue density mass within the interpolar left kidney anteriorly measures 4.3 x 3.7 cm today versus 4.1 x 3.7 cm on the prior. No hydronephrosis. Decompressed urinary bladder.  Stomach/Bowel: Normal stomach, without wall thickening. Colonic stool burden suggests constipation. Normal terminal ileum and appendix. Normal small bowel.  Vascular/Lymphatic: Aortic and branch vessel atherosclerosis. No retroperitoneal or retrocrural adenopathy. No pelvic sidewall adenopathy.  Reproductive: Mild prostatomegaly.  Other: No significant free fluid. Fat containing right femoral hernia is small. No  ascites. A nodule within the right perinephric space measures 5 mm on image 56 and is new.  Musculoskeletal: Extensive osseous metastasis. Right femoral neck lytic lesion measures 1.4 cm on image 118 and is similar. Index lesion within the posterior right iliac is lytic with a soft tissue component. 3.0 x 1.9 cm on image 90. Compare 2.9 x 1.8 cm on the prior. Lytic lesion within the L2 vertebral body with secondary compression deformity. Grossly similar. Posterior L5 vertebral body lytic lesion is similar in size at 1.7 cm. No vertebral body height loss at this level. Lytic lesions surround the left worse than right hips and are similar.  IMPRESSION: 1. New or progressive pulmonary metastasis. 2. Progressive mediastinal adenopathy, suspicious for metastatic disease. Developing CHF could partially also cause this enlargement. 3. New bilateral pleural effusions with mosaic attenuation in the lungs, suspicious for mild interstitial edema. 4. Similar to minimal progression of widespread osseous metastasis. 5. Minimal enlargement of left renal primary. A new right perirenal nodule for which metastatic disease cannot be excluded.  6. Pulmonary artery enlargement suggests pulmonary arterial hypertension. 7.  Atherosclerosis, including within the coronary arteries.   Electronically Signed   By: Abigail Miyamoto M.D.   On: 04/15/2015 14:01    Microbiology: Recent Results (from the past 240 hour(s))  TECHNOLOGIST REVIEW     Status: None   Collection Time: 04/15/15 10:49 AM  Result Value Ref Range Status   Technologist Review 3% Myelos, target cells present  Final     Labs: Basic Metabolic Panel:  Recent Labs Lab 04/15/15 1049 04/16/15 0310 04/16/15 1215  NA 139 136 136  K 4.1 3.8 3.9  CL  --  96* 98*  CO2 30* 27 25  GLUCOSE 98 102* 105*  BUN 23.9 27* 34*  CREATININE 4.1* 5.17* 5.74*  CALCIUM 10.0 8.4* 8.4*   Liver Function Tests:  Recent Labs Lab 04/15/15 1049  AST 51*  ALT 24  ALKPHOS 140   BILITOT 0.46  PROT 8.0  ALBUMIN 2.1*   No results for input(s): LIPASE, AMYLASE in the last 168 hours. No results for input(s): AMMONIA in the last 168 hours. CBC:  Recent Labs Lab 04/15/15 1049 04/16/15 0310  WBC 10.1 9.2  NEUTROABS 7.9*  --   HGB 7.7* 6.2*  HCT 25.1* 20.5*  MCV 64.4* 64.5*  PLT 255 238   Cardiac Enzymes: No results for input(s): CKTOTAL, CKMB, CKMBINDEX, TROPONINI in the last 168 hours. BNP: BNP (last 3 results) No results for input(s): BNP in the last 8760 hours.  ProBNP (last 3 results) No results for input(s): PROBNP in the last 8760 hours.  CBG: No results for input(s): GLUCAP in the last 168 hours.     Signed:  Annita Brod  [facility] 04/16/2015, 3:48 PM

## 2015-04-16 NOTE — Progress Notes (Signed)
[name] to be D/C'd Home per MD order.  Discussed prescriptions and follow up appointments with the patient. Prescriptions given to patient, medication list explained in detail. Pt verbalized understanding.    Medication List    TAKE these medications        AFINITOR 7.5 MG tablet  Generic drug:  everolimus  Take 1 tablet by mouth daily.     ALPRAZolam 0.25 MG tablet  Commonly known as:  XANAX  Take 1 tablet (0.25 mg total) by mouth 2 (two) times daily as needed for anxiety.     calcium carbonate 500 MG chewable tablet  Commonly known as:  TUMS - dosed in mg elemental calcium  Chew 3 tablets by mouth daily as needed for heartburn.     cyclobenzaprine 10 MG tablet  Commonly known as:  FLEXERIL  TAKE 1 TABLET BY MOUTH THREE TIMES DAILY IF NEEDED FOR MUSCLE SPASM     diphenoxylate-atropine 2.5-0.025 MG per tablet  Commonly known as:  LOMOTIL  Take 1 tablet by mouth 3 (three) times daily as needed for diarrhea or loose stools.     doxepin 25 MG capsule  Commonly known as:  SINEQUAN  Take 25 mg by mouth at bedtime.     eucerin cream  Apply 1 application topically as needed for dry skin.     feeding supplement (ENSURE ENLIVE) Liqd  Take 237 mLs by mouth daily at 3 pm.     feeding supplement (NEPRO CARB STEADY) Liqd  Take 237 mLs by mouth 2 (two) times daily between meals.     ferrous sulfate 325 (65 FE) MG EC tablet  Take 1 tablet (325 mg total) by mouth 2 (two) times daily with breakfast and lunch.     HYDROcodone-acetaminophen 5-325 MG per tablet  Commonly known as:  NORCO/VICODIN  Take 1-2 tablets by mouth 2 (two) times daily as needed.     losartan 100 MG tablet  Commonly known as:  COZAAR  Take 100 mg by mouth daily.     oxyCODONE-acetaminophen 5-325 MG per tablet  Commonly known as:  ROXICET  Take 1-2 tablets by mouth every 4 (four) hours as needed for severe pain.     prochlorperazine 10 MG tablet  Commonly known as:  COMPAZINE  Take 1 tablet (10 mg  total) by mouth every 8 (eight) hours as needed for nausea or vomiting.     traMADol 50 MG tablet  Commonly known as:  ULTRAM  Take 1 tablet by mouth no more than twice a day as needed for pain        Filed Vitals:   04/16/15 1430  BP: 123/74  Pulse: 89  Temp: 98.2 F (36.8 C)  Resp: 18    Skin clean, dry and intact without evidence of skin break down, no evidence of skin tears noted. IV catheter discontinued intact. Site without signs and symptoms of complications. Dressing and pressure applied. Pt denies pain at this time. No complaints noted.  An After Visit Summary was printed and given to the patient. Patient escorted via [name], and D/C home via private auto.  [name] 04/16/2015 5:24 PM

## 2015-04-16 NOTE — Procedures (Signed)
  I was present at this dialysis session, have reviewed the session itself and made  appropriate changes Rob Jenissa Tyrell MD (pgr) [phone]    (c) [phone] 03/21/2015, 10:31 AM    

## 2015-04-16 NOTE — Progress Notes (Signed)
Initial Nutrition Assessment  DOCUMENTATION CODES:   Severe malnutrition in context of chronic illness  INTERVENTION:   Provide Nepro Shake po BID, each supplement provides 425 kcal and 19 grams protein.  Provide Ensure Enlive po once daily, each supplement provides 350 kcal and 20 grams of protein.  Encourage adequate PO intake.   NUTRITION DIAGNOSIS:   Malnutrition related to chronic illness as evidenced by severe depletion of body fat, severe depletion of muscle mass.  GOAL:   Patient will meet greater than or equal to 90% of their needs  MONITOR:   PO intake, Supplement acceptance, Weight trends, Labs, I & O's  REASON FOR ASSESSMENT:   Malnutrition Screening Tool    ASSESSMENT:   64 year old male with history metastatic left renal cell carcinoma (metastatic disease to bone with new pulmonary metastases and increased mediastinal lymphadenopathy on follow-up chest and abdominal CT today), end-stage renal disease on dialysis (Tu,th, sat), hypertension, GERD who was sent from oncology office for progressive drop in hemoglobin.  Pt reports having a decreased appetite which has been ongoing for more than 2 months. Pt reports however he still consumes at least 3 meals a day along with Nepro/Ensure shakes daily. Pt was unable to quantify amount of nutritional shakes usually consumed. Current meal completion is 10%. Pt endorses weight loss. Per Epic weight records, pt with a 7.7% weight loss in 2 months. Pt is agreeable to Nepro and Ensure. RD to order. Pt was encouraged to eat his food at meals and to drink his supplements.   Nutrition-Focused physical exam completed. Findings are severe fat depletion, moderate to severe muscle depletion, and no edema.   Labs: Low calcium and GFR. High BUN and creatinine.  Diet Order:  Diet renal with fluid restriction Fluid restriction:: 1200 mL Fluid; Room service appropriate?: Yes; Fluid consistency:: Thin  Skin:   (Incision on R  arm)  Last BM:  PTA  Height:   Ht Readings from Last 1 Encounters:  04/15/15 5\' 7"  (1.702 m)    Weight:   Wt Readings from Last 1 Encounters:  04/16/15 121 lb 7.6 oz (55.1 kg)    Ideal Body Weight:  67 kg  Wt Readings from Last 10 Encounters:  04/16/15 121 lb 7.6 oz (55.1 kg)  03/26/15 124 lb 6 oz (56.416 kg)  02/13/15 130 lb 1.6 oz (59.013 kg)  12/10/14 132 lb 3.2 oz (59.966 kg)  08/25/14 125 lb 3.2 oz (56.79 kg)  07/30/14 129 lb (58.514 kg)  06/11/14 125 lb 12.8 oz (57.063 kg)  04/18/14 123 lb 3.2 oz (55.883 kg)  03/07/14 123 lb 3.2 oz (55.883 kg)  01/22/14 119 lb 14.4 oz (54.386 kg)    BMI:  Body mass index is 19.02 kg/(m^2).  Estimated Nutritional Needs:   Kcal:  1900-2100  Protein:  85-100 grams  Fluid:  1.2L/day  EDUCATION NEEDS:   No education needs identified at this time  Corrin Parker, MS, RD, LDN Pager # [phone] After hours/ weekend pager # [phone]

## 2015-04-17 ENCOUNTER — Ambulatory Visit: Payer: Medicare Other | Admitting: Oncology

## 2015-04-17 LAB — TYPE AND SCREEN
ABO/RH(D): B POS
Antibody Screen: POSITIVE
DAT, IgG: NEGATIVE
Donor AG Type: NEGATIVE
Donor AG Type: NEGATIVE
Unit division: 0
Unit division: 0

## 2015-04-19 LAB — TYPE AND SCREEN
ABO/RH(D): B POS
Antibody Screen: POSITIVE
DAT, IgG: NEGATIVE
Donor AG Type: NEGATIVE
Donor AG Type: NEGATIVE
PT AG Type: NEGATIVE
Unit division: 0
Unit division: 0

## 2015-04-30 ENCOUNTER — Telehealth: Payer: Self-pay | Admitting: Oncology

## 2015-04-30 ENCOUNTER — Ambulatory Visit (HOSPITAL_BASED_OUTPATIENT_CLINIC_OR_DEPARTMENT_OTHER): Payer: Medicare Other | Admitting: Oncology

## 2015-04-30 ENCOUNTER — Encounter: Payer: Self-pay | Admitting: Oncology

## 2015-04-30 VITALS — BP 114/74 | HR 110 | Temp 99.8°F | Resp 18 | Ht 67.0 in

## 2015-04-30 DIAGNOSIS — Z992 Dependence on renal dialysis: Secondary | ICD-10-CM | POA: Diagnosis not present

## 2015-04-30 DIAGNOSIS — I10 Essential (primary) hypertension: Secondary | ICD-10-CM | POA: Diagnosis not present

## 2015-04-30 DIAGNOSIS — D631 Anemia in chronic kidney disease: Secondary | ICD-10-CM

## 2015-04-30 DIAGNOSIS — C649 Malignant neoplasm of unspecified kidney, except renal pelvis: Secondary | ICD-10-CM

## 2015-04-30 DIAGNOSIS — C642 Malignant neoplasm of left kidney, except renal pelvis: Secondary | ICD-10-CM | POA: Diagnosis present

## 2015-04-30 DIAGNOSIS — C7951 Secondary malignant neoplasm of bone: Secondary | ICD-10-CM | POA: Diagnosis not present

## 2015-04-30 DIAGNOSIS — N186 End stage renal disease: Secondary | ICD-10-CM

## 2015-04-30 MED ORDER — AXITINIB 5 MG PO TABS: 5.0000 mg | ORAL_TABLET | Freq: Every day | ORAL | Status: DC

## 2015-04-30 NOTE — Progress Notes (Signed)
[facility] OFF[facility] PROGRESS NOTE   DIAGNOSIS: 64 year old gentleman with Renal Cell Carcinoma with metastatic disease to the bone diagnosed in October 2014.  PRIOR THERAPY:  Sutent 50 mg daily on days 1-28, off on 29-42. Held as noted above and re-started on 11/22/2013. Discontinued on 06/11/2014 due to hepatotoxicity.  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  Seen 2 weeks before by PCP/Orthopedics with X-ray of hip and CXR revealing lytic sxpansile lesion with pathologic fracture, left inferior lesion.  Onc (Dr. Juliann Mule) consulted on 10/29; Rad onc (Dr. Lisbeth Renshaw) on 11/01   07/31/2013 Imaging CT C/A/P hyperemic 2.3 cm R paratracheal NL, smaller enlarged precarinal, subcarinal, pretracheal, adn prevascular LN, lesion in the proximal R 9th rib, smaller lytic lesion with path fracture of lateral R 6 th rib; 4.5 cm L kidney mass; 3 cm R kid mass   07/31/2013 Imaging MRI,  L hip on admission revealed a 6x6x5 cm homogeneous destructive soft tissue mass destroying the anterior medial and anterior superior aspects of the left acetabulum and lateral aspect of the left surperior pubic ramus.  2 cm mass in the post L5.   08/02/2013 Procedure CT Guide biopsy of left pelci soft tissue mass.    08/05/2013 Pathology Results Metastatic renal cell carcinoma, clear cell type.     08/05/2013 Cancer Staging Stage IV, RCC with metastases to the bone.   08/08/2013 - 08/21/2013 Radiation Therapy Treated to 3 distinct areas corresponding to the lumbar spine from approximately L1-L5, the left hip/pelvis, in the proximal right femur. Each of these separate target areas were treated with 2 fields each. The target areas each received 30 gray in 10 fra   08/24/2013 - 10/17/2013 Chemotherapy Started Sutent 50 mg on Days 1-28, off days 29-42.     10/17/2013  Adverse Reaction Grade 4 elevated Transiminases with normal Bilirubin.  Sutent held until resolution.   11/17/2013 Imaging Re-staging CT C/A/P:Apparent interval response to therapy with decrease in mediastinal LAD and a lesion in the posterior right iliac bone.Other bony metastases and bilateral heterogeneously enhancing renallesions are not subtantial changed.   11/22/2013 -  Chemotherapy Sutent 50 mg on Days 1-28, off days 29-42 is restarted.   Liver enzymes normal. Referral to hepatology if increases again.  Consider afinitor as an alternative treatment.    02/28/2014 Imaging CAT scan CAP decrease in size of 2 renal lesions, 1 stable.decrease in osseous metastatic disease. Mediastinal LAD stable, Overall impresion stable disease to mild improvement   03/07/2014 - 06/11/2014 Chemotherapy Sutent was continued after that through now. Liver functions have been fine.   06/02/2014 Imaging CT scan CAP shows incraese in mediastinal adenopathy, left kidney lesion size incraese, new pulmonary nodules, progression seen in multi focal lytic bone metastasis (right rib lesion and L2 vertebra worse)   06/23/2014 -  Chemotherapy Afinitor started at 7.5 mg daily dose.    INTERVAL HISTORY: [name] 65 y.o. male  returns today  for follow-up. Since the last visit, he was hospitalized briefly in July 2016 after presented with profound anemia and weakness. He received packed red cell transfusions for hemoglobin of 6.2 and was discharged with a hemoglobin of 10.9. Since his discharge, he continues to be rather weak and spends the majority of his day in a recliner. His pain has been reasonably controlled however. Pain medication have been  successful in keeping him pain-free.   He denies any fevers or chills or acute shortness of breath.  He does use a walker for mobility and has not reported any syncope or falls. He denies abdominal pain, nausea/vomiting or rashes. He does not report any headaches or blurry vision or seizures.  He does not report any chest pain, palpitation orthopnea. He does not report any cough or wheezing or difficulty breathing. He does not report any nausea, vomiting, abdominal pain. He is not report any change in his bowel habits. He does not report any frequency urgency or hesitancy. He does not report any skeletal complaints. Rest of his review of systems unremarkable.  MEDICAL HISTORY: Past Medical History  Diagnosis Date  . Renal disorder   . Hypertension   . Dialysis patient   . Secondary hyperparathyroidism (of renal origin)     s/p parathyroidectomy  . Anemia due to chronic disease treated with erythropoietin   . Closed fracture of pubic ramus 08/2013    pathologic (metastatic renal cell ca)  . History of radiation therapy 08/08/13-08/21/13    L-1-L5, ,lt hip/pelvis,prox rt femur  . 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 Renal insufficiency     on dialysis x 10 yrs     MEDICATIONS:   Current Outpatient Prescriptions  Medication Sig Dispense Refill  . ALPRAZolam (XANAX) 0.25 MG tablet Take 1 tablet (0.25 mg total) by mouth 2 (two) times daily as needed for anxiety. 60 tablet 0  . B Complex-C-Folic Acid (DIALYVITE TABLET) TABS Take 1 tablet by mouth daily.  1  . calcium carbonate (TUMS - DOSED IN MG ELEMENTAL CALCIUM) 500 MG chewable tablet Chew 3 tablets by mouth daily as needed for heartburn.     . cyclobenzaprine (FLEXERIL) 10 MG tablet TAKE 1 TABLET BY MOUTH THREE TIMES DAILY IF NEEDED FOR MUSCLE SPASM 60 tablet 0  . diphenoxylate-atropine (LOMOTIL) 2.5-0.025 MG per tablet Take 1 tablet by mouth 3 (three) times daily as needed for diarrhea or loose stools.    . doxepin (SINEQUAN) 25 MG capsule Take 25 mg by mouth at bedtime.    . feeding supplement, ENSURE ENLIVE, (ENSURE ENLIVE) LIQD Take 237 mLs by mouth daily at 3 pm. 237 mL 12  . ferrous sulfate 325 (65 FE) MG EC tablet Take 1 tablet (325 mg total) by mouth 2 (two) times daily with breakfast and lunch. 60 tablet  3  . HYDROcodone-acetaminophen (NORCO/VICODIN) 5-325 MG per tablet Take 1-2 tablets by mouth 2 (two) times daily as needed. 90 tablet 0  . losartan (COZAAR) 100 MG tablet Take 100 mg by mouth daily.    . Nutritional Supplements (FEEDING SUPPLEMENT, NEPRO CARB STEADY,) LIQD Take 237 mLs by mouth 2 (two) times daily between meals. 60 Can 5  . oxyCODONE-acetaminophen (ROXICET) 5-325 MG per tablet Take 1-2 tablets by mouth every 4 (four) hours as needed for severe pain. 30 tablet 0  . prochlorperazine (COMPAZINE) 10 MG tablet Take 1 tablet (10 mg total) by mouth every 8 (eight) hours as needed for nausea or vomiting. 30 tablet 0  . Skin Protectants, Misc. (EUCERIN) cream Apply 1 application topically as needed for dry skin.    Marland Kitchen traMADol (ULTRAM) 50 MG tablet Take 1 tablet by mouth no more than twice a day as needed for pain 60 tablet 0  . axitinib (INLYTA) 5 MG tablet Take 1 tablet (5 mg total) by mouth daily. 30 tablet 0   No current facility-administered medications for this  visit.      PHYSICAL EXAMINATION: ECOG PERFORMANCE STATUS: 2.   Blood pressure 114/74, pulse 110, temperature 99.8 F (37.7 C), temperature source Oral, resp. rate 18, height 5\' 7"  (1.702 m).  GENERAL:chronically ill appearing thin male. Appeared slightly lethargic. SKIN: skin color, texture, turgor are normal, no rashes or significant lesions EYES: normal, Conjunctiva are pink and non-injected, sclera clear OROPHARYNX:no exudate, no oral thrush. NECK: supple, thyroid normal size, non-tender, without nodularity LYMPH:  no palpable lymphadenopathy in the cervical, axillary or supraclavicular LUNGS: clear to auscultation with normal breathing effort, no wheezes or rhonchi HEART: regular rate & rhythm and no murmurs and no lower extremity edema ABDOMEN:abdomen soft, non-tender and normal bowel sounds Musculoskeletal:no cyanosis of digits and no clubbing; R AV fistula.  NEURO: alert & oriented x 3 with fluent  speech.    CBC    Component Value Date/Time   WBC 9.2 04/16/2015 0310   WBC 10.1 04/15/2015 1049   RBC 3.18* 04/16/2015 0310   RBC 3.55* 04/15/2015 1649   RBC 3.90* 04/15/2015 1049   HGB 10.9* 04/16/2015 1515   HGB 7.7* 04/15/2015 1049   HCT 32.5* 04/16/2015 1515   HCT 25.1* 04/15/2015 1049   PLT 238 04/16/2015 0310   PLT 255 04/15/2015 1049   MCV 64.5* 04/16/2015 0310   MCV 64.4* 04/15/2015 1049   MCH 19.5* 04/16/2015 0310   MCH 19.7* 04/15/2015 1049   MCHC 30.2 04/16/2015 0310   MCHC 30.7* 04/15/2015 1049   RDW 20.4* 04/16/2015 0310   RDW 20.2* 04/15/2015 1049   LYMPHSABS 0.9 04/15/2015 1049   LYMPHSABS 1.7 03/26/2015 1156   MONOABS 1.2* 04/15/2015 1049   MONOABS 1.2* 03/26/2015 1156   EOSABS 0.1 04/15/2015 1049   EOSABS 0.1 03/26/2015 1156   BASOSABS 0.0 04/15/2015 1049   BASOSABS 0.0 03/26/2015 1156      Chemistry      Component Value Date/Time   NA 136 04/16/2015 1215   NA 139 04/15/2015 1049   K 3.9 04/16/2015 1215   K 4.1 04/15/2015 1049   CL 98* 04/16/2015 1215   CO2 25 04/16/2015 1215   CO2 30* 04/15/2015 1049   BUN 34* 04/16/2015 1215   BUN 23.9 04/15/2015 1049   CREATININE 5.74* 04/16/2015 1215   CREATININE 4.1* 04/15/2015 1049      Component Value Date/Time   CALCIUM 8.4* 04/16/2015 1215   CALCIUM 10.0 04/15/2015 1049   ALKPHOS 140 04/15/2015 1049   ALKPHOS 119 03/26/2015 1156   AST 51* 04/15/2015 1049   AST 33 03/26/2015 1156   ALT 24 04/15/2015 1049   ALT 25 03/26/2015 1156   BILITOT 0.46 04/15/2015 1049   BILITOT 0.8 03/26/2015 1156     EXAM: CT CHEST, ABDOMEN AND PELVIS WITHOUT CONTRAST  TECHNIQUE: Multidetector CT imaging of the chest, abdomen and pelvis was performed following the standard protocol without IV contrast.  COMPARISON: 10/06/2014  FINDINGS: CT CHEST FINDINGS  Mediastinum/Nodes: None 8 mm right axillary node, unchanged. Tortuous thoracic aorta, with atherosclerotic calcification within. Mild  cardiomegaly, without pericardial effusion. Pulmonary artery enlargement, including at 3.4 cm outflow tract.  High right paratracheal node measures 1.3 cm today versus 9 mm on the prior.  Low right paratracheal 1.8 cm node on image 21 is unchanged.  Subcarinal node measures 1.6 cm today versus 8 mm on the prior. Hilar regions poorly evaluated without intravenous contrast.  Subtle fluid level in the thoracic esophagus. No internal mammary adenopathy.  Lungs/Pleura: Small right and trace left pleural  effusions are new. Mild motion degradation throughout. 3 mm right upper lobe pulmonary nodule on image 25 is not readily apparent on the prior exam.  The more posterior right upper lobe 5 mm nodule the prior exam may be obscured by atelectasis and pleural thickening today. Image 26 approximately.  9 mm right lower lobe pulmonary nodule on image 36 is new. Mosaic attenuation in both lungs, suspicious for mild venous congestion.  Musculoskeletal: Irregularity of posterior left ribs, likely posttraumatic. Similar expansion of sixth posterior lateral right rib which could be posttraumatic or metastatic.  Heterogeneous destruction involving the posterior medial right ninth rib. Surrounding soft tissue mass with involvement of the adjacent right transverse process. Primary felt to be similar. There may be mild increase in soft tissue extension posteriorly into the paraspinous musculature.  CT ABDOMEN AND PELVIS FINDINGS  Hepatobiliary: Normal liver. Cholecystectomy, without biliary ductal dilatation.  Pancreas: Pancreatic atrophy, without mass or duct dilatation.  Spleen: Normal  Adrenals/Urinary Tract: Normal adrenal glands. Bilateral renal atrophy with multiple masses of varying complexity. Soft tissue density mass within the interpolar left kidney anteriorly measures 4.3 x 3.7 cm today versus 4.1 x 3.7 cm on the prior. No hydronephrosis. Decompressed urinary  bladder.  Stomach/Bowel: Normal stomach, without wall thickening. Colonic stool burden suggests constipation. Normal terminal ileum and appendix. Normal small bowel.  Vascular/Lymphatic: Aortic and branch vessel atherosclerosis. No retroperitoneal or retrocrural adenopathy. No pelvic sidewall adenopathy.  Reproductive: Mild prostatomegaly.  Other: No significant free fluid. Fat containing right femoral hernia is small. No ascites. A nodule within the right perinephric space measures 5 mm on image 56 and is new.  Musculoskeletal: Extensive osseous metastasis. Right femoral neck lytic lesion measures 1.4 cm on image 118 and is similar. Index lesion within the posterior right iliac is lytic with a soft tissue component. 3.0 x 1.9 cm on image 90. Compare 2.9 x 1.8 cm on the prior. Lytic lesion within the L2 vertebral body with secondary compression deformity. Grossly similar. Posterior L5 vertebral body lytic lesion is similar in size at 1.7 cm. No vertebral body height loss at this level. Lytic lesions surround the left worse than right hips and are similar.  IMPRESSION: 1. New or progressive pulmonary metastasis. 2. Progressive mediastinal adenopathy, suspicious for metastatic disease. Developing CHF could partially also cause this enlargement. 3. New bilateral pleural effusions with mosaic attenuation in the lungs, suspicious for mild interstitial edema. 4. Similar to minimal progression of widespread osseous metastasis. 5. Minimal enlargement of left renal primary. A new right perirenal nodule for which metastatic disease cannot be excluded. 6. Pulmonary artery enlargement suggests pulmonary arterial hypertension. 7. Atherosclerosis, including within the coronary arteries.     ASSESSMENT AND PLAN:    64 year old gentleman with the following issues: 1. 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 He is currently on Afinitor and  have tolerated it up to this point.  His most recent CT scan on 04/15/2015 was reviewed with the patient and his family. He appears to have progressive pulmonary metastasis, mediastinal adenopathy and slight progression of his bony metastasis.  Options of treatment were discussed today with the patient and his family. The first of this to stop the current treatment given the lack of efficacy. Options of treatment would include supportive care only, different salvage oral regimens such as Axitinib, Cabozantinib and immunotherapy.  Risks and benefits of all these approaches were discussed. I feel that he is a marginal candidate for salvage therapy but  he is willing and wanting to try a different regimen. I feel that Axitinib at 5 mg daily might offer some palliation of his cancer with limited complications. Side effects associated with this medication include GI toxicity, hypertension, hand-foot syndrome and fatigue. He is willing to try to at this time.  I reiterated the fact that his prognosis is rather poor from his cancer and probably has limited life expectancy. It is unclear that him and his family understand that this time and I will continue to address that with him.  2. Hepatotoxicity secondary to Sutent, resolved.    3. ESRDz on H/D. Continue hemodialysis per nephrology with epo (T/T/S).  4. Hypertension. Not exacerbated by his current anticancer medication. This will need to be monitored further.  5. Anemia in chronic kidney disease. Continue epo and RBC transfusion prn symptoms.   6. Pathological fracture of Left hip secondary to #1. Completed XRT on 08/21/2013. On hydrocodone at this time.  7. Anorexia and decreased appetiteHe is supplementing his diet with ensure/boost.   8. Follow-up. In one month for a clinical checkup.  [facility] MD 04/30/2015

## 2015-04-30 NOTE — Progress Notes (Signed)
Prescription for Inlyta taken to [facility].

## 2015-04-30 NOTE — Telephone Encounter (Signed)
per pof to sch pt appt-gave pt copy of avs °

## 2015-04-30 NOTE — Progress Notes (Signed)
I faxed biologics req for inlyta

## 2015-05-04 ENCOUNTER — Encounter: Payer: Self-pay | Admitting: Oncology

## 2015-05-04 NOTE — Progress Notes (Signed)
Per [facility] script approved inlyta tablet 02/01/15-05/01/18. I will send medical records.

## 2015-05-08 ENCOUNTER — Encounter: Payer: Self-pay | Admitting: Oncology

## 2015-05-08 NOTE — Progress Notes (Signed)
Per [facility] the [place] was delivered 05/06/15 with 0 copay

## 2015-05-15 ENCOUNTER — Other Ambulatory Visit: Payer: Self-pay | Admitting: *Deleted

## 2015-05-15 ENCOUNTER — Telehealth: Payer: Self-pay

## 2015-05-15 DIAGNOSIS — N186 End stage renal disease: Secondary | ICD-10-CM

## 2015-05-15 DIAGNOSIS — Z992 Dependence on renal dialysis: Secondary | ICD-10-CM

## 2015-05-15 DIAGNOSIS — C649 Malignant neoplasm of unspecified kidney, except renal pelvis: Secondary | ICD-10-CM

## 2015-05-15 DIAGNOSIS — C7951 Secondary malignant neoplasm of bone: Principal | ICD-10-CM

## 2015-05-15 MED ORDER — HYDROCODONE-ACETAMINOPHEN 5-325 MG PO TABS: 1.0000 | ORAL_TABLET | Freq: Two times a day (BID) | ORAL | Status: AC | PRN

## 2015-05-15 NOTE — Telephone Encounter (Signed)
Sister called requesting refill on pt's hydrocodone.

## 2015-05-15 NOTE — Telephone Encounter (Signed)
norco script refilled, patient's daughter shrurrane notified.

## 2015-05-26 ENCOUNTER — Inpatient Hospital Stay (HOSPITAL_COMMUNITY)
Admission: EM | Admit: 2015-05-26 | Discharge: 2015-06-04 | DRG: 308 | Disposition: E | Payer: Medicare Other | Attending: Internal Medicine | Admitting: Internal Medicine

## 2015-05-26 ENCOUNTER — Inpatient Hospital Stay (HOSPITAL_COMMUNITY): Payer: Medicare Other

## 2015-05-26 ENCOUNTER — Encounter (HOSPITAL_COMMUNITY): Payer: Self-pay | Admitting: *Deleted

## 2015-05-26 ENCOUNTER — Emergency Department (HOSPITAL_COMMUNITY): Payer: Medicare Other

## 2015-05-26 DIAGNOSIS — R64 Cachexia: Secondary | ICD-10-CM | POA: Diagnosis present

## 2015-05-26 DIAGNOSIS — T451X5A Adverse effect of antineoplastic and immunosuppressive drugs, initial encounter: Secondary | ICD-10-CM | POA: Diagnosis present

## 2015-05-26 DIAGNOSIS — Z681 Body mass index (BMI) 19 or less, adult: Secondary | ICD-10-CM | POA: Diagnosis not present

## 2015-05-26 DIAGNOSIS — B1920 Unspecified viral hepatitis C without hepatic coma: Secondary | ICD-10-CM | POA: Diagnosis present

## 2015-05-26 DIAGNOSIS — R7989 Other specified abnormal findings of blood chemistry: Secondary | ICD-10-CM | POA: Diagnosis not present

## 2015-05-26 DIAGNOSIS — Z992 Dependence on renal dialysis: Secondary | ICD-10-CM

## 2015-05-26 DIAGNOSIS — K219 Gastro-esophageal reflux disease without esophagitis: Secondary | ICD-10-CM | POA: Diagnosis present

## 2015-05-26 DIAGNOSIS — C7800 Secondary malignant neoplasm of unspecified lung: Secondary | ICD-10-CM | POA: Diagnosis present

## 2015-05-26 DIAGNOSIS — Z515 Encounter for palliative care: Secondary | ICD-10-CM

## 2015-05-26 DIAGNOSIS — E869 Volume depletion, unspecified: Secondary | ICD-10-CM | POA: Diagnosis present

## 2015-05-26 DIAGNOSIS — F17210 Nicotine dependence, cigarettes, uncomplicated: Secondary | ICD-10-CM | POA: Diagnosis present

## 2015-05-26 DIAGNOSIS — N2581 Secondary hyperparathyroidism of renal origin: Secondary | ICD-10-CM | POA: Diagnosis present

## 2015-05-26 DIAGNOSIS — I5189 Other ill-defined heart diseases: Secondary | ICD-10-CM | POA: Diagnosis present

## 2015-05-26 DIAGNOSIS — N186 End stage renal disease: Secondary | ICD-10-CM | POA: Diagnosis present

## 2015-05-26 DIAGNOSIS — I10 Essential (primary) hypertension: Secondary | ICD-10-CM | POA: Diagnosis not present

## 2015-05-26 DIAGNOSIS — D649 Anemia, unspecified: Secondary | ICD-10-CM | POA: Diagnosis present

## 2015-05-26 DIAGNOSIS — I472 Ventricular tachycardia: Principal | ICD-10-CM | POA: Diagnosis present

## 2015-05-26 DIAGNOSIS — E876 Hypokalemia: Secondary | ICD-10-CM | POA: Diagnosis present

## 2015-05-26 DIAGNOSIS — D631 Anemia in chronic kidney disease: Secondary | ICD-10-CM | POA: Diagnosis present

## 2015-05-26 DIAGNOSIS — K5641 Fecal impaction: Secondary | ICD-10-CM | POA: Diagnosis present

## 2015-05-26 DIAGNOSIS — R1084 Generalized abdominal pain: Secondary | ICD-10-CM | POA: Diagnosis present

## 2015-05-26 DIAGNOSIS — Z66 Do not resuscitate: Secondary | ICD-10-CM | POA: Diagnosis not present

## 2015-05-26 DIAGNOSIS — R946 Abnormal results of thyroid function studies: Secondary | ICD-10-CM | POA: Diagnosis present

## 2015-05-26 DIAGNOSIS — R590 Localized enlarged lymph nodes: Secondary | ICD-10-CM | POA: Diagnosis present

## 2015-05-26 DIAGNOSIS — G893 Neoplasm related pain (acute) (chronic): Secondary | ICD-10-CM | POA: Diagnosis present

## 2015-05-26 DIAGNOSIS — I4729 Other ventricular tachycardia: Secondary | ICD-10-CM

## 2015-05-26 DIAGNOSIS — E43 Unspecified severe protein-calorie malnutrition: Secondary | ICD-10-CM | POA: Diagnosis present

## 2015-05-26 DIAGNOSIS — D696 Thrombocytopenia, unspecified: Secondary | ICD-10-CM | POA: Diagnosis present

## 2015-05-26 DIAGNOSIS — I5021 Acute systolic (congestive) heart failure: Secondary | ICD-10-CM | POA: Diagnosis not present

## 2015-05-26 DIAGNOSIS — D6959 Other secondary thrombocytopenia: Secondary | ICD-10-CM | POA: Diagnosis present

## 2015-05-26 DIAGNOSIS — Z5111 Encounter for antineoplastic chemotherapy: Secondary | ICD-10-CM

## 2015-05-26 DIAGNOSIS — G9340 Encephalopathy, unspecified: Secondary | ICD-10-CM | POA: Diagnosis not present

## 2015-05-26 DIAGNOSIS — I272 Other secondary pulmonary hypertension: Secondary | ICD-10-CM | POA: Diagnosis present

## 2015-05-26 DIAGNOSIS — Z923 Personal history of irradiation: Secondary | ICD-10-CM | POA: Diagnosis not present

## 2015-05-26 DIAGNOSIS — C649 Malignant neoplasm of unspecified kidney, except renal pelvis: Secondary | ICD-10-CM | POA: Diagnosis present

## 2015-05-26 DIAGNOSIS — N189 Chronic kidney disease, unspecified: Secondary | ICD-10-CM | POA: Diagnosis not present

## 2015-05-26 DIAGNOSIS — R627 Adult failure to thrive: Secondary | ICD-10-CM | POA: Diagnosis present

## 2015-05-26 DIAGNOSIS — K5900 Constipation, unspecified: Secondary | ICD-10-CM | POA: Diagnosis present

## 2015-05-26 DIAGNOSIS — I120 Hypertensive chronic kidney disease with stage 5 chronic kidney disease or end stage renal disease: Secondary | ICD-10-CM | POA: Diagnosis present

## 2015-05-26 DIAGNOSIS — Z79899 Other long term (current) drug therapy: Secondary | ICD-10-CM

## 2015-05-26 DIAGNOSIS — D6481 Anemia due to antineoplastic chemotherapy: Secondary | ICD-10-CM | POA: Diagnosis present

## 2015-05-26 DIAGNOSIS — R9431 Abnormal electrocardiogram [ECG] [EKG]: Secondary | ICD-10-CM | POA: Diagnosis present

## 2015-05-26 DIAGNOSIS — C799 Secondary malignant neoplasm of unspecified site: Secondary | ICD-10-CM

## 2015-05-26 DIAGNOSIS — C7951 Secondary malignant neoplasm of bone: Secondary | ICD-10-CM | POA: Diagnosis present

## 2015-05-26 DIAGNOSIS — R630 Anorexia: Secondary | ICD-10-CM | POA: Diagnosis present

## 2015-05-26 DIAGNOSIS — I4581 Long QT syndrome: Secondary | ICD-10-CM | POA: Diagnosis not present

## 2015-05-26 LAB — CBC WITH DIFFERENTIAL/PLATELET
BASOS ABS: 0.4 10*3/uL — AB (ref 0.0–0.1)
BASOS ABS: 0.7 10*3/uL — AB (ref 0.0–0.1)
BASOS PCT: 6 % — AB (ref 0–1)
Basophils Relative: 4 % — ABNORMAL HIGH (ref 0–1)
EOS ABS: 0 10*3/uL (ref 0.0–0.7)
EOS PCT: 0 % (ref 0–5)
Eosinophils Absolute: 0 10*3/uL (ref 0.0–0.7)
Eosinophils Relative: 0 % (ref 0–5)
HCT: 26.6 % — ABNORMAL LOW (ref 39.0–52.0)
HEMATOCRIT: 28.9 % — AB (ref 39.0–52.0)
HEMOGLOBIN: 7.9 g/dL — AB (ref 13.0–17.0)
HEMOGLOBIN: 8.6 g/dL — AB (ref 13.0–17.0)
LYMPHS ABS: 1.4 10*3/uL (ref 0.7–4.0)
Lymphocytes Relative: 11 % — ABNORMAL LOW (ref 12–46)
Lymphocytes Relative: 12 % (ref 12–46)
Lymphs Abs: 1.2 10*3/uL (ref 0.7–4.0)
MCH: 21.8 pg — AB (ref 26.0–34.0)
MCH: 22.1 pg — AB (ref 26.0–34.0)
MCHC: 29.7 g/dL — AB (ref 30.0–36.0)
MCHC: 29.8 g/dL — AB (ref 30.0–36.0)
MCV: 73.5 fL — ABNORMAL LOW (ref 78.0–100.0)
MCV: 74.1 fL — AB (ref 78.0–100.0)
MONO ABS: 0.9 10*3/uL (ref 0.1–1.0)
MONOS PCT: 15 % — AB (ref 3–12)
Monocytes Absolute: 1.7 10*3/uL — ABNORMAL HIGH (ref 0.1–1.0)
Monocytes Relative: 8 % (ref 3–12)
NEUTROS ABS: 7.6 10*3/uL (ref 1.7–7.7)
NEUTROS PCT: 77 % (ref 43–77)
Neutro Abs: 8.6 10*3/uL — ABNORMAL HIGH (ref 1.7–7.7)
Neutrophils Relative %: 67 % (ref 43–77)
PLATELETS: 33 10*3/uL — AB (ref 150–400)
Platelets: 38 10*3/uL — ABNORMAL LOW (ref 150–400)
RBC: 3.62 MIL/uL — AB (ref 4.22–5.81)
RBC: 3.9 MIL/uL — ABNORMAL LOW (ref 4.22–5.81)
RDW: 32.8 % — AB (ref 11.5–15.5)
RDW: 33.2 % — ABNORMAL HIGH (ref 11.5–15.5)
Smear Review: DECREASED
WBC: 11.1 10*3/uL — AB (ref 4.0–10.5)
WBC: 11.4 10*3/uL — ABNORMAL HIGH (ref 4.0–10.5)

## 2015-05-26 LAB — COMPREHENSIVE METABOLIC PANEL
ALK PHOS: 175 U/L — AB (ref 38–126)
ALT: 28 U/L (ref 17–63)
ANION GAP: 18 — AB (ref 5–15)
AST: 55 U/L — ABNORMAL HIGH (ref 15–41)
Albumin: 1.9 g/dL — ABNORMAL LOW (ref 3.5–5.0)
BUN: 60 mg/dL — ABNORMAL HIGH (ref 6–20)
CALCIUM: 8.8 mg/dL — AB (ref 8.9–10.3)
CO2: 23 mmol/L (ref 22–32)
Chloride: 97 mmol/L — ABNORMAL LOW (ref 101–111)
Creatinine, Ser: 4.78 mg/dL — ABNORMAL HIGH (ref 0.61–1.24)
GFR, EST AFRICAN AMERICAN: 14 mL/min — AB (ref >60)
GFR, EST NON AFRICAN AMERICAN: 12 mL/min — AB (ref >60)
Glucose, Bld: 97 mg/dL (ref 65–99)
Potassium: 3.3 mmol/L — ABNORMAL LOW (ref 3.5–5.1)
SODIUM: 138 mmol/L (ref 135–145)
Total Bilirubin: 0.9 mg/dL (ref 0.3–1.2)
Total Protein: 6 g/dL — ABNORMAL LOW (ref 6.5–8.1)

## 2015-05-26 LAB — RETICULOCYTES
RBC.: 4.02 MIL/uL — ABNORMAL LOW (ref 4.22–5.81)
RETIC COUNT ABSOLUTE: 180.9 10*3/uL (ref 19.0–186.0)
RETIC CT PCT: 4.5 % — AB (ref 0.4–3.1)

## 2015-05-26 LAB — LACTATE DEHYDROGENASE: LDH: 759 U/L — AB (ref 98–192)

## 2015-05-26 LAB — DIRECT ANTIGLOBULIN TEST (NOT AT ARMC)
DAT, COMPLEMENT: NEGATIVE
DAT, IgG: NEGATIVE

## 2015-05-26 LAB — MRSA PCR SCREENING: MRSA BY PCR: NEGATIVE

## 2015-05-26 LAB — LIPASE, BLOOD: LIPASE: 14 U/L — AB (ref 22–51)

## 2015-05-26 LAB — PREPARE RBC (CROSSMATCH)

## 2015-05-26 LAB — MAGNESIUM: MAGNESIUM: 2.2 mg/dL (ref 1.7–2.4)

## 2015-05-26 LAB — TSH: TSH: 10.896 u[IU]/mL — ABNORMAL HIGH (ref 0.350–4.500)

## 2015-05-26 MED ORDER — ENSURE ENLIVE PO LIQD: 237.0000 mL | Freq: Every day | ORAL | Status: DC

## 2015-05-26 MED ORDER — SODIUM CHLORIDE 0.9 % IJ SOLN
3.0000 mL | Freq: Two times a day (BID) | INTRAMUSCULAR | Status: DC
  Administered 2015-05-26 – 2015-05-27 (×2): 3 mL via INTRAVENOUS
  Filled 2015-05-26: qty 3

## 2015-05-26 MED ORDER — FENTANYL 12 MCG/HR TD PT72: 12.5000 ug | MEDICATED_PATCH | TRANSDERMAL | Status: DC

## 2015-05-26 MED ORDER — POTASSIUM CHLORIDE 10 MEQ/100ML IV SOLN
10.0000 meq | INTRAVENOUS | Status: AC
  Administered 2015-05-26 (×3): 10 meq via INTRAVENOUS
  Filled 2015-05-26 (×3): qty 100

## 2015-05-26 MED ORDER — HYDROMORPHONE HCL 1 MG/ML IJ SOLN: 0.5000 mg | INTRAMUSCULAR | Status: DC | PRN

## 2015-05-26 MED ORDER — CYCLOBENZAPRINE HCL 10 MG PO TABS: 10.0000 mg | ORAL_TABLET | Freq: Three times a day (TID) | ORAL | Status: DC | PRN

## 2015-05-26 MED ORDER — RENA-VITE PO TABS: 1.0000 | ORAL_TABLET | Freq: Every day | ORAL | Status: DC

## 2015-05-26 MED ORDER — THIAMINE HCL 100 MG/ML IJ SOLN
100.0000 mg | Freq: Every day | INTRAMUSCULAR | Status: DC
  Administered 2015-05-27: 100 mg via INTRAVENOUS
  Filled 2015-05-26: qty 2

## 2015-05-26 MED ORDER — FERROUS SULFATE 325 (65 FE) MG PO TBEC: 325.0000 mg | DELAYED_RELEASE_TABLET | Freq: Two times a day (BID) | ORAL | Status: DC

## 2015-05-26 MED ORDER — POTASSIUM CHLORIDE 10 MEQ/100ML IV SOLN
10.0000 meq | Freq: Once | INTRAVENOUS | Status: AC
  Administered 2015-05-26: 10 meq via INTRAVENOUS
  Filled 2015-05-26: qty 100

## 2015-05-26 MED ORDER — CALCIUM CARBONATE ANTACID 500 MG PO CHEW
3.0000 | CHEWABLE_TABLET | Freq: Every day | ORAL | Status: DC | PRN
  Filled 2015-05-26: qty 3

## 2015-05-26 MED ORDER — SORBITOL 70 % SOLN
30.0000 mL | Freq: Every day | Status: DC | PRN
  Filled 2015-05-26: qty 30

## 2015-05-26 MED ORDER — FOLIC ACID 5 MG/ML IJ SOLN
1.0000 mg | Freq: Every day | INTRAMUSCULAR | Status: DC
  Administered 2015-05-26: 1 mg via INTRAVENOUS
  Filled 2015-05-26 (×2): qty 0.2

## 2015-05-26 MED ORDER — POLYETHYLENE GLYCOL 3350 17 G PO PACK: 17.0000 g | PACK | Freq: Two times a day (BID) | ORAL | Status: DC

## 2015-05-26 MED ORDER — HYDROMORPHONE HCL 1 MG/ML IJ SOLN
1.0000 mg | Freq: Once | INTRAMUSCULAR | Status: AC
  Administered 2015-05-26: 1 mg via INTRAVENOUS
  Filled 2015-05-26: qty 1

## 2015-05-26 MED ORDER — POTASSIUM CHLORIDE CRYS ER 20 MEQ PO TBCR
40.0000 meq | EXTENDED_RELEASE_TABLET | Freq: Once | ORAL | Status: DC
  Filled 2015-05-26: qty 2

## 2015-05-26 MED ORDER — ACETAMINOPHEN 325 MG PO TABS: 650.0000 mg | ORAL_TABLET | Freq: Four times a day (QID) | ORAL | Status: DC | PRN

## 2015-05-26 MED ORDER — OXYCODONE HCL 5 MG PO TABS: 5.0000 mg | ORAL_TABLET | ORAL | Status: DC | PRN

## 2015-05-26 MED ORDER — CALCITRIOL 0.5 MCG PO CAPS: 1.0000 ug | ORAL_CAPSULE | ORAL | Status: DC

## 2015-05-26 MED ORDER — NICOTINE 21 MG/24HR TD PT24
21.0000 mg | MEDICATED_PATCH | Freq: Every day | TRANSDERMAL | Status: DC
Start: 2015-05-26 — End: 2015-05-27
  Administered 2015-05-26 – 2015-05-27 (×2): 21 mg via TRANSDERMAL
  Filled 2015-05-26 (×2): qty 1

## 2015-05-26 MED ORDER — PROMETHAZINE HCL 25 MG PO TABS
12.5000 mg | ORAL_TABLET | Freq: Four times a day (QID) | ORAL | Status: DC | PRN
  Filled 2015-05-26: qty 1

## 2015-05-26 MED ORDER — TRAMADOL HCL 50 MG PO TABS: 50.0000 mg | ORAL_TABLET | Freq: Four times a day (QID) | ORAL | Status: DC | PRN

## 2015-05-26 MED ORDER — THIAMINE HCL 100 MG/ML IJ SOLN
100.0000 mg | Freq: Every day | INTRAMUSCULAR | Status: DC
  Administered 2015-05-26: 100 mg via INTRAVENOUS
  Filled 2015-05-26 (×2): qty 2

## 2015-05-26 MED ORDER — TRAMADOL HCL 50 MG PO TABS: 50.0000 mg | ORAL_TABLET | Freq: Two times a day (BID) | ORAL | Status: DC | PRN

## 2015-05-26 MED ORDER — FERROUS SULFATE 325 (65 FE) MG PO TABS: 325.0000 mg | ORAL_TABLET | Freq: Two times a day (BID) | ORAL | Status: DC

## 2015-05-26 MED ORDER — HYDROMORPHONE HCL 1 MG/ML IJ SOLN
0.5000 mg | INTRAMUSCULAR | Status: DC | PRN
  Administered 2015-05-27: 0.5 mg via INTRAVENOUS
  Filled 2015-05-26: qty 1

## 2015-05-26 MED ORDER — NEPRO/CARBSTEADY PO LIQD: 237.0000 mL | Freq: Two times a day (BID) | ORAL | Status: DC

## 2015-05-26 MED ORDER — DOCUSATE SODIUM 100 MG PO CAPS
100.0000 mg | ORAL_CAPSULE | Freq: Two times a day (BID) | ORAL | Status: DC
  Filled 2015-05-26: qty 1

## 2015-05-26 MED ORDER — ACETAMINOPHEN 650 MG RE SUPP: 650.0000 mg | Freq: Four times a day (QID) | RECTAL | Status: DC | PRN

## 2015-05-26 NOTE — ED Notes (Signed)
Report attempted 

## 2015-05-26 NOTE — ED Notes (Addendum)
Patient transported to CT 

## 2015-05-26 NOTE — ED Notes (Signed)
2 warm blankets placed on patient

## 2015-05-26 NOTE — Progress Notes (Signed)
  Echocardiogram 2D Echocardiogram has been performed.  Diamond Nickel 05/23/2015, 3:57 PM

## 2015-05-26 NOTE — Progress Notes (Signed)
Progress Note  I spoke to [name] [phone], we discussed overall poor prognosis and goals of care. She is interested in further discussions regarding goals of care for [name] and wishes to speak with Palliative Care. We also discussed code status. She feels that [name] would not want to undergo cardiopulmonary resuscitation, however wishes to discuss this further with her other family members including her mother. Patient unable to participate in his own plan of care given development of encephalopathy. He sister notified me that there was not a designated healthcare power of attorney. She plans to discuss these end-of-life decisions with other family members and with palliative care. For now will keep him as a full code.

## 2015-05-26 NOTE — Consult Note (Signed)
Reason for Consult: NSVT  Requesting Physician: Coralyn Pear  HPI: Mr. Molinelli is a 64 year old frail, cachectic and chronically ill-appearing African-American male being seen in the ER for abdominal pain. He is minimally communicative and obtaining a history is focal at best. He appears to have a history of hypertension on losartan and chronic renal insufficiency on hemodialysis. His other problems as outlined include hepatitis C, tobacco abuse and cancer in the past. We are asked to see him today because of a asymmetric episode of nonsustained ventricular tachycardia lasting approximate 70 beats. He has no prior cardiac history. He denies chest pain or shortness of breath. His serum creatinine is approximate 5 and his potassium was 3.3 which is being repleted. His magnesium level is 2.2.   Problem List: Patient Active Problem List   Diagnosis Date Noted  . Obstipation 06/03/2015  . Fecal impaction 05/07/2015  . Abdominal pain, generalized 05/25/2015  . QT prolongation 06/03/2015  . NSVT (nonsustained ventricular tachycardia) 05/17/2015  . Thrombocytopenia 05/16/2015  . Protein-calorie malnutrition, severe 04/16/2015  . Hyperkalemia 03/07/2014  . GERD (gastroesophageal reflux disease) 12/01/2013  . Transaminitis 11/24/2013  . 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 Hepatitis C 08/01/2013  . Secondary hyperparathyroidism (of renal origin) 08/01/2013  . Pathological fracture- of left pubic rami 07/31/2013  . Left hip pain 07/31/2013  . ESRD on hemodialysis 07/31/2013  . Anemia in chronic kidney disease 07/31/2013  . Hypertension 07/31/2013  . Tobacco abuse 07/31/2013    PMHx:  Past Medical History  Diagnosis Date  . Renal disorder   . Hypertension   . Dialysis patient   . Secondary hyperparathyroidism (of renal origin)     s/p parathyroidectomy  . Anemia due to chronic disease treated with erythropoietin   . Closed fracture of pubic ramus 08/2013   pathologic (metastatic renal cell ca)  . History of radiation therapy 08/08/13-08/21/13    L-1-L5, ,lt hip/pelvis,prox rt femur  . 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 Renal insufficiency     on dialysis x 10 yrs   Past Surgical History  Procedure Laterality Date  . Av fistula placement    . Revison of arteriovenous fistula Right 03/26/2015    Procedure: Exploration and Repair RIGHT Arm Arteriovenous  FISTULA ;  Surgeon: Angelia Mould, MD;  Location: [facility] OR;  Service: Vascular;  Laterality: Right;    FAMHx: Family History  Problem Relation Age of Onset  . Lung cancer      SOCHx:  reports that he has been smoking Cigarettes.  He has a 15 pack-year smoking history. He uses smokeless tobacco. He reports that he does not drink alcohol. His drug history is not on file.  ALLERGIES: No Known Allergies  ROS: Pertinent items are noted in HPI.  HOME MEDICATIONS:  (Not in a hospital admission)  HOSPITAL MEDICATIONS: I have reviewed the patient's current medications.  VITALS: Blood pressure 128/87, pulse 88, temperature 97.4 F (36.3 C), temperature source Oral, resp. rate 15, height 6' (1.829 m), weight 121 lb (54.885 kg), SpO2 100 %.  INPUT/OUTPUT        PHYSICAL EXAM: General appearance: alert and no distress Neck: no adenopathy, no carotid bruit, no JVD, supple, symmetrical, trachea midline and thyroid not enlarged, symmetric, no tenderness/mass/nodules Lungs: clear to auscultation bilaterally Heart: regular rate and rhythm, S1, S2 normal, no murmur, click, rub or gallop Extremities: extremities normal, atraumatic, no cyanosis or edema  LABS:  BMP  Recent Labs  04/16/15  0310 04/16/15 1215 05/06/2015 0827  NA 136 136 138  K 3.8 3.9 3.3*  CL 96* 98* 97*  CO2 27 25 23   GLUCOSE 102* 105* 97  BUN 27* 34* 60*  CREATININE 5.17* 5.74* 4.78*  CALCIUM 8.4* 8.4* 8.8*  GFRNONAA 11* 10* 12*  GFRAA 13* 11* 14*    CBC  Recent Labs Lab  06/02/2015 0827  WBC 11.1*  RBC 3.62*  HGB 7.9*  HCT 26.6*  PLT 33*  MCV 73.5*    HEMOGLOBIN A1C No results found for: HGBA1C, MPG  Cardiac Panel (last 3 results) No results for input(s): CKTOTAL, CKMB, TROPONINI, RELINDX in the last 8760 hours.  BNP (last 3 results) No results for input(s): PROBNP in the last 8760 hours.  TSH No results for input(s): TSH in the last 8760 hours.  CHOLESTEROL No results for input(s): CHOL in the last 8760 hours.  Hepatic Function Panel  Recent Labs  03/26/15 1156 04/15/15 1049 05/07/2015 0827  PROT 8.5* 8.0 6.0*  ALBUMIN 2.4* 2.1* 1.9*  AST 33 51* 55*  ALT 25 24 28   ALKPHOS 119 140 175*  BILITOT 0.8 0.46 0.9   Tele- NSR with PVCs  IMAGING: Ct Abdomen Pelvis Wo Contrast  05/26/2015   CLINICAL DATA:  Central abdominal pain  EXAM: CT ABDOMEN AND PELVIS WITHOUT CONTRAST  TECHNIQUE: Multidetector CT imaging of the abdomen and pelvis was performed following the standard protocol without IV contrast.  COMPARISON:  04/15/2015  FINDINGS: Sagittal images of the spine shows again metastatic bone lesions. Lytic lesion and pathologic fracture of L2 vertebral body again noted. There is a sclerotic lesion in T8 vertebral body. Lytic lesion is noted in L5 vertebral body. Again noted lytic lesions bilateral hips. There is small right pleural effusion. There is infiltrate with air bronchogram in right lower lobe. Spiculated nodule in right lower lobe axial image 11 measures 1.2 cm. Metastatic disease cannot be excluded.  There is trace perihepatic ascites. The study is limited without IV contrast. Status postcholecystectomy. Atherosclerotic calcifications of abdominal aorta, SMA origin, bilateral renal artery origin and bilateral common iliac arteries.  There is no small bowel obstruction. Moderate stool and gas noted within colon. Trace ascites is noted in left paracolic gutter. There is no pericecal inflammation. The terminal ileum is unremarkable.  There is  significant thickening of the wall of under distended urinary bladder. Chronic inflammation cystitis or neoplastic process cannot be excluded. Significant stool noted within distended rectum. Measures about 7.7 cm in diameter highly suspicious for fecal impaction. There is destructive lesion in left anterior acetabulum with a soft tissue component. The soft tissue component measures at least 2.2 cm highly suspicious for worsening metastatic disease. Again noted lytic lesion in right posterior iliac bone measures 2.4 cm with progression from prior exam. Lytic lesion in anterior aspect of left iliac bone measures 1.8 cm.  Bilateral innumerable liver cysts are again noted. Bilateral renal cortical thinning. Stable mixed density lesion in right kidney measures 3.5 cm. Unenhanced spleen, pancreas and adrenal glands are unremarkable.  There is no small bowel obstruction.  No free abdominal air.  IMPRESSION: 1. There is small right pleural effusion. Right lower lobe consolidation with air bronchogram. Pneumonia cannot be excluded. There is a nodule in right lower lobe measures 1.2 cm. Metastatic disease cannot be excluded. 2. Trace perihepatic ascites. 3. Again noted multiple bone lesions consistent with extensive metastatic disease. Some worsening of metastatic disease in within pelvis. 4. Again noted bilateral innumerable renal cysts. Stable complex  lesion in left kidney. 5. No small bowel obstruction. 6. Extensive atherosclerotic vascular calcifications. 7. Significant stool noted within rectum measures 7.7 cm in diameter suspicious for fecal impaction.   Electronically Signed   By: Lahoma Crocker M.D.   On: 05/29/2015 09:01    IMPRESSION: 1. Nonsustained ventricular tachycardia : Mr. Dains had a 71 beat run of nonsustained ventricular tachycardia. His potassium was low at 3.3 which is being repleted. There is no history of CAD. 2. Hypertension: Currently on Cozaar as an outpatient     RECOMMENDATION: 1. Replete  electrolytes, check 2-D echo. May benefit from low-dose beta blocker. I would not pursue ischemia workup at this time.  Time Spent Directly with Patient: 30 minutes  Quay Burow 05/19/2015, 11:29 AM

## 2015-05-26 NOTE — ED Provider Notes (Signed)
CSN: [id number]     Arrival date & time 05/04/2015  0619 History   First MD Initiated Contact with Patient 05/31/2015 0700     Chief Complaint  Patient presents with  . Abdominal Pain     (Consider location/radiation/quality/duration/timing/severity/associated sxs/prior Treatment) HPI The patient states he has had central abdominal pain for months. He reports it is coming and going. He has a hard time describing the quality. The note indicates the patient went to dialysis and reported his pain and was sent to the emergency department. The patient did not describe having already been to dialysis to me, he however does seem to be a limited historian. He is denying vomiting or diarrhea. Patient reported to me that he makes very little urine. He does not report fever. Past Medical History  Diagnosis Date  . Renal disorder   . Hypertension   . Dialysis patient   . Secondary hyperparathyroidism (of renal origin)     s/p parathyroidectomy  . Anemia due to chronic disease treated with erythropoietin   . Closed fracture of pubic ramus 08/2013    pathologic (metastatic renal cell ca)  . History of radiation therapy 08/08/13-08/21/13    L-1-L5, ,lt hip/pelvis,prox rt femur  . 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 Renal insufficiency     on dialysis x 10 yrs   Past Surgical History  Procedure Laterality Date  . Av fistula placement    . Revison of arteriovenous fistula Right 03/26/2015    Procedure: Exploration and Repair RIGHT Arm Arteriovenous  FISTULA ;  Surgeon: Angelia Mould, MD;  Location: [facility] OR;  Service: Vascular;  Laterality: Right;   Family History  Problem Relation Age of Onset  . Lung cancer     Social History  Substance Use Topics  . Smoking status: Current Every Day Smoker -- 1.00 packs/day for 15 years    Types: Cigarettes  . Smokeless tobacco: Current User  . Alcohol Use: No    Review of Systems  10 Systems reviewed and are negative for acute change  except as noted in the HPI.   Allergies  Review of patient's allergies indicates no known allergies.  Home Medications   Prior to Admission medications   Medication Sig Start Date End Date Taking? Authorizing Provider  ALPRAZolam (XANAX) 0.25 MG tablet Take 1 tablet (0.25 mg total) by mouth 2 (two) times daily as needed for anxiety. 04/16/15  Yes [name], MD  axitinib (INLYTA) 5 MG tablet Take 1 tablet (5 mg total) by mouth daily. 04/30/15  Yes [name], MD  B Complex-C-Folic Acid (DIALYVITE TABLET) TABS Take 1 tablet by mouth daily. 04/29/15  Yes Historical Provider, MD  calcium carbonate (TUMS - DOSED IN MG ELEMENTAL CALCIUM) 500 MG chewable tablet Chew 3 tablets by mouth daily as needed for heartburn.    Yes Historical Provider, MD  cyclobenzaprine (FLEXERIL) 10 MG tablet TAKE 1 TABLET BY MOUTH THREE TIMES DAILY IF NEEDED FOR MUSCLE SPASM 04/16/15  Yes [name], MD  diphenoxylate-atropine (LOMOTIL) 2.5-0.025 MG per tablet Take 1 tablet by mouth 3 (three) times daily as needed for diarrhea or loose stools.   Yes Historical Provider, MD  doxepin (SINEQUAN) 25 MG capsule Take 25 mg by mouth at bedtime.   Yes Historical Provider, MD  feeding supplement, ENSURE ENLIVE, (ENSURE ENLIVE) LIQD Take 237 mLs by mouth daily at 3 pm. 04/16/15  Yes Annita Brod, MD  ferrous sulfate 325 (65 FE) MG EC tablet  Take 1 tablet (325 mg total) by mouth 2 (two) times daily with breakfast and lunch. 10/18/13  Yes Concha Norway, MD  HYDROcodone-acetaminophen (NORCO/VICODIN) 5-325 MG per tablet Take 1-2 tablets by mouth 2 (two) times daily as needed. 05/15/15  Yes [name], MD  losartan (COZAAR) 100 MG tablet Take 100 mg by mouth daily. 04/07/15  Yes Historical Provider, MD  Nutritional Supplements (FEEDING SUPPLEMENT, NEPRO CARB STEADY,) LIQD Take 237 mLs by mouth 2 (two) times daily between meals. 04/16/15  Yes Annita Brod, MD  oxyCODONE-acetaminophen (ROXICET) 5-325 MG per tablet Take 1-2  tablets by mouth every 4 (four) hours as needed for severe pain. 03/26/15  Yes Angelia Mould, MD  prochlorperazine (COMPAZINE) 10 MG tablet Take 1 tablet (10 mg total) by mouth every 8 (eight) hours as needed for nausea or vomiting. 01/26/15  Yes [name], MD  Skin Protectants, Misc. (EUCERIN) cream Apply 1 application topically as needed for dry skin.   Yes Historical Provider, MD  traMADol (ULTRAM) 50 MG tablet Take 1 tablet by mouth no more than twice a day as needed for pain 02/13/15  Yes Adrena E Johnson, PA-C   BP 119/80 mmHg  Pulse 79  Temp(Src) 97.4 F (36.3 C) (Oral)  Resp 17  Ht 6' (1.829 m)  Wt 121 lb (54.885 kg)  BMI 16.41 kg/m2  SpO2 96% Physical Exam  Constitutional:  Patient is a very debilitated elderly gentleman. He is awake and alert. He has no respiratory distress. He is very deconditioned.  HENT:  Head: Normocephalic and atraumatic.  Very poor dentition. Mucous membranes are pink and moist.  Eyes: EOM are normal. Pupils are equal, round, and reactive to light. Right eye exhibits no discharge. Left eye exhibits no discharge. No scleral icterus.  Neck: Neck supple.  Cardiovascular: Normal rate, regular rhythm and intact distal pulses.   2/6 systolic ejection murmur.  Pulmonary/Chest: Effort normal and breath sounds normal.  Faint wheeze cleared with cough. Good air flow to both lung fields.  Abdominal: Soft. There is tenderness.  Patient endorses tenderness diffusely. This seems to be more epigastric and central. There is no guarding. No distention.  Musculoskeletal:  1-2+ pitting edema bilateral lower extremity is. No calf tenderness. Skin thinning and dryness consistent with chronic venous stasis. The fistula patient's right upper arm has an eroded eschar without any active bleeding present at this time.  Neurological: He is alert. He exhibits normal muscle tone. Coordination normal.  Patient is awake and alert. He seems somewhat cognitively delayed but  no evidence of delirium or acute confusional state. Patient has symmetric use of all extremities. He is generally deconditioned and limited in his movements however this does not appear to be localizing, motor deficit.  Skin: Skin is warm and dry.    ED Course  Procedures (including critical care time) Labs Review Labs Reviewed  COMPREHENSIVE METABOLIC PANEL - Abnormal; Notable for the following:    Potassium 3.3 (*)    Chloride 97 (*)    BUN 60 (*)    Creatinine, Ser 4.78 (*)    Calcium 8.8 (*)    Total Protein 6.0 (*)    Albumin 1.9 (*)    AST 55 (*)    Alkaline Phosphatase 175 (*)    GFR calc non Af Amer 12 (*)    GFR calc Af Amer 14 (*)    Anion gap 18 (*)    All other components within normal limits  LIPASE, BLOOD - Abnormal;  Notable for the following:    Lipase 14 (*)    All other components within normal limits  CBC WITH DIFFERENTIAL/PLATELET - Abnormal; Notable for the following:    WBC 11.1 (*)    RBC 3.62 (*)    Hemoglobin 7.9 (*)    HCT 26.6 (*)    MCV 73.5 (*)    MCH 21.8 (*)    MCHC 29.7 (*)    RDW 32.8 (*)    Platelets 33 (*)    Lymphocytes Relative 11 (*)    Basophils Relative 4 (*)    Neutro Abs 8.6 (*)    Basophils Absolute 0.4 (*)    All other components within normal limits  MAGNESIUM  PREPARE RBC (CROSSMATCH)  TYPE AND SCREEN    Imaging Review Ct Abdomen Pelvis Wo Contrast  05/19/2015   CLINICAL DATA:  Central abdominal pain  EXAM: CT ABDOMEN AND PELVIS WITHOUT CONTRAST  TECHNIQUE: Multidetector CT imaging of the abdomen and pelvis was performed following the standard protocol without IV contrast.  COMPARISON:  04/15/2015  FINDINGS: Sagittal images of the spine shows again metastatic bone lesions. Lytic lesion and pathologic fracture of L2 vertebral body again noted. There is a sclerotic lesion in T8 vertebral body. Lytic lesion is noted in L5 vertebral body. Again noted lytic lesions bilateral hips. There is small right pleural effusion. There is  infiltrate with air bronchogram in right lower lobe. Spiculated nodule in right lower lobe axial image 11 measures 1.2 cm. Metastatic disease cannot be excluded.  There is trace perihepatic ascites. The study is limited without IV contrast. Status postcholecystectomy. Atherosclerotic calcifications of abdominal aorta, SMA origin, bilateral renal artery origin and bilateral common iliac arteries.  There is no small bowel obstruction. Moderate stool and gas noted within colon. Trace ascites is noted in left paracolic gutter. There is no pericecal inflammation. The terminal ileum is unremarkable.  There is significant thickening of the wall of under distended urinary bladder. Chronic inflammation cystitis or neoplastic process cannot be excluded. Significant stool noted within distended rectum. Measures about 7.7 cm in diameter highly suspicious for fecal impaction. There is destructive lesion in left anterior acetabulum with a soft tissue component. The soft tissue component measures at least 2.2 cm highly suspicious for worsening metastatic disease. Again noted lytic lesion in right posterior iliac bone measures 2.4 cm with progression from prior exam. Lytic lesion in anterior aspect of left iliac bone measures 1.8 cm.  Bilateral innumerable liver cysts are again noted. Bilateral renal cortical thinning. Stable mixed density lesion in right kidney measures 3.5 cm. Unenhanced spleen, pancreas and adrenal glands are unremarkable.  There is no small bowel obstruction.  No free abdominal air.  IMPRESSION: 1. There is small right pleural effusion. Right lower lobe consolidation with air bronchogram. Pneumonia cannot be excluded. There is a nodule in right lower lobe measures 1.2 cm. Metastatic disease cannot be excluded. 2. Trace perihepatic ascites. 3. Again noted multiple bone lesions consistent with extensive metastatic disease. Some worsening of metastatic disease in within pelvis. 4. Again noted bilateral innumerable  renal cysts. Stable complex lesion in left kidney. 5. No small bowel obstruction. 6. Extensive atherosclerotic vascular calcifications. 7. Significant stool noted within rectum measures 7.7 cm in diameter suspicious for fecal impaction.   Electronically Signed   By: Lahoma Crocker M.D.   On: 05/25/2015 09:01   I have personally reviewed and evaluated these images and lab results as part of my medical decision-making.   EKG Interpretation  Date/Time:  Tuesday May 26 2015 09:47:11 EDT Ventricular Rate:  82 PR Interval:  174 QRS Duration: 95 QT Interval:  453 QTC Calculation: 529 R Axis:   32 Text Interpretation:  Sinus rhythm Ventricular premature complex  Nonspecific T abnormalities, diffuse leads Prolonged QT interval Baseline  wander in lead(s) V6 Confirmed by [name], MD, Jeannie Done [phone]) on 05/13/2015  10:17:04 AM     Consult: Patient's case was reviewed with [name], due to the patient having developed nonsustained ventricular tachycardia, we will opt to transfuse the patient's chronic anemia at this point time. Consult: [facility] hospitalist for admission.  CRITICAL CARE Performed by: Charlesetta Shanks   Total critical care time: 30  Critical care time was exclusive of separately billable procedures and treating other patients.  Critical care was necessary to treat or prevent imminent or life-threatening deterioration.  Critical care was time spent personally by me on the following activities: development of treatment plan with patient and/or surrogate as well as nursing, discussions with consultants, evaluation of patient's response to treatment, examination of patient, obtaining history from patient or surrogate, ordering and performing treatments and interventions, ordering and review of laboratory studies, ordering and review of radiographic studies, pulse oximetry and re-evaluation of patient's condition. MDM   Final diagnoses:  Generalized abdominal pain  Anemia in chronic  renal disease  Metastatic cancer  Nonsustained ventricular tachycardia   Patient present with multiple severe comorbid illness. At this time the abdominal pain does not appear to have an acute etiology. He does have metastatic renal carcinoma. The anemia has been chronic and recurrent as well. He is not hyperkalemic but has missed dialysis for today. During his stay and observation in the emergency department he did have an episode of nonsustained ventricular tachycardia approximately 49 beats. The patient will be admitted to the hospital for ongoing cardiac monitoring and transfusion of blood.     Charlesetta Shanks, MD 05/24/2015 1047

## 2015-05-26 NOTE — ED Notes (Signed)
Charge nurse attempting IV

## 2015-05-26 NOTE — ED Notes (Signed)
Patient resting on stretcher, no needs at this time 

## 2015-05-26 NOTE — ED Notes (Signed)
Patient went to dialysis this morning and told them he was having lower abd pain (pointing to his umbilicus). When asked how ling this has been going on stated "40 mins".  Denies N/V/D  Stated he does not have a BM daily but unable to say when the last time he had a BM.  States he still makes urine

## 2015-05-26 NOTE — ED Notes (Signed)
IV attempt unsuccessful

## 2015-05-26 NOTE — Consult Note (Signed)
[facility] Renal Consultation Note  Indication for Consultation:  Management of ESRD/hemodialysis; anemia, hypertension/volume and secondary hyperparathyroidism  HPI: [name] is a 64 y.o. male on HD since 1998 ( [facility] )last HD on schedule Saturday/ Renal cell CA Metastatic  Onco treating with oral meds ,now presents in ER co of  abdominal pain and bilateral flank pain  worsening over past weeks.He is unable to give me much history of his complaint but today  denying vomiting, diarrhea, fevers, nausea  . He does recognize me from the kidney center .He was noted to have  fecal impaction and developed a 49 beat run of nonsustained ventricular tachycardia with spontaneous return back to sinus rhythm with frequent PVCs.  When I discussed with him need for Hemodialysis today his normal day =- he tells me he is ready to stop hemodialysis and does not want to be in pain anymore. He tells me he is aware that with out HD we will die and he is ready accept this.      Past Medical History  Diagnosis Date  . Renal disorder   . Hypertension   . Dialysis patient   . Secondary hyperparathyroidism (of renal origin)     s/p parathyroidectomy  . Anemia due to chronic disease treated with erythropoietin   . Closed fracture of pubic ramus 08/2013    pathologic (metastatic renal cell ca)  . History of radiation therapy 08/08/13-08/21/13    L-1-L5, ,lt hip/pelvis,prox rt femur  . 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 Renal insufficiency     on dialysis x 10 yrs    Past Surgical History  Procedure Laterality Date  . Av fistula placement    . Revison of arteriovenous fistula Right 03/26/2015    Procedure: Exploration and Repair RIGHT Arm Arteriovenous  FISTULA ;  Surgeon: Angelia Mould, MD;  Location: [facility] OR;  Service: Vascular;  Laterality: Right;      Family History  Problem Relation Age of Onset  . Lung cancer        reports that he has been  smoking Cigarettes.  He has a 15 pack-year smoking history. He uses smokeless tobacco. He reports that he does not drink alcohol. His drug history is not on file.   Allergies  Allergen Reactions  . Xanax [Alprazolam] Other (See Comments)    hallucinations    Prior to Admission medications   Medication Sig Start Date End Date Taking? Authorizing Provider  ALPRAZolam (XANAX) 0.25 MG tablet Take 1 tablet (0.25 mg total) by mouth 2 (two) times daily as needed for anxiety. 04/16/15  Yes [name], MD  B Complex-C-Folic Acid (DIALYVITE TABLET) TABS Take 1 tablet by mouth daily. 04/29/15  Yes Historical Provider, MD  calcium carbonate (TUMS - DOSED IN MG ELEMENTAL CALCIUM) 500 MG chewable tablet Chew 3 tablets by mouth daily as needed for heartburn.    Yes Historical Provider, MD  cyclobenzaprine (FLEXERIL) 10 MG tablet TAKE 1 TABLET BY MOUTH THREE TIMES DAILY IF NEEDED FOR MUSCLE SPASM 04/16/15  Yes [name], MD  diphenoxylate-atropine (LOMOTIL) 2.5-0.025 MG per tablet Take 1 tablet by mouth 3 (three) times daily as needed for diarrhea or loose stools.   Yes Historical Provider, MD  doxepin (SINEQUAN) 25 MG capsule Take 25 mg by mouth at bedtime as needed (for sleep).    Yes Historical Provider, MD  feeding supplement, ENSURE ENLIVE, (ENSURE ENLIVE) LIQD Take 237 mLs by mouth daily at 3  pm. 04/16/15  Yes Annita Brod, MD  ferrous sulfate 325 (65 FE) MG EC tablet Take 1 tablet (325 mg total) by mouth 2 (two) times daily with breakfast and lunch. 10/18/13  Yes Concha Norway, MD  HYDROcodone-acetaminophen (NORCO/VICODIN) 5-325 MG per tablet Take 1-2 tablets by mouth 2 (two) times daily as needed. Patient taking differently: Take 1 tablet by mouth 2 (two) times daily as needed for moderate pain.  05/15/15  Yes [name], MD  losartan (COZAAR) 100 MG tablet Take 100 mg by mouth at bedtime.  04/07/15  Yes Historical Provider, MD  Nutritional Supplements (FEEDING SUPPLEMENT, NEPRO CARB STEADY,) LIQD  Take 237 mLs by mouth 2 (two) times daily between meals. 04/16/15  Yes Annita Brod, MD  oxyCODONE-acetaminophen (ROXICET) 5-325 MG per tablet Take 1-2 tablets by mouth every 4 (four) hours as needed for severe pain. 03/26/15  Yes Angelia Mould, MD  prochlorperazine (COMPAZINE) 10 MG tablet Take 1 tablet (10 mg total) by mouth every 8 (eight) hours as needed for nausea or vomiting. 01/26/15  Yes [name], MD  Skin Protectants, Misc. (EUCERIN) cream Apply 1 application topically as needed for dry skin.   Yes Historical Provider, MD  traMADol (ULTRAM) 50 MG tablet Take 1 tablet by mouth no more than twice a day as needed for pain 02/13/15  Yes Adrena E Johnson, PA-C  AFINITOR 7.5 MG tablet Take 7.5 mg by mouth daily. 04/21/15   Historical Provider, MD  axitinib (INLYTA) 5 MG tablet Take 1 tablet (5 mg total) by mouth daily. Patient not taking: Reported on 05/04/2015 04/30/15   [name], MD  chlorproMAZINE (THORAZINE) 25 MG tablet Take 25 mg by mouth 3 (three) times daily as needed for nausea or vomiting.  04/30/15   Historical Provider, MD     Anti-infectives    None      Results for orders placed or performed during the hospital encounter of 05/06/2015 (from the past 48 hour(s))  Comprehensive metabolic panel     Status: Abnormal   Collection Time: 06/02/2015  8:27 AM  Result Value Ref Range   Sodium 138 135 - 145 mmol/L   Potassium 3.3 (L) 3.5 - 5.1 mmol/L   Chloride 97 (L) 101 - 111 mmol/L   CO2 23 22 - 32 mmol/L   Glucose, Bld 97 65 - 99 mg/dL   BUN 60 (H) 6 - 20 mg/dL   Creatinine, Ser 4.78 (H) 0.61 - 1.24 mg/dL   Calcium 8.8 (L) 8.9 - 10.3 mg/dL   Total Protein 6.0 (L) 6.5 - 8.1 g/dL   Albumin 1.9 (L) 3.5 - 5.0 g/dL   AST 55 (H) 15 - 41 U/L   ALT 28 17 - 63 U/L   Alkaline Phosphatase 175 (H) 38 - 126 U/L   Total Bilirubin 0.9 0.3 - 1.2 mg/dL   GFR calc non Af Amer 12 (L) >60 mL/min   GFR calc Af Amer 14 (L) >60 mL/min    Comment: (NOTE) The eGFR has been  calculated using the CKD EPI equation. This calculation has not been validated in all clinical situations. eGFR's persistently <60 mL/min signify possible Chronic Kidney Disease.    Anion gap 18 (H) 5 - 15  Lipase, blood     Status: Abnormal   Collection Time: 05/05/2015  8:27 AM  Result Value Ref Range   Lipase 14 (L) 22 - 51 U/L  CBC with Differential     Status: Abnormal  Collection Time: 05/18/2015  8:27 AM  Result Value Ref Range   WBC 11.1 (H) 4.0 - 10.5 K/uL    Comment: REPEATED TO VERIFY ADJUSTED FOR NUCLEATED RBC'S    RBC 3.62 (L) 4.22 - 5.81 MIL/uL   Hemoglobin 7.9 (L) 13.0 - 17.0 g/dL    Comment: SPECIMEN CHECKED FOR CLOTS REPEATED TO VERIFY    HCT 26.6 (L) 39.0 - 52.0 %   MCV 73.5 (L) 78.0 - 100.0 fL   MCH 21.8 (L) 26.0 - 34.0 pg   MCHC 29.7 (L) 30.0 - 36.0 g/dL   RDW 32.8 (H) 11.5 - 15.5 %   Platelets 33 (L) 150 - 400 K/uL    Comment: PLATELET COUNT CONFIRMED BY SMEAR REPEATED TO VERIFY    Neutrophils Relative % 77 43 - 77 %   Lymphocytes Relative 11 (L) 12 - 46 %   Monocytes Relative 8 3 - 12 %   Eosinophils Relative 0 0 - 5 %   Basophils Relative 4 (H) 0 - 1 %   Neutro Abs 8.6 (H) 1.7 - 7.7 K/uL   Lymphs Abs 1.2 0.7 - 4.0 K/uL   Monocytes Absolute 0.9 0.1 - 1.0 K/uL   Eosinophils Absolute 0.0 0.0 - 0.7 K/uL   Basophils Absolute 0.4 (H) 0.0 - 0.1 K/uL   RBC Morphology POLYCHROMASIA PRESENT     Comment: TARGET CELLS   WBC Morphology MILD LEFT SHIFT (1-5% METAS, OCC MYELO, OCC BANDS)     Comment: ATYPICAL LYMPHOCYTES   Smear Review PLATELETS APPEAR DECREASED   Type and screen for Red Blood Exchange     Status: None (Preliminary result)   Collection Time: 05/31/2015 10:23 AM  Result Value Ref Range   ABO/RH(D) B POS    Antibody Screen POS    Sample Expiration 05/29/2015    Antibody Identification ANTI K    DAT, IgG NEG    Unit Number [id number]    Blood Component Type RED CELLS,LR    Unit division 00    Status of Unit ALLOCATED    Donor AG Type  NEGATIVE FOR KELL ANTIGEN    Transfusion Status OK TO TRANSFUSE    Crossmatch Result COMPATIBLE    Unit Number [id number]    Blood Component Type RED CELLS,LR    Unit division 00    Status of Unit ALLOCATED    Donor AG Type NEGATIVE FOR KELL ANTIGEN    Transfusion Status OK TO TRANSFUSE    Crossmatch Result COMPATIBLE   Magnesium     Status: None   Collection Time: 05/06/2015 10:40 AM  Result Value Ref Range   Magnesium 2.2 1.7 - 2.4 mg/dL  Lactate dehydrogenase     Status: Abnormal   Collection Time: 05/22/2015 12:03 PM  Result Value Ref Range   LDH 759 (H) 98 - 192 U/L     ROS: poor historian see hpi  Physical Exam: Filed Vitals:   05/15/2015 1230  BP: 129/91  Pulse:   Temp:   Resp: 18     General: Lethargic BM oriented to person only, cachetic elderly BM  appearing NAD HEENT: [place] sclera MM dry  Neck: supple  Heart: Tachy 112  Vr on telem. With rear skipped beats, soft 1/6 sem lsb no rub Lungs: Decr bs a t bases otherwise clear  non labored breathing Abdomen: bs pos sl decr,  non distended , tender throughoput Extremities: 2 +  Bipedal edema / R arm swollen  Skin: dry, scattered skin excoriation s old  Neuro: lower extrem weakness R> L / somewhat lethargic but answering questions  Dialysis Access: pos bruit R UA AVF  Dialysis Orders: [facility]  [facility] . EDW  HD Bath 52  BUT leaving below  50.8<51  Bath 2.0 k , 3.5 ca Time 4hrs Heparin 2500 initial then 2500 mid. Access [email or web address]     Calcitriol 1.88mg po/HD Mircera 225        Other op labs hgb 8.2 Ca 9.3 phos 3.5  pth <7  Assessment/Plan 1. ESRD - Hd normal schedule TTS BUT pt tells me ready to STOP HD / consulting Pall care 2. QT Prolongation  /NSVT-  Per admit /card to see 3. Obstipation / Abdpain - per admit 4. 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 Hypertension/volume  - has pedal edema sec to wt loss with CA / ESRD  Was lowering edw as op  6. Anemia  [facility] / hgb 7.9 7. Metabolic bone disease -  No binders with phos stable as op? Po calciriol to help with low Ca as outpt and also 3.5 Ca bath   DErnest Haber PA-C [phone]/23/2016, 1:02 PM   Pt seen, examined and agree w A/P as above. 64yo male with metastatic RCC, chronic cancer-related pain and FTT.  Presents with abd pain, CT showed fecal impaction. Had long run of NSVT in ED.  Told [place] that he didn't want anymore dialysis, doesn't want to be in pain anymore and was aware that w/o dialysis he would die.  Then in a later conversation he said that he "wants to live" when speaking with the hospital MD.  Will hold HD today (pt, and family request) and reassess in am when he has had more time to think about these issues.  RKelly SplinterMD pager [phone]   cell [phone]/23/2016, 2:26 PM

## 2015-05-26 NOTE — Progress Notes (Addendum)
Nephro has cancelled PRBCs since will NOT undergo HD today- thus far is hemodynamically stable but if Hgb drops further/significantly or he becomes hemodynamically unstable (while still a full code) then will will need to consider transfusion. LDH 759 and Haptoglobin pending- will ck retic count and DAT- this may also help family in decision regarding code status.  410 pm; Retic ct mildly elevated but DAT neg- haptoglobin and pathologist smear review pending  519 pm: TSH 10.896- ck Free T4 and T3 in am ; ECHO with NEW systolic dysfunction w/ EF 35-40%, mod LVH and mild pulmonary HTN 47 mmHg-last ECHO 2007 with normal LVEF  Erin Hearing, ANP

## 2015-05-26 NOTE — ED Notes (Signed)
EKG performed per Richardson Landry, RN

## 2015-05-26 NOTE — ED Notes (Signed)
IV team consulted.  K+ paused for now.

## 2015-05-26 NOTE — ED Notes (Signed)
Patient returned from CT

## 2015-05-26 NOTE — ED Notes (Signed)
Patient had 49 beat of vtach that spontaneously terminated into NSR.  Zoll defibrillator placed on patient, 12 lead EKG performed.  Dr. Colvin Caroli informed.

## 2015-05-26 NOTE — ED Notes (Signed)
Patient tore off monitoring equipment and removed his IV.  Physician at bedside.  Dr. Coralyn Pear states to hold off on blood products for now until he speaks with nephrology and develops a plan of care as far as palliative care or continuing with dialysis, etc.

## 2015-05-26 NOTE — ED Notes (Signed)
Unable to obtain labs with IV start.  Phlebotomy to draw

## 2015-05-26 NOTE — ED Notes (Signed)
Patient resting on stretcher no needs at this time

## 2015-05-26 NOTE — ED Notes (Signed)
Richardson Landry, RN is with patient at this time

## 2015-05-26 NOTE — Progress Notes (Signed)
MEDICATION RELATED CONSULT NOTE - INITIAL   Pharmacy Consult for review of med list Indication: QTc prolonging agents  Allergies  Allergen Reactions  . Xanax [Alprazolam] Other (See Comments)    hallucinations    Patient Measurements: Height: 6' (182.9 cm) Weight: 121 lb (54.885 kg) IBW/kg (Calculated) : 77.6 Adjusted Body Weight:   Vital Signs: Temp: 97.4 F (36.3 C) (08/23 0632) Temp Source: Oral (08/23 6599) BP: 127/83 mmHg (08/23 1300) Pulse Rate: 89 (08/23 1217) Intake/Output from previous day:   Intake/Output from this shift:    Labs:  Recent Labs  05/13/2015 0827 05/04/2015 1040  WBC 11.1*  --   HGB 7.9*  --   HCT 26.6*  --   PLT 33*  --   CREATININE 4.78*  --   MG  --  2.2  ALBUMIN 1.9*  --   PROT 6.0*  --   AST 55*  --   ALT 28  --   ALKPHOS 175*  --   BILITOT 0.9  --    Estimated Creatinine Clearance: 12.3 mL/min (by C-G formula based on Cr of 4.78).   Microbiology: No results found for this or any previous visit (from the past 720 hour(s)).  Medical History: Past Medical History  Diagnosis Date  . Renal disorder   . Hypertension   . Dialysis patient   . Secondary hyperparathyroidism (of renal origin)     s/p parathyroidectomy  . Anemia due to chronic disease treated with erythropoietin   . Closed fracture of pubic ramus 08/2013    pathologic (metastatic renal cell ca)  . History of radiation therapy 08/08/13-08/21/13    L-1-L5, ,lt hip/pelvis,prox rt femur  . 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 Renal insufficiency     on dialysis x 10 yrs    Medications:   (Not in a hospital admission) Scheduled:  . [START ON 05/28/2015] calcitRIOL  1 mcg Oral Q T,Th,Sa-HD  . DIALYVITE TABLET  1 tablet Oral Daily  . docusate sodium  100 mg Oral BID  . feeding supplement (ENSURE ENLIVE)  237 mL Oral Q1500  . feeding supplement (NEPRO CARB STEADY)  237 mL Oral BID BM  . fentaNYL  12.5 mcg Transdermal Q72H  . [START ON Jun 10, 2015]  ferrous sulfate  325 mg Oral BID WC  . folic acid  1 mg Intravenous Daily  . nicotine  21 mg Transdermal Daily  . polyethylene glycol  17 g Oral BID  . potassium chloride  40 mEq Oral Once  . sodium chloride  3 mL Intravenous Q12H  . thiamine  100 mg Intravenous Daily   Infusions:  . potassium chloride 10 mEq (05/25/2015 1412)    Assessment: 64yo male with history of ESRD on HD, HTN and metastatic renal cell carcinoma presented to the ED with central abdominal pain. Pharmacy is consulted to review patient's medication list (outpatient and inpatient) for QTc prolonging agents. QTc is currently 529. Pt takes doxepin for sleep at home.  Goal of Therapy:  Avoid QTc prolongation  Plan:  Hold doxepin for sleep Will continue to review inpatient medications that may impact QTc  Andrey Cota. Diona Foley, PharmD Clinical Pharmacist Pager [phone] 05/30/2015,3:03 PM

## 2015-05-26 NOTE — H&P (Signed)
[facility] History and Physical                                                                                    [name], is a 64 y.o. male  MRN: [id number]   DOB - 07-Aug-1951  Admit Date - 05/08/2015  Outpatient Primary MD for the patient is Lance Salvo, MD  Referring MD: [name] / [facility]; [facility] Cardiology; [name] / Nephrology; [name] / Palliative Medicine  With History of -  Past Medical History  Diagnosis Date  . Renal disorder   . Hypertension   . Dialysis patient   . Secondary hyperparathyroidism (of renal origin)     s/p parathyroidectomy  . Anemia due to chronic disease treated with erythropoietin   . Closed fracture of pubic ramus 08/2013    pathologic (metastatic renal cell ca)  . History of radiation therapy 08/08/13-08/21/13    L-1-L5, ,lt hip/pelvis,prox rt femur  . 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 Renal insufficiency     on dialysis x 10 yrs      Past Surgical History  Procedure Laterality Date  . Av fistula placement    . Revison of arteriovenous fistula Right 03/26/2015    Procedure: Exploration and Repair RIGHT Arm Arteriovenous  FISTULA ;  Surgeon: Angelia Mould, MD;  Location: [place];  Service: Vascular;  Laterality: Right;    in for   Chief Complaint  Patient presents with  . Abdominal Pain     HPI This is a 64 year old male patient with a past medical history of chronic kidney disease on dialysis, hypertension, anemia secondary to chronic kidney disease who has been treated for metastatic renal cell carcinoma stage IV since October 2014; he is followed by Dr. Alen Waller. He has multiple areas of bony as well as pulmonary metastasis and mediastinal adenopathy. He had previously been treated with Sutent but this was discontinued in September 2015 because of hepatotoxicity and he was subsequently switched to Afinitor. Last visit with the oncologist was on 04/30/15 which time the progression of  his disease was discussed by the oncologist. It was felt he was a marginal candidate for salvage therapy but was willing and wanting to try different regimen. Patient's rather poor prognosis and limited life expectancy was also reiterated upon by the oncologist but it was unclear that the patient nor the family understood. Based on documentation in the electronic medical records it appears [name] was eventually approved by his insurance and it looks like he has started on this medication in the past few weeks. He presented to the ER today with central abdominal pain for several months but apparently was worse today. It is unclear if the patient presented to dialysis treatment prior to arrival to the ER. Patient is very weak and frail and appears to be at times and inconsistent historian. During both my and the EDP's assessment he was denying vomiting, diarrhea, fevers, nausea, vomiting although he does have very poor appetite and describes the pain as being lower in the abdomen. Initial workup in the ER did reveal a fecal impaction and plans were to disimpact the patient and sent  him to dialysis. Unfortunately the patient developed a 49 beat run of nonsustained ventricular tachycardia with spontaneous return back to sinus rhythm with frequent PVCs.. The EDP also spoke with Dr. Alen Waller and updated him on the ventricular tachycardia and need for admission.  Since arrival patient has been afebrile and vital signs have been stable with blood pressures between 118/79 and 134/87. When questioned patient had no awareness of any tachycardia palpitations during the NSVT episode. His room air saturations have remained 99% or higher since arrival. Electrolyte panel unremarkable except for mild hypokalemia with potassium of 3.3, renal function appears consistent with the patient in need of dialysis today with a BUN of 60 and a creatinine of 4.78. Alk. phosphatase 175, LP and 1.9, lipase 14, AST 55 and total bilirubin 1.6.  White count slightly elevated at 11,100 with normal neutrophils., hemoglobin 7.9 with previous hemoglobin on 7/14 of 10.9. Platelets are also low at 33,000. An EKG had been performed that revealed sinus rhythm, ventricular response 82 bpm, subtle downsloping ST segments in lateral leads consistent with prior EKG, QTC prolonged at 529 ms which is new.    Review of Systems   In addition to the HPI above,  No Fever-chills, myalgias or other constitutional symptoms No Headache, changes with Vision or hearing, new weakness, tingling, numbness in any extremity, No problems swallowing food or Liquids, indigestion/reflux-persistent anorexia No Chest pain, Cough or Shortness of Breath, palpitations, orthopnea or DOE No N/V; no melena or hematochezia, no dark tarry stools No dysuria, hematuria or flank pain No new skin rashes, lesions, masses or bruises, No new joints pains-aches No polyuria, polydypsia or polyphagia,  *A full 10 point Review of Systems was done, except as stated above, all other Review of Systems were negative.  Social History Social History  Substance Use Topics  . Smoking status: Current Every Day Smoker -- 1.00 packs/day for 15 years    Types: Cigarettes  . Smokeless tobacco: Current User  . Alcohol Use: No    Resides at: Private residence  Lives with: Sister and niece  Ambulatory status: Patient unable to adequately quantify but based on history at best ambulates with assistance   Family History Family History  Problem Relation Age of Onset  . Lung cancer       Prior to Admission medications   Medication Sig Start Date End Date Taking? Authorizing Provider  ALPRAZolam (XANAX) 0.25 MG tablet Take 1 tablet (0.25 mg total) by mouth 2 (two) times daily as needed for anxiety. 04/16/15  Yes [name], MD  axitinib (INLYTA) 5 MG tablet Take 1 tablet (5 mg total) by mouth daily. 04/30/15  Yes [name], MD  B Complex-C-Folic Acid (DIALYVITE TABLET) TABS Take 1  tablet by mouth daily. 04/29/15  Yes Historical Provider, MD  calcium carbonate (TUMS - DOSED IN MG ELEMENTAL CALCIUM) 500 MG chewable tablet Chew 3 tablets by mouth daily as needed for heartburn.    Yes Historical Provider, MD  cyclobenzaprine (FLEXERIL) 10 MG tablet TAKE 1 TABLET BY MOUTH THREE TIMES DAILY IF NEEDED FOR MUSCLE SPASM 04/16/15  Yes [name], MD  diphenoxylate-atropine (LOMOTIL) 2.5-0.025 MG per tablet Take 1 tablet by mouth 3 (three) times daily as needed for diarrhea or loose stools.   Yes Historical Provider, MD  doxepin (SINEQUAN) 25 MG capsule Take 25 mg by mouth at bedtime.   Yes Historical Provider, MD  feeding supplement, ENSURE ENLIVE, (ENSURE ENLIVE) LIQD Take 237 mLs by mouth daily at 3 pm.  04/16/15  Yes Annita Brod, MD  ferrous sulfate 325 (65 FE) MG EC tablet Take 1 tablet (325 mg total) by mouth 2 (two) times daily with breakfast and lunch. 10/18/13  Yes Concha Norway, MD  HYDROcodone-acetaminophen (NORCO/VICODIN) 5-325 MG per tablet Take 1-2 tablets by mouth 2 (two) times daily as needed. 05/15/15  Yes [name], MD  losartan (COZAAR) 100 MG tablet Take 100 mg by mouth daily. 04/07/15  Yes Historical Provider, MD  Nutritional Supplements (FEEDING SUPPLEMENT, NEPRO CARB STEADY,) LIQD Take 237 mLs by mouth 2 (two) times daily between meals. 04/16/15  Yes Annita Brod, MD  oxyCODONE-acetaminophen (ROXICET) 5-325 MG per tablet Take 1-2 tablets by mouth every 4 (four) hours as needed for severe pain. 03/26/15  Yes Angelia Mould, MD  prochlorperazine (COMPAZINE) 10 MG tablet Take 1 tablet (10 mg total) by mouth every 8 (eight) hours as needed for nausea or vomiting. 01/26/15  Yes [name], MD  Skin Protectants, Misc. (EUCERIN) cream Apply 1 application topically as needed for dry skin.   Yes Historical Provider, MD  traMADol (ULTRAM) 50 MG tablet Take 1 tablet by mouth no more than twice a day as needed for pain 02/13/15  Yes Adrena E Johnson, PA-C     No Known Allergies  Physical Exam  Vitals  Blood pressure 128/87, pulse 88, temperature 97.4 F (36.3 C), temperature source Oral, resp. rate 15, height 6' (1.829 m), weight 121 lb (54.885 kg), SpO2 100 %.   General:  In no acute distress, appears chronically ill  Psych:  Very affect, heme stent answer yes to most questions postulated to him, Awake but lethargic Oriented X to at least name and likely to place-not to year and uncertain as to situation   Neuro:   No focal neurological deficits, CN II through XII intact, Strength 4/5 all 4 extremities, Sensation intact all 4 extremities.  ENT:  Ears and Eyes appear Normal, Conjunctivae clear, PER. Moist oral mucosa without erythema or exudates.  Neck:  Supple, No lymphadenopathy appreciated  Respiratory:  Symmetrical chest wall movement, Good air movement bilaterally, CTAB. Room Air  Cardiac:  RRR, No Murmurs, no LE edema noted, no JVD, No carotid bruits, peripheral pulses palpable at 2+  Abdomen:  Positive bowel sounds with tinkling bowel sounds over the suprapubic region, Soft, Non tender, Non distended,  No masses appreciated, no obvious hepatosplenomegaly  Skin:  No Cyanosis, Normal Skin Turgor, No Skin Rash or Bruise. Patient does have ulcerated lesions over the right upper extremity dialysis access consistent with wounds from prior needle placement.  Extremities: Symmetrical without obvious trauma or injury,  no effusions.  Data Review  CBC  Recent Labs Lab 05/29/2015 0827  WBC 11.1*  HGB 7.9*  HCT 26.6*  PLT 33*  MCV 73.5*  MCH 21.8*  MCHC 29.7*  RDW 32.8*  LYMPHSABS 1.2  MONOABS 0.9  EOSABS 0.0  BASOSABS 0.4*    Chemistries   Recent Labs Lab 05/14/2015 0827 05/30/2015 1040  NA 138  --   K 3.3*  --   CL 97*  --   CO2 23  --   GLUCOSE 97  --   BUN 60*  --   CREATININE 4.78*  --   CALCIUM 8.8*  --   MG  --  2.2  AST 55*  --   ALT 28  --   ALKPHOS 175*  --   BILITOT 0.9  --     estimated  creatinine clearance  is 12.3 mL/min (by C-G formula based on Cr of 4.78).  No results for input(s): TSH, T4TOTAL, T3FREE, THYROIDAB in the last 72 hours.  Invalid input(s): FREET3  Coagulation profile No results for input(s): INR, PROTIME in the last 168 hours.  No results for input(s): DDIMER in the last 72 hours.  Cardiac Enzymes No results for input(s): CKMB, TROPONINI, MYOGLOBIN in the last 168 hours.  Invalid input(s): CK  Invalid input(s): POCBNP  Urinalysis No results found for: COLORURINE, APPEARANCEUR, LABSPEC, PHURINE, GLUCOSEU, HGBUR, BILIRUBINUR, KETONESUR, PROTEINUR, UROBILINOGEN, NITRITE, LEUKOCYTESUR  Imaging results:   Ct Abdomen Pelvis Wo Contrast  05/21/2015   CLINICAL DATA:  Central abdominal pain  EXAM: CT ABDOMEN AND PELVIS WITHOUT CONTRAST  TECHNIQUE: Multidetector CT imaging of the abdomen and pelvis was performed following the standard protocol without IV contrast.  COMPARISON:  04/15/2015  FINDINGS: Sagittal images of the spine shows again metastatic bone lesions. Lytic lesion and pathologic fracture of L2 vertebral body again noted. There is a sclerotic lesion in T8 vertebral body. Lytic lesion is noted in L5 vertebral body. Again noted lytic lesions bilateral hips. There is small right pleural effusion. There is infiltrate with air bronchogram in right lower lobe. Spiculated nodule in right lower lobe axial image 11 measures 1.2 cm. Metastatic disease cannot be excluded.  There is trace perihepatic ascites. The study is limited without IV contrast. Status postcholecystectomy. Atherosclerotic calcifications of abdominal aorta, SMA origin, bilateral renal artery origin and bilateral common iliac arteries.  There is no small bowel obstruction. Moderate stool and gas noted within colon. Trace ascites is noted in left paracolic gutter. There is no pericecal inflammation. The terminal ileum is unremarkable.  There is significant thickening of the wall of under distended  urinary bladder. Chronic inflammation cystitis or neoplastic process cannot be excluded. Significant stool noted within distended rectum. Measures about 7.7 cm in diameter highly suspicious for fecal impaction. There is destructive lesion in left anterior acetabulum with a soft tissue component. The soft tissue component measures at least 2.2 cm highly suspicious for worsening metastatic disease. Again noted lytic lesion in right posterior iliac bone measures 2.4 cm with progression from prior exam. Lytic lesion in anterior aspect of left iliac bone measures 1.8 cm.  Bilateral innumerable liver cysts are again noted. Bilateral renal cortical thinning. Stable mixed density lesion in right kidney measures 3.5 cm. Unenhanced spleen, pancreas and adrenal glands are unremarkable.  There is no small bowel obstruction.  No free abdominal air.  IMPRESSION: 1. There is small right pleural effusion. Right lower lobe consolidation with air bronchogram. Pneumonia cannot be excluded. There is a nodule in right lower lobe measures 1.2 cm. Metastatic disease cannot be excluded. 2. Trace perihepatic ascites. 3. Again noted multiple bone lesions consistent with extensive metastatic disease. Some worsening of metastatic disease in within pelvis. 4. Again noted bilateral innumerable renal cysts. Stable complex lesion in left kidney. 5. No small bowel obstruction. 6. Extensive atherosclerotic vascular calcifications. 7. Significant stool noted within rectum measures 7.7 cm in diameter suspicious for fecal impaction.   Electronically Signed   By: Lahoma Crocker M.D.   On: 05/30/2015 09:01     EKG: (Independently reviewed) sinus rhythm, 82 bpm, QTC 529 ms, stable subtle downsloping ST segments with J-point elevation in leads V4 through V6 and unchanged from previous EKG, isolated PVC  Telemetry strips: Telemetry strips reviewed from episode of NSVT with 49 beats counted   Assessment & Plan  Principal Problem:   QT  prolongation/NSVT  -Admit to stepdown unit -Consult cardiology -Check echocardiogram; patient with chemotherapy on 2 different drugs in the past 12 months and both of these drugs can cause cardiomyopathy -Potassium slightly low at 3.3 so replete; likely will also receive potassium enriched bath during hemodialysis -Check magnesium and replace if indicated -Discontinue offending drugs: Patient was on doxepin and Compazine prior to admission and these have been discontinued since they can lead to QT prolongation  Active Problems:   Obstipation/Fecal impaction/ Abdominal pain, generalized -Soapsuds enema 1 -Begin bowel regimen with twice a day Colace as well as twice a day MiraLAX -Can also use Chronulac for more severe obstipation -Patient will need to continue regular narcotic use because of severe bony metastatic disease    Thrombocytopenia -New problem and suspect is not spurious lab result -Likely from chemotherapy but need to rule out hemolysis -Have pathologist review peripheral smear; check To globin and LDH -Patient has had significant drop in hemoglobin since July (see below)  -Repeat CBC in a.m.    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 Alen Waller made aware of admission and has been added to the consultant treatment team -Continue preadmission pain and comfort meds noting need to discontinue certain medications because they can contribute QT prolongation -Have broken up  patient's typical narcotics to have Tylenol separate from the narcotic portion and am allowing oxycodone 5-10 mg every 4 hours as needed for moderate pain; also continue Ultram and have added a low-dose fentanyl patch -Have consulted palliative medicine to assist with pain management but likely will also need to address goals of care given NSVT of uncertain origin that certainly could be related to a therapy induced cardiomyopathy; in addition presentation with thrombocytopenia also points to poor prognosis and likely inability to tolerate future chemotherapy    Hepatitis C -LFTs are stable and at baseline for this patient    GERD  -Not on medications prior to admission    Protein-calorie malnutrition, severe -Multifactorial etiology as above; cancer, obstipation, occasions -Continue supplements    DVT Prophylaxis: SCDs since has platelets less than 100,000  Family Communication:   No family at bedside  Code Status:  Currently a full code-code status needs to be addressed this admission at discretion of oncologist  Condition: Guarded   Discharge disposition: Likely will return to home once above issues are resolved-potentially could return with hospice care  Time spent in minutes : 60      ELLIS,ALLISON L. ANP on 05/21/2015 at 11:19 AM  Between 7am to 7pm - Pager - [phone]  After 7pm go to [email or web address] - password [email or web address]  And look for the night coverage person covering me after hours  [facility]  Addendum  I personally saw and evaluated patient on 06/03/2015 and agree with above findings. Patient is a 64 year old gentleman with a past medical history of end-stage renal disease, history of metastatic renal cell carcinoma diagnosed in October 2014, with history of undergoing chemotherapy. He has admitted to the medicine service from  04/15/2015 through 04/16/2015 at which time he was transfused with blood for symptomatic anemia likely secondary to chemotherapy.  He presented to the emergency department with complaints of abdominal pain. While in the emergency room he had a 71 beat of nonsustained ventricular tachycardia. Labs showing potassium of 3.3 and EKG showing QT prolongation (having QTC of 529). On exam ease chronically ill-appearing, thin, cachectic, appears somewhat confused, difficult to obtain history. He had a regular rate and rhythm normal S1-S2, did not hear rubs. Lungs were clear to auscultation bilaterally. Will replace his potassium aiming for K greater than 4.0. Will stop doxepin and Compazine. Obtain transthoracic echocardiogram. Cardiology consulted. With regard to history of metastatic renal cell carcinoma of acute benefit from a palliative care consult. Will monitor closely in the step down unit.

## 2015-05-27 DIAGNOSIS — I4581 Long QT syndrome: Secondary | ICD-10-CM

## 2015-05-27 DIAGNOSIS — C7951 Secondary malignant neoplasm of bone: Secondary | ICD-10-CM

## 2015-05-27 DIAGNOSIS — R1084 Generalized abdominal pain: Secondary | ICD-10-CM

## 2015-05-27 DIAGNOSIS — R7989 Other specified abnormal findings of blood chemistry: Secondary | ICD-10-CM

## 2015-05-27 DIAGNOSIS — I519 Heart disease, unspecified: Secondary | ICD-10-CM

## 2015-05-27 DIAGNOSIS — D631 Anemia in chronic kidney disease: Secondary | ICD-10-CM

## 2015-05-27 DIAGNOSIS — N189 Chronic kidney disease, unspecified: Secondary | ICD-10-CM

## 2015-05-27 DIAGNOSIS — E43 Unspecified severe protein-calorie malnutrition: Secondary | ICD-10-CM

## 2015-05-27 DIAGNOSIS — I472 Ventricular tachycardia: Principal | ICD-10-CM

## 2015-05-27 LAB — PATHOLOGIST SMEAR REVIEW

## 2015-05-27 LAB — HAPTOGLOBIN: HAPTOGLOBIN: 21 mg/dL — AB (ref 34–200)

## 2015-05-27 LAB — T4, FREE: FREE T4: 1.47 ng/dL — AB (ref 0.61–1.12)

## 2015-05-27 MED ORDER — BISACODYL 10 MG RE SUPP
10.0000 mg | Freq: Once | RECTAL | Status: DC
Start: 2015-05-27 — End: 2015-05-27

## 2015-05-27 MED ORDER — HYDROMORPHONE HCL 1 MG/ML IJ SOLN: 0.5000 mg | INTRAMUSCULAR | Status: DC | PRN

## 2015-05-27 MED ORDER — BOOST / RESOURCE BREEZE PO LIQD: 1.0000 | Freq: Three times a day (TID) | ORAL | Status: DC

## 2015-05-27 MED ORDER — LORAZEPAM 2 MG/ML IJ SOLN: 1.0000 mg | INTRAMUSCULAR | Status: DC | PRN

## 2015-05-27 MED ORDER — HYDROMORPHONE HCL 1 MG/ML IJ SOLN
0.5000 mg | INTRAMUSCULAR | Status: DC | PRN
  Filled 2015-05-27: qty 1

## 2015-05-28 LAB — TYPE AND SCREEN
ABO/RH(D): B POS
ANTIBODY SCREEN: POSITIVE
DAT, IgG: NEGATIVE
DONOR AG TYPE: NEGATIVE
Donor AG Type: NEGATIVE
UNIT DIVISION: 0
UNIT DIVISION: 0

## 2015-05-28 LAB — T3: T3, Total: 182 ng/dL — ABNORMAL HIGH (ref 71–180)

## 2015-06-03 ENCOUNTER — Ambulatory Visit: Payer: Medicare Other | Admitting: Oncology

## 2015-06-03 ENCOUNTER — Other Ambulatory Visit: Payer: Medicare Other

## 2015-06-04 NOTE — Progress Notes (Addendum)
Patient made a DNR this am,per patient request, patient actively passing, called into room around 1020 am , family at bedside, Patient passed at 54 with 2 RN present. Confirmed time of dealth at 60 by Earnestine Leys, RN and Clint Lipps, Therapist, sports. [place] with family . Dr. Sheran Fava made aware of patient's passing.

## 2015-06-04 NOTE — Care Management Note (Signed)
Case Management Note  Patient Details  Name: [name] MRN: [id number] Date of Birth: [birth year]  Subjective/Objective:  Pt admitted for QTC prolongation with ventricular tachycardia                  Action/Plan: Pt expired @ 1025 am.   Expected Discharge Date:                  Expected Discharge Plan:  Home w Hospice Care  In-House Referral:  NA  Discharge planning Services  CM Consult  Post Acute Care Choice:  NA Choice offered to:  NA  DME Arranged:    DME Agency:     HH Arranged:    HH Agency:     Status of Service:  Completed, signed off  Medicare Important Message Given:    Date Medicare IM Given:    Medicare IM give by:    Date Additional Medicare IM Given:    Additional Medicare Important Message give by:     If discussed at [place] of Stay Meetings, dates discussed:    Additional Comments:  Bethena Roys, RN 06/04/15, 12:13 PM

## 2015-06-04 NOTE — Discharge Summary (Signed)
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, chronic kidney disease on hemodialysis, hepatitis C, hypertension who presented to the ER for abdominal pain. He had an episode of nonsustained ventricular tachycardia lasting approximate 49 beats. He was seen by cardiology and by nephrology. During his conversation with nephrology, the patient declined further hemodialysis but later reported that he wanted to live. Hemodialysis was not given on 06-22-15 to give the patient and his family more time to think about these issues.  Hospital Course:   Metastatic renal cell carcinoma, prognosis was poor. The patient had been treated by [name], but it had progression of his disease despite trying new chemotherapy. He had increasing abdominal pain, nausea, weight loss, failure to thrive. On the date of his death, I discussed with him his goals of care and he had relayed to me that he wanted to be as comfortable as possible and to stop hemodialysis. He wanted to change from full code to DO NOT RESUSCITATE and consider transfer to inpatient hospice. Conversation I had with the patient was discussed with his sister on the phone and then again  with his sister and mother who had arrived at bedside.  During our conversation, he became unresponsive with agonal breathing, hypotension. The sister and mother understood that the patient was dying and agreed with comfort measures, DO NOT RESUSCITATE. He was made nothing by mouth, he was ordered IV medications for pain and anxiety and he died about an hour later.  I suspect that he died of complications from his renal cell carcinoma. He did not appear to have arrhythmia on the heart monitor however it is possible that he may have had a pulmonary embolism related to his malignancy. He was not on anticoagulation secondary to his thrombocytopenia, however he may have been still hypercoagulable.  QTC prolongation with ventricular tachycardia. Cardiology counted 70 beats of V. tach on telemetry with a consulted initially. His medications that could prolong the QTC such as doxepin and Compazine were discontinued. His QTC decreased to 477. Some of his chemotherapy medications can also contribute to cardiomyopathy and prolonged QTC. See below.  Possible acute systolic heart failure with ejection fraction of 35-40%, diffuse hypokinesis, moderate LVH, grade 1 diastolic dysfunction, increased pulmonary artery pressure with peak PA pressure of 47 mmHg. The patient was on 2 different chemotherapeutic agents which can cause systolic heart failure/cardiomyopathy.  Obstipation/fecal impaction/abdominal pain. He had a rectal disimpaction was started on lactulose and stool softeners and had 3 bowel movements in the day prior to his death.  Anemia and thrombocytopenia related to his malignancy and chemotherapy. He had an LDH of 759 which suggests that he may also have had some hemolysis. He died before further workup could be completed.  End-stage renal disease  on hemodialysis. The patient declined hemodialysis on 8/23.  Anemia of chronic kidney disease, hemoglobin had trended down from his baseline. He may have been  developing DIC or TTP related to his malignancy.  Hypotension, related to his multiple medical problems. His blood pressure medications were held.  Hepatitis C, liver function tests were at baseline..  GERD, not on medication prior to admission.  Severe protein calorie malnutrition related to cancer, end-stage renal disease, obstipation. He was started on supplements and nutrition was consulted.  Possible hypothyroidism versus sick euthyroid. His TSH was 10.896 and his free T4 was 1.47. The case was discussed with endocrinology who felt that his TFTs should repeated in a few weeks, but this is unlikely to be related to his ventricular tachycardia and heart failure.  Time: 45 minutes total time spent with family, patient, and with consultants.  Signed:  Janece Canterbury  [facility] May 28, 2015, 11:04 AM

## 2015-06-04 NOTE — Progress Notes (Signed)
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 Alen Waller, chronic kidney disease on hemodialysis, hepatitis C, hypertension who presented to the ER for abdominal pain.   He had an episode of nonsustained ventricular tachycardia lasting approximate 49 beats.  He was seen by cardiology and by nephrology. During his conversation with nephrology, the patient declined further hemodialysis but later reported that he wanted to live. Hemodialysis was not given on 05/10/2015 to give the patient and his family more time to think about these issues.  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 Alen Waller has been notified of patient's admission, and would appreciate assistance with goals of care conversation - Due to nausea, he has not been able to tolerate oral pain medication - Increased allotted to every 2 hours when necessary for pain or shortness of breath - During my conversation with him this morning, the patient stated that he did not want hemodialysis and he understands that he would die in 1-2 weeks. He would have clouded thinking and follow sleep and not wake up. He states he feels miserable and just wants his pain, nausea controlled. He is familiar with inpatient hospice and is amenable to speaking with the social worker about these options.  - After my conversation with the patient, I spoke to his sister who wants to have an opportunity to speak to her brother about his goals of care.  Since the sister seems to feel that he should continue hemodialysis, I have left the palliative care consultation.   -  Have changed his CODE STATUS from full code to DO NOT RESUSCITATE .    QT prolongation/NSVT  -  Appreciate cardiology  assistance -  Discontinue offending drugs: Patient was on doxepin and Compazine prior to admission and these have been discontinued since they can lead to QT prolongation -  For comfort at this point will d/c telemetry  Possible acute systolic heart failure with ejection fraction of 35-40% (previous EF 60%), diffuse hypokinesis and moderate LVH, grade 1 diastolic dysfunction, mildly increased pulmonary artery pressure with peak pressure 47 mmHg. -  Patient with chemotherapy on 2 different drugs in the past 12 months and both of these drugs can cause cardiomyopathy   Obstipation/Fecal impaction/ Abdominal pain, generalized, had three BMs yesterday - Continue Colace as well as twice a day MiraLAX  Anemia and thrombocytopenia -  LDH 759, elevated -  No labs pending further [place] conversations -  F/u pathology   ESRD on hemodialysis,  -Nephrology to please talk to patient about HD again today -Typical dialysis days are T-Th-Sa -From a respiratory standpoint is stable   Anemia in chronic kidney disease -  No labs pending [place] conversation with palliative care and sister   Hypertension, BP low normal - hold BP medications   Hepatitis C -LFTs are stable and at baseline for this patient   GERD  -Not on medications prior to admission   Protein-calorie malnutrition, severe -Multifactorial etiology as above; cancer, obstipation, occasions -Continue supplements  Possible hyperthyroidism -  TSH 10.896 -  fT4 1.47  Diet:  Dysphagia 3 Access:  PIV IVF:  Off Proph:  Thrombocytopenic  Code Status: Discussed with patient today, will change to DO NOT RESUSCITATE Family Communication: Spoke with patient and his sister he  was going to come in to speak with him personally. Disposition Plan: Although my conversation with the patient today was to focus on comfort and transition to possible inpatient hospice, there may be some additional changes to the plan after he speaks with his sister,  so I have left the palliative care consultation and anticipate further conversations with oncology, nephrology, and cardiology later today.  Consultants:  Cardiology, Dr. Gwenlyn Waller  Nephrology, Dr. Melvia Waller  Oncology, Dr. Alen Waller  Procedures:  Echocardiogram  CT abdomen and pelvis  Antibiotics:  None   HPI/Subjective:  Patient states that he has severe abdominal pain, nausea, poor appetite. He states that he just hurts all over.    Objective: Filed Vitals:   05/04/2015 2012 05/24/2015 2050 Jun 17, 2015 0000 06-17-15 0457  BP: 134/84  126/70 108/74  Pulse:   78 90  Temp: 97.6 F (36.4 C)  98.2 F (36.8 C) 97.7 F (36.5 C)  TempSrc: Oral  Oral Oral  Resp: 18  18 22   Height:      Weight:    52.799 kg (116 lb 6.4 oz)  SpO2:  100% 98% 100%    Intake/Output Summary (Last 24 hours) at 2015-06-17 0828 Last data filed at Jun 17, 2015 0300  Gross per 24 hour  Intake    100 ml  Output      3 ml  Net     97 ml   Filed Weights   05/10/2015 0632 06-17-2015 0457  Weight: 54.885 kg (121 lb) 52.799 kg (116 lb 6.4 oz)   Body mass index is 15.78 kg/(m^2).  Exam:   General:  Frail male, No acute distress  HEENT:  NCAT, MMM  Cardiovascular:  RRR, nl S1, S2 no mrg, 2+ pulses, warm extremities  Respiratory:  Diminished at the bilateral bases, no wheezes, rales, rhonchi, no increased WOB  Abdomen:   NABS, soft, tender to palpation in the right upper quadrant or right peri-umbilical area, no rebound or guarding  MSK:   Normal tone and bulk, 2+ pitting LEE  Neuro:  Grossly moves all extremities  Data Reviewed: Basic Metabolic Panel:  Recent Labs Lab 05/08/2015 0827 05/21/2015 1040  NA 138  --   K 3.3*  --   CL 97*  --   CO2 23  --   GLUCOSE 97  --   BUN 60*  --   CREATININE 4.78*  --   CALCIUM 8.8*  --   MG  --  2.2   Liver Function Tests:  Recent Labs Lab 05/14/2015 0827  AST 55*  ALT 28  ALKPHOS 175*  BILITOT 0.9  PROT 6.0*  ALBUMIN 1.9*    Recent Labs Lab 05/29/2015 0827   LIPASE 14*   No results for input(s): AMMONIA in the last 168 hours. CBC:  Recent Labs Lab 05/05/2015 0827 05/16/2015 2029  WBC 11.1* 11.4*  NEUTROABS 8.6* 7.6  HGB 7.9* 8.6*  HCT 26.6* 28.9*  MCV 73.5* 74.1*  PLT 33* 38*    Recent Results (from the past 240 hour(s))  MRSA PCR Screening     Status: None   Collection Time: 05/08/2015  8:48 PM  Result Value Ref Range Status   MRSA by PCR NEGATIVE NEGATIVE Final    Comment:        The GeneXpert MRSA Assay (FDA approved for NASAL specimens only), is one component of a comprehensive MRSA colonization surveillance program. It is not intended to diagnose MRSA infection nor to guide or monitor treatment for MRSA infections.  Studies: Ct Abdomen Pelvis Wo Contrast  05/08/2015   CLINICAL DATA:  Central abdominal pain  EXAM: CT ABDOMEN AND PELVIS WITHOUT CONTRAST  TECHNIQUE: Multidetector CT imaging of the abdomen and pelvis was performed following the standard protocol without IV contrast.  COMPARISON:  04/15/2015  FINDINGS: Sagittal images of the spine shows again metastatic bone lesions. Lytic lesion and pathologic fracture of L2 vertebral body again noted. There is a sclerotic lesion in T8 vertebral body. Lytic lesion is noted in L5 vertebral body. Again noted lytic lesions bilateral hips. There is small right pleural effusion. There is infiltrate with air bronchogram in right lower lobe. Spiculated nodule in right lower lobe axial image 11 measures 1.2 cm. Metastatic disease cannot be excluded.  There is trace perihepatic ascites. The study is limited without IV contrast. Status postcholecystectomy. Atherosclerotic calcifications of abdominal aorta, SMA origin, bilateral renal artery origin and bilateral common iliac arteries.  There is no small bowel obstruction. Moderate stool and gas noted within colon. Trace ascites is noted in left paracolic gutter. There is no pericecal inflammation. The terminal ileum is unremarkable.  There is  significant thickening of the wall of under distended urinary bladder. Chronic inflammation cystitis or neoplastic process cannot be excluded. Significant stool noted within distended rectum. Measures about 7.7 cm in diameter highly suspicious for fecal impaction. There is destructive lesion in left anterior acetabulum with a soft tissue component. The soft tissue component measures at least 2.2 cm highly suspicious for worsening metastatic disease. Again noted lytic lesion in right posterior iliac bone measures 2.4 cm with progression from prior exam. Lytic lesion in anterior aspect of left iliac bone measures 1.8 cm.  Bilateral innumerable liver cysts are again noted. Bilateral renal cortical thinning. Stable mixed density lesion in right kidney measures 3.5 cm. Unenhanced spleen, pancreas and adrenal glands are unremarkable.  There is no small bowel obstruction.  No free abdominal air.  IMPRESSION: 1. There is small right pleural effusion. Right lower lobe consolidation with air bronchogram. Pneumonia cannot be excluded. There is a nodule in right lower lobe measures 1.2 cm. Metastatic disease cannot be excluded. 2. Trace perihepatic ascites. 3. Again noted multiple bone lesions consistent with extensive metastatic disease. Some worsening of metastatic disease in within pelvis. 4. Again noted bilateral innumerable renal cysts. Stable complex lesion in left kidney. 5. No small bowel obstruction. 6. Extensive atherosclerotic vascular calcifications. 7. Significant stool noted within rectum measures 7.7 cm in diameter suspicious for fecal impaction.   Electronically Signed   By: Lahoma Crocker M.D.   On: 05/26/2015 09:01    Scheduled Meds: . bisacodyl  10 mg Rectal Once  . [START ON 05/28/2015] calcitRIOL  1 mcg Oral Q T,Th,Sa-HD  . docusate sodium  100 mg Oral BID  . feeding supplement  1 Container Oral TID BM  . ferrous sulfate  325 mg Oral BID WC  . folic acid  1 mg Intravenous Daily  . multivitamin  1  tablet Oral Daily  . nicotine  21 mg Transdermal Daily  . polyethylene glycol  17 g Oral BID  . potassium chloride  40 mEq Oral Once  . sodium chloride  3 mL Intravenous Q12H  . 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 If 7PM-7AM, please contact night-coverage at [email or web address], password [facility] 06/15/15, 8:28 AM  LOS: 1 day

## 2015-06-04 NOTE — Progress Notes (Signed)
[name] referral made.  Referral number [id number].  Instructed to perform eye care.  Sanda Linger

## 2015-06-04 NOTE — Evaluation (Signed)
Clinical/Bedside Swallow Evaluation Patient Details  Name: Lenox Bink MRN: [id number] Date of Birth: Oct 27, 1950  Today's Date: 06/19/2015 Time: SLP Start Time (ACUTE ONLY): 0744 SLP Stop Time (ACUTE ONLY): 0802 SLP Time Calculation (min) (ACUTE ONLY): 18 min  Past Medical History:  Past Medical History  Diagnosis Date  . Renal disorder   . Hypertension   . Dialysis patient   . Secondary hyperparathyroidism (of renal origin)     s/p parathyroidectomy  . Anemia due to chronic disease treated with erythropoietin   . Closed fracture of pubic ramus 08/2013    pathologic (metastatic renal cell ca)  . History of radiation therapy 08/08/13-08/21/13    L-1-L5, ,lt hip/pelvis,prox rt femur  . 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 Renal insufficiency     on dialysis x 10 yrs   Past Surgical History:  Past Surgical History  Procedure Laterality Date  . Av fistula placement    . Revison of arteriovenous fistula Right 03/26/2015    Procedure: Exploration and Repair RIGHT Arm Arteriovenous  FISTULA ;  Surgeon: Angelia Mould, MD;  Location: [facility] OR;  Service: Vascular;  Laterality: Right;   HPI:  Pt PMH + renal disease had been undergoing HD,  metastatic renal cancer, adm with QT prolongation.   Imaging studies show significant metastatic dx.   Assessment / Plan / Recommendation Clinical Impression  Limited assessment completed due to pt declining to accept more than a single bolus of gingerale and applesauce.  Delay oral transiitng noted - ? behavioral due to nausea vs sensorimotor.  No indication of airway compromise nor signs of pharyngeal residuals.  Pt admits to difficulty swallowing large pills sensing they lodge in his throat.  Pt agreeable to consume pills with pudding or applesauce.  SLP did not locate dentures/partial in pt's room and he has poor dentition, therefore recommend dys3/thin diet.  Note palliative referral pending as pt reportedly declined to have HD  yesterday.     Aspiration Risk    moderate   Diet Recommendation Dysphagia 3 (Mech soft);Thin   Medication Administration: Whole meds with puree Compensations: Slow rate;Small sips/bites    Other  Recommendations Oral Care Recommendations: Oral care BID   Follow Up Recommendations       Frequency and Duration min 1 x/week  1 week   Pertinent Vitals/Pain Afebrile, decreased     Swallow Study Prior Functional Status   see hhx, pt reports he eats regular/thin diet prior to admit    General Date of Onset: 06-19-2015 Other Pertinent Information: Pt PMH + renal disease had been undergoing HD,  metastatic renal cancer, adm with QT prolongation.   Type of Study: Bedside swallow evaluation Diet Prior to this Study: Other (Comment) (clears) Temperature Spikes Noted: No Respiratory Status: Room air History of Recent Intubation: No Behavior/Cognition: Alert;Cooperative (flat affect) Oral Cavity - Dentition: Poor condition;Missing dentition Self-Feeding Abilities: Able to feed self Patient Positioning: Upright in bed Baseline Vocal Quality: Low vocal intensity Volitional Cough: Strong Volitional Swallow: Able to elicit    Oral/Motor/Sensory Function Overall Oral Motor/Sensory Function:  (generalized weakness)   Ice Chips Ice chips: Not tested   Thin Liquid Thin Liquid: Impaired Presentation: Straw Oral Phase Impairments: Impaired anterior to posterior transit Oral Phase Functional Implications: Prolonged oral transit Pharyngeal  Phase Impairments: Suspected delayed Swallow    Nectar Thick Nectar Thick Liquid: Not tested   Honey Thick Honey Thick Liquid: Not tested   Puree Puree: Impaired Presentation: Spoon Oral Phase Impairments: Reduced  lingual movement/coordination;Impaired anterior to posterior transit Oral Phase Functional Implications: Prolonged oral transit   Solid   GO    Other Comments: pt declined to attempt solids stating he didn't want anything       Luanna Salk, [facility] SLP [phone]

## 2015-06-04 NOTE — Progress Notes (Signed)
I came back to speak with the family at bedside and during my interview, the patient suddenly became unresponsive. Sister and mother were present and agreed that the patient is dying. They are in agreement with DO NOT RESUSCITATE and full comfort measures. Anticipate in-hospital death.

## 2015-06-04 NOTE — Progress Notes (Signed)
Chaplain responded to spiritual care consult. Upon chaplain arrival, pt actively dying. Chaplain read scripture and offered prayer. Chaplain left to attain funeral home resources, upon chaplain reentrance pt passed away. Chaplain provided emotional support to pt family. Some family members very distraught. Pt family pastor present and caring for family spiritually. Pt family does not know funeral arrangements at this time. Pt placement card given to pt family. Page chaplain as needed.    06/19/2015 1100  Clinical Encounter Type  Visited With Patient and family together;Health care provider  Visit Type Initial;Spiritual support  Referral From Nurse  Spiritual Encounters  Spiritual Needs Emotional;Grief support  Stress Factors  Family Stress Factors Loss  Jj Enyeart, Barbette Hair, Chaplain June 19, 2015 11:20 AM

## 2015-06-04 NOTE — Progress Notes (Signed)
Nutrition Brief Note  Chart reviewed due to MST score of 2. Pt now transitioning to comfort care.  No nutrition interventions warranted at this time.  Please consult as needed.    Molli Barrows, RD, LDN, [place] Pager [phone] After Hours Pager [phone]

## 2015-06-04 DEATH — deceased

## 2015-08-27 IMAGING — CR DG CHEST 2V
2 series · 2 of 2 positions shown · non-contrast
Comparison: 07/31/2013; correlation CT chest 10/06/2014

CLINICAL DATA: Hemoptysis, history hypertension, end-stage renal
disease on dialysis, smoking, metastatic renal cell carcinoma to
bone

EXAM:
CHEST  2 VIEW

[w chest pa]
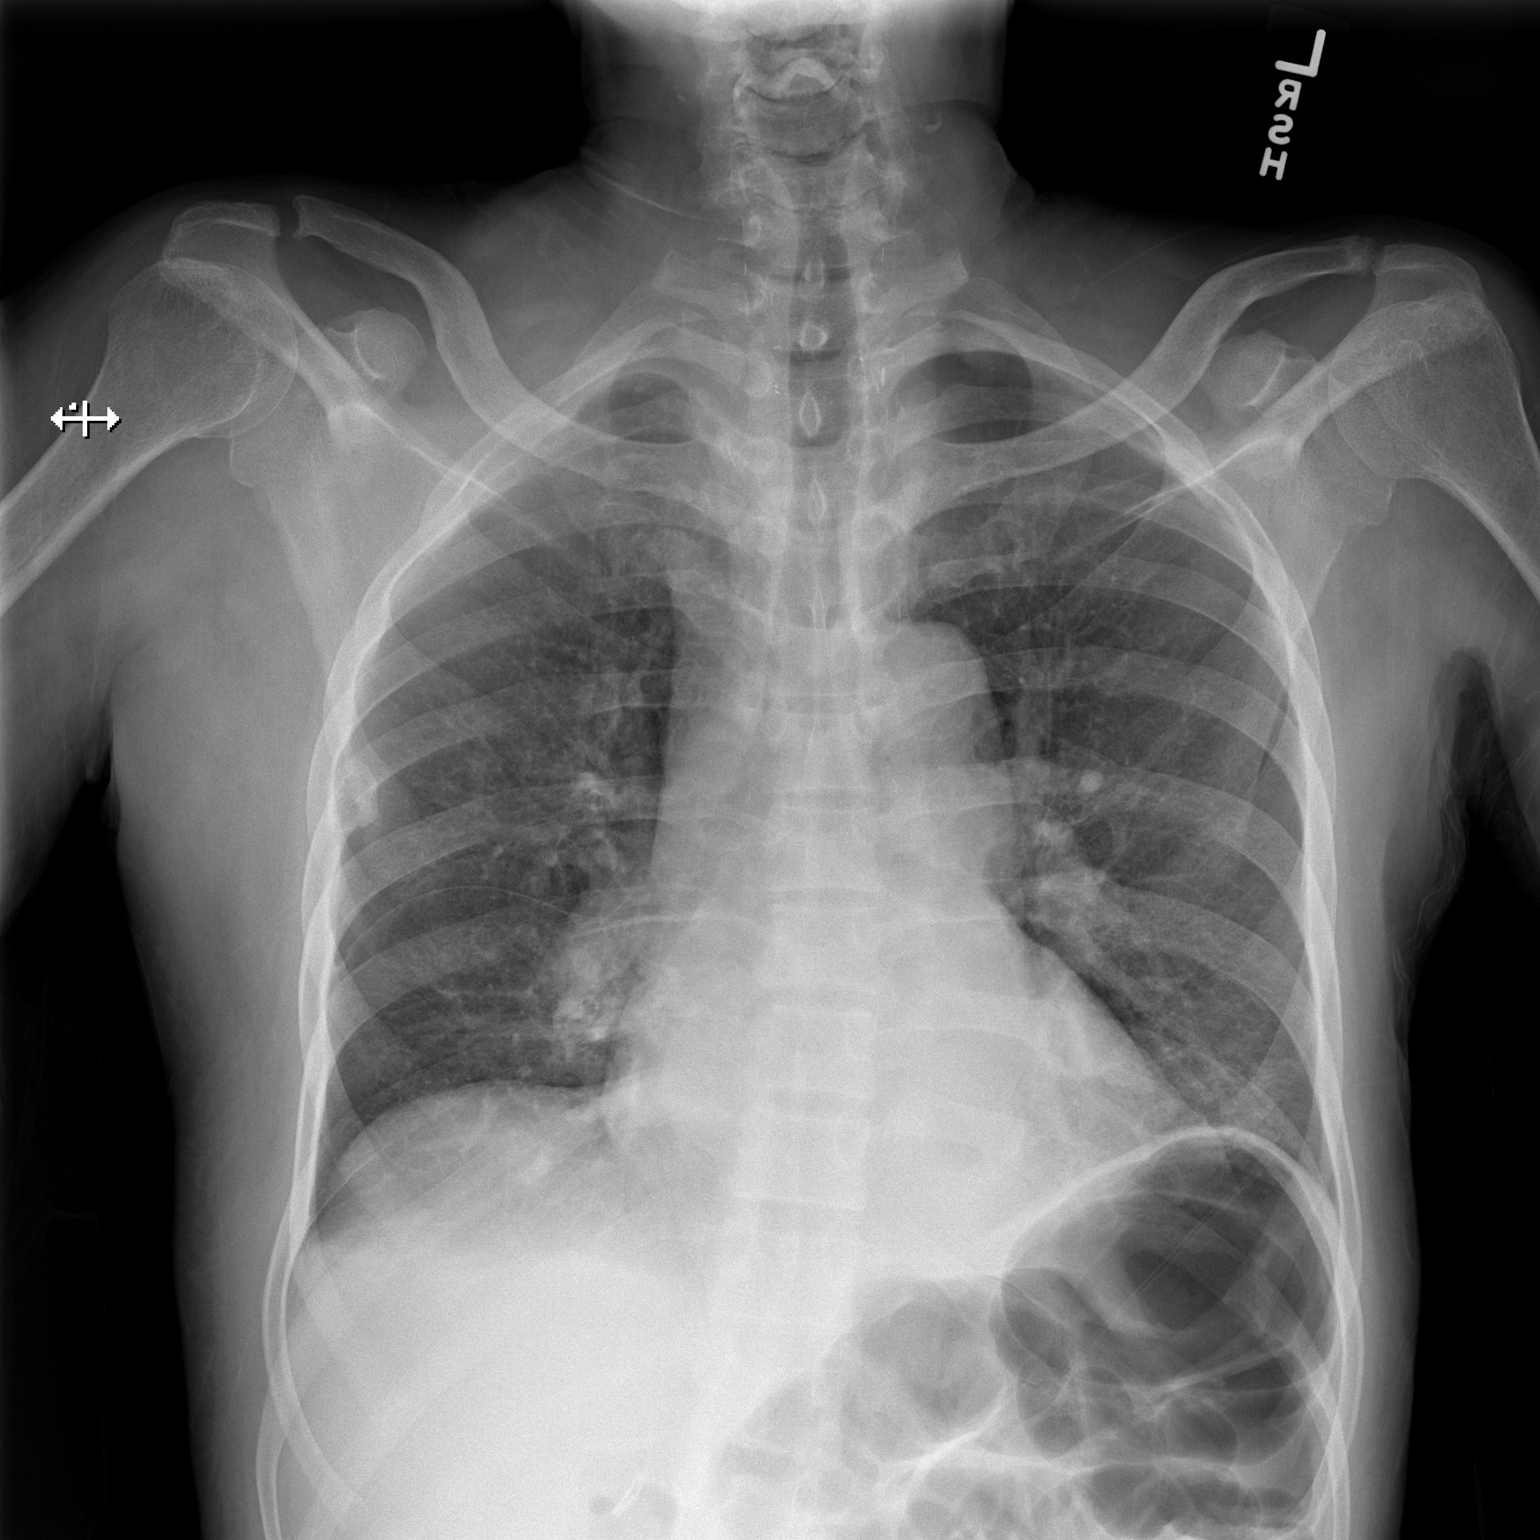

[w chest lat]
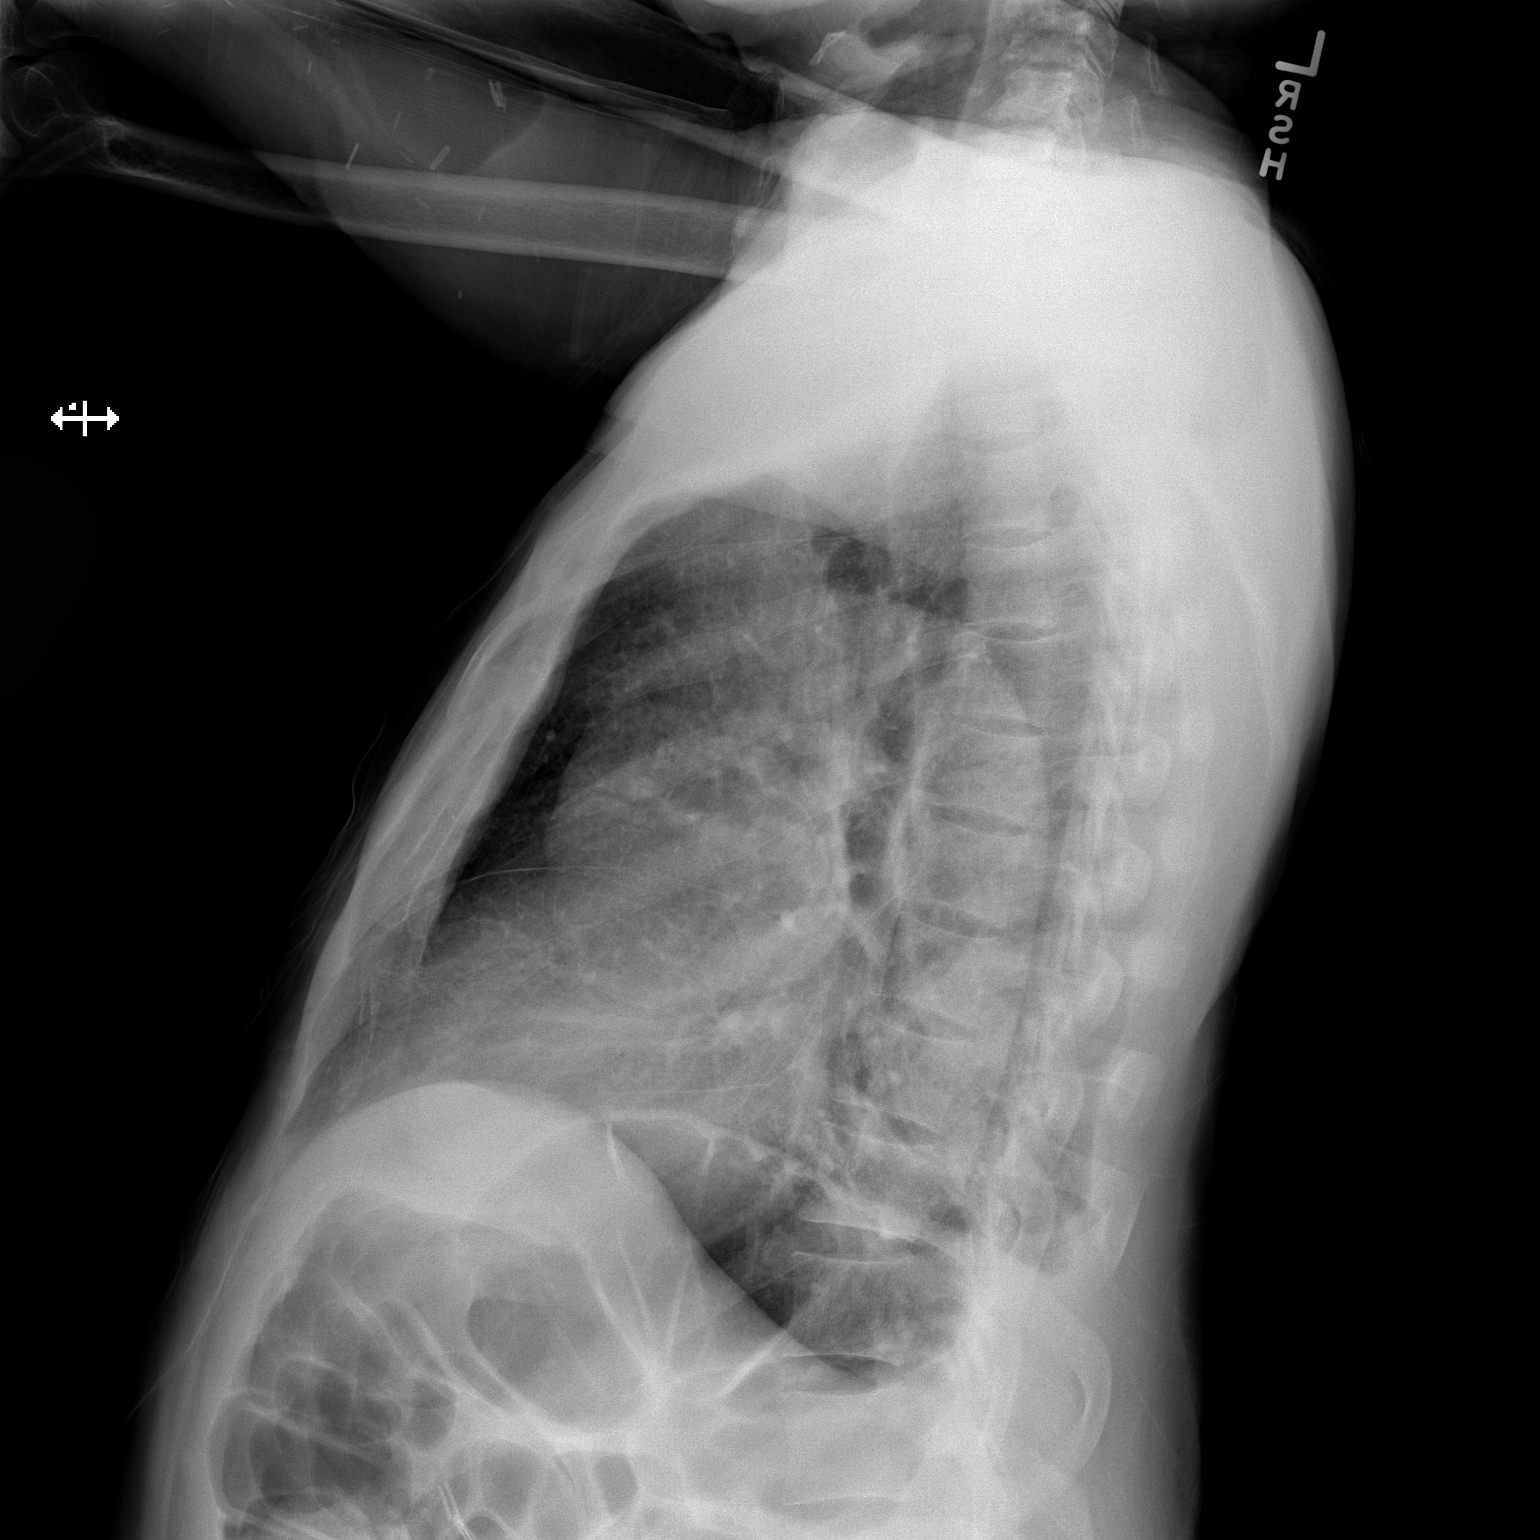

[2 of 2 positions shown; findings below may reference images not displayed]

FINDINGS: Enlargement of cardiac silhouette.

Mediastinal contours and pulmonary vascularity normal.

Atherosclerotic calcification aorta.

Mild bronchitic changes.

Lungs clear.

No infiltrate, pleural effusion or pneumothorax.

Old healed fracture lateral RIGHT sixth rib.
IMPRESSION: Mild enlargement of cardiac silhouette.

Bronchitic changes without infiltrate.
